# Patient Record
Sex: Male | Born: 1995 | Race: White | Hispanic: No | Marital: Single
Health system: Southern US, Community
[De-identification: ages and names within clinical notes are randomized; demographics above are authoritative.]

## PROBLEM LIST (undated history)

## (undated) DIAGNOSIS — R4189 Other symptoms and signs involving cognitive functions and awareness: Secondary | ICD-10-CM

## (undated) DIAGNOSIS — F909 Attention-deficit hyperactivity disorder, unspecified type: Secondary | ICD-10-CM

## (undated) DIAGNOSIS — F419 Anxiety disorder, unspecified: Secondary | ICD-10-CM

## (undated) DIAGNOSIS — I10 Essential (primary) hypertension: Secondary | ICD-10-CM

## (undated) DIAGNOSIS — F319 Bipolar disorder, unspecified: Secondary | ICD-10-CM

## (undated) DIAGNOSIS — S069X0S Unspecified intracranial injury without loss of consciousness, sequela: Secondary | ICD-10-CM

## (undated) DIAGNOSIS — R413 Other amnesia: Secondary | ICD-10-CM

## (undated) DIAGNOSIS — R569 Unspecified convulsions: Secondary | ICD-10-CM

## (undated) DIAGNOSIS — F068 Other specified mental disorders due to known physiological condition: Secondary | ICD-10-CM

## (undated) DIAGNOSIS — E119 Type 2 diabetes mellitus without complications: Secondary | ICD-10-CM

## (undated) DIAGNOSIS — R51 Headache: Secondary | ICD-10-CM

---
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
## 2015-01-22 ENCOUNTER — Encounter (HOSPITAL_COMMUNITY): Payer: Self-pay | Admitting: Emergency Medicine

## 2015-01-22 ENCOUNTER — Emergency Department (HOSPITAL_COMMUNITY)
Admission: EM | Admit: 2015-01-22 | Discharge: 2015-01-22 | Disposition: A | Discharge status: Home / Self Care | Payer: Medicaid Other | Attending: Emergency Medicine | Admitting: Emergency Medicine

## 2015-01-22 DIAGNOSIS — G40909 Epilepsy, unspecified, not intractable, without status epilepticus: Secondary | ICD-10-CM | POA: Insufficient documentation

## 2015-01-22 DIAGNOSIS — F319 Bipolar disorder, unspecified: Secondary | ICD-10-CM | POA: Diagnosis not present

## 2015-01-22 DIAGNOSIS — Z87820 Personal history of traumatic brain injury: Secondary | ICD-10-CM | POA: Diagnosis not present

## 2015-01-22 DIAGNOSIS — F419 Anxiety disorder, unspecified: Secondary | ICD-10-CM | POA: Insufficient documentation

## 2015-01-22 DIAGNOSIS — Z760 Encounter for issue of repeat prescription: Secondary | ICD-10-CM | POA: Diagnosis not present

## 2015-01-22 HISTORY — DX: Unspecified convulsions: R56.9

## 2015-01-22 MED ORDER — LEVETIRACETAM 500 MG PO TABS: 500.0000 mg | ORAL_TABLET | Freq: Two times a day (BID) | ORAL | Status: DC

## 2015-01-22 MED ORDER — PROPRANOLOL HCL 20 MG PO TABS: 20.0000 mg | ORAL_TABLET | Freq: Two times a day (BID) | ORAL | Status: DC

## 2015-01-22 MED ORDER — DIVALPROEX SODIUM 250 MG PO DR TAB: DELAYED_RELEASE_TABLET | ORAL | Status: DC

## 2015-01-22 NOTE — ED Provider Notes (Signed)
CSN: [id number]     Arrival date & time 01/22/15  1945 History   First MD Initiated Contact with Patient 01/22/15 2010     Chief Complaint  Patient presents with  . Medication Refill     (Consider location/radiation/quality/duration/timing/severity/associated sxs/prior Treatment) The history is provided by the patient and a parent.   Clinton [name] is a 19 y.o. 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  He recently turned 46 so had to switch from his pediatrician to an adult medical provider.  Mother states that his medicaid card issued him this month is wrong, listing a pediatric office he has never been to before.  She has fixed this problem but he will not receive his card for his new provider until 7/1 and in the interim, will run out of his depakote, his propranolol and his keppra tomorrow.  Mother states without these medicines he will definitely start having seizures.  He denies any complaints at this time.     Past Medical History  Diagnosis Date  . ADHD (attention deficit hyperactivity disorder)   . Anxiety   . Cognitive deficit as late effect of traumatic brain injury   . Memory loss, short term   . Headache(784.0)   . Bipolar disorder   . Seizures    Past Surgical History  Procedure Laterality Date  . Craniotomy  09/22/2013    Procedure: Decompressive Craniectomy with ICP monitor placement and bone flap placement into abdomen;  Surgeon: Erline Levine, MD;  Location: [place] [facility];  Service: Neurosurgery;;  Decompressive Craniectomy with ICP monitor placement and bone flap placement into abdomen  . Percutaneous tracheostomy N/A 10/02/2013    Procedure: PERCUTANEOUS TRACHEOSTOMY - BEDSIDE;  Surgeon: Gwenyth Ober, MD;  Location: [place];  Service: General;  Laterality: N/A;  . Esophagogastroduodenoscopy N/A 10/02/2013    Procedure: ESOPHAGOGASTRODUODENOSCOPY  (EGD);  Surgeon: Gwenyth Ober, MD;  Location: [facility] ENDOSCOPY;  Service: General;  Laterality: N/A;  . Peg placement N/A 10/02/2013    Procedure: PERCUTANEOUS ENDOSCOPIC GASTROSTOMY (PEG) PLACEMENT;  Surgeon: Gwenyth Ober, MD;  Location: [facility] END[facility]COPY;  Service: General;  Laterality: N/A;  . Cranioplasty Right 01/19/2014    Procedure: Right Cranioplasty with replacement of bone flap from abdomen;  Surgeon: Erline Levine, MD;  Location: [place] [facility];  Service: Neurosurgery;  Laterality: Right;  Right Cranioplasty with replacement of bone flap from abdomen   History reviewed. No pertinent family history. History  Substance Use Topics  . Smoking status: Current Every Day Smoker -- 0.50 packs/day for .3 years    Types: Cigarettes  . Smokeless tobacco: Never Used  . Alcohol Use: No    Review of Systems  Constitutional: Negative for fever and chills.  HENT: Negative.   Eyes: Negative.  Negative for visual disturbance.  Respiratory: Negative for chest tightness and shortness of breath.   Cardiovascular: Negative for chest pain.  Gastrointestinal: Negative.   Genitourinary: Negative.   Musculoskeletal: Negative.   Skin: Negative.  Negative for rash and wound.  Neurological: Negative for dizziness, weakness, light-headedness, numbness and headaches.  Psychiatric/Behavioral: Negative.       Allergies  Seroquel and Morphine and related  Home Medications   Prior to Admission medications   Medication Sig Start Date End Date Taking? Authorizing Provider  baclofen (LIORESAL) 10 MG tablet Take 1 tablet (10 mg total) by mouth 2 (two) times daily as needed  for muscle spasms. 08/17/14   Bayard Hugger, NP  divalproex (DEPAKOTE) 250 MG DR tablet Take 2 tabs by mouth AM and 3 tabs PM 01/22/15   Evalee Jefferson, PA-C  HYDROcodone-acetaminophen (NORCO/VICODIN) 5-325 MG per tablet Take 1 tablet by mouth every 8 (eight) hours as needed for moderate pain. Patient not taking: Reported on 08/17/2014 06/08/14   Bayard Hugger, NP  levETIRAcetam (KEPPRA) 500 MG tablet Take 1 tablet (500 mg total) by mouth 2 (two) times daily. 01/22/15   Evalee Jefferson, PA-C  LORazepam (ATIVAN) 0.5 MG tablet Take 1 tablet (0.5 mg total) by mouth every 12 (twelve) hours as needed for anxiety. 06/08/14   Bayard Hugger, NP  propranolol (INDERAL) 20 MG tablet Take 1 tablet (20 mg total) by mouth 2 (two) times daily. 01/22/15   Evalee Jefferson, PA-C   BP 94/73 mmHg  Pulse 80  Temp(Src) 98 F (36.7 C) (Oral)  Resp 16  Ht 6' (1.829 m)  Wt 170 lb (77.111 kg)  BMI 23.05 kg/m2  SpO2 100% Physical Exam  Constitutional: He is oriented to person, place, and time. He appears well-developed and well-nourished.  HENT:  Head: Normocephalic and atraumatic.  Eyes: Conjunctivae and EOM are normal. Pupils are equal, round, and reactive to light. No scleral icterus.  Neck: Normal range of motion.  Cardiovascular: Normal rate, regular rhythm and normal heart sounds.   Pulmonary/Chest: Effort normal and breath sounds normal.  Musculoskeletal: Normal range of motion.  Neurological: He is alert and oriented to person, place, and time.  Skin: Skin is warm and dry.  Psychiatric: He has a normal mood and affect.  Nursing note and vitals reviewed.   ED Course  Procedures (including critical care time) Labs Review Labs Reviewed - No data to display  Imaging Review No results found.   EKG Interpretation None      MDM   Final diagnoses:  Medication refill    Pt provided his medications including keppra, depakote and propanolol.  PRN f/u with his new provider [facility] (mother states they are opening a new clinic on [address] in [place]).  Prn here anticipated. Pt in no distress. No complaint of sx at this time.    Evalee Jefferson, PA-C 01/22/15 [place], DO 01/22/15 [id number]

## 2015-01-22 NOTE — Discharge Instructions (Signed)
Medication Refill, Emergency Department °We have refilled your medication today as a courtesy to you. It is best for your medical care, however, to take care of getting refills done through your primary caregiver's office. They have your records and can do a better job of follow-up than we can in the emergency department. °On maintenance medications, we often only prescribe enough medications to get you by until you are able to see your regular caregiver. This is a more expensive way to refill medications. °In the future, please plan for refills so that you will not have to use the emergency department for this. °Thank you for your help. Your help allows us to better take care of the daily emergencies that enter our department. °Document Released: 11/13/2003 Document Revised: 10/19/2011 Document Reviewed: 11/03/2013 °Exit[employer]® Patient Information ©2015 [employer]. This information is not intended to replace advice given to you by your health care provider. Make sure you discuss any questions you have with your health care provider. ° °

## 2015-01-22 NOTE — ED Notes (Signed)
Mother states patient takes keppra, propanolol, depakote daily. States they have been unable to get into doctor's office due to insurance issues. States he only has one day left of medication and if he does not take his medication, he will have seizures. States they will receive new insurance card on July 1.
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
## 2015-04-08 IMAGING — CR DG HAND COMPLETE 3+V*R*
5 series · 5 of 5 positions shown · non-contrast
Comparison: 09/21/2013

CLINICAL DATA: Posterior right hand swelling

EXAM:
RIGHT HAND - COMPLETE 3+ VIEW

[PA]
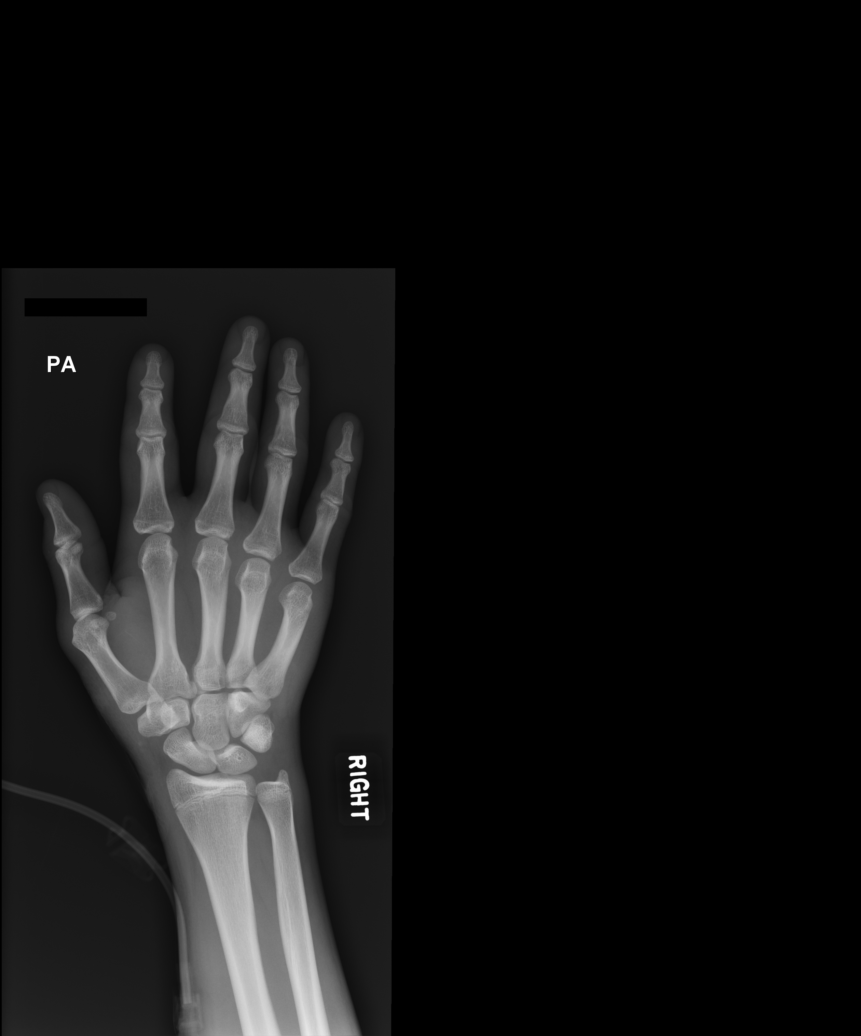

[lateral (1 of 2)]
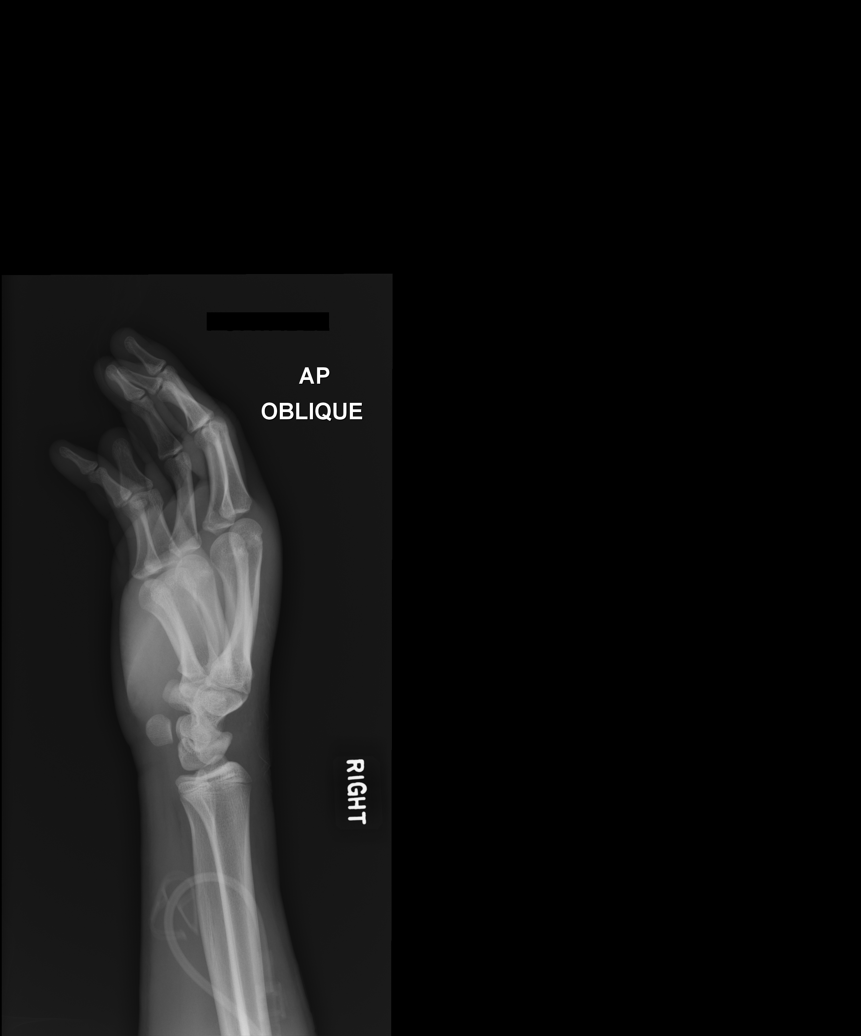

[pa obl (1 of 2)]
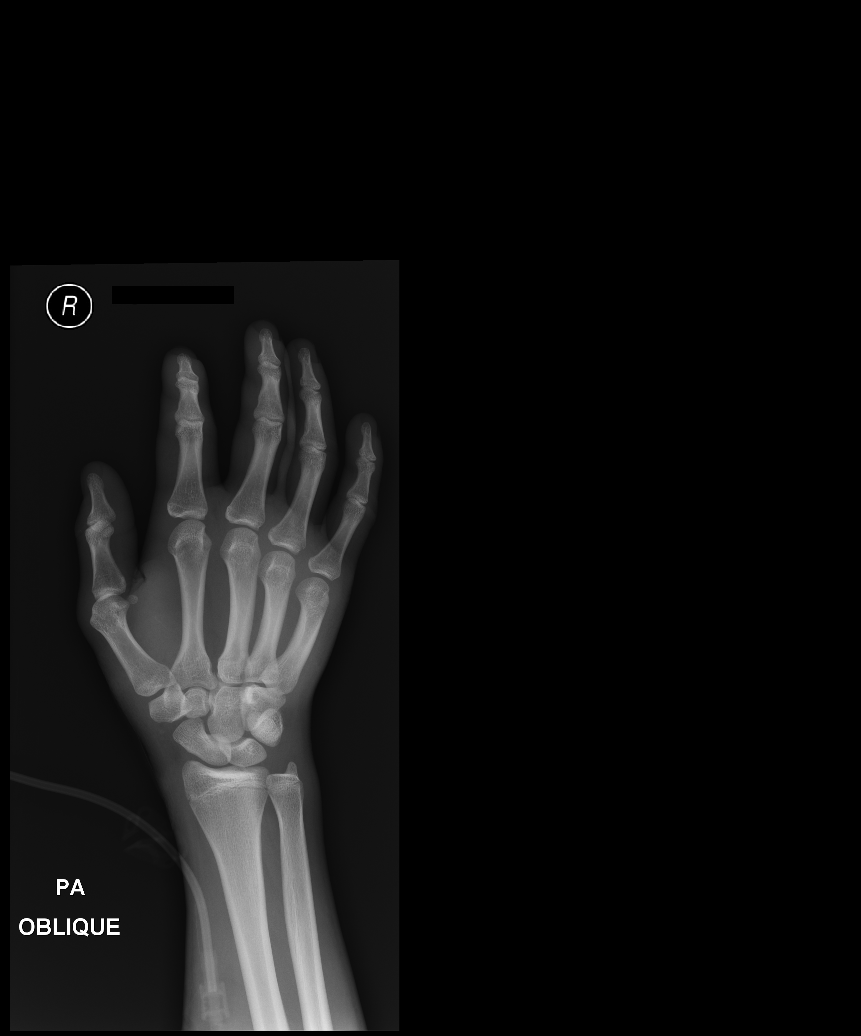

[lateral (2 of 2)]
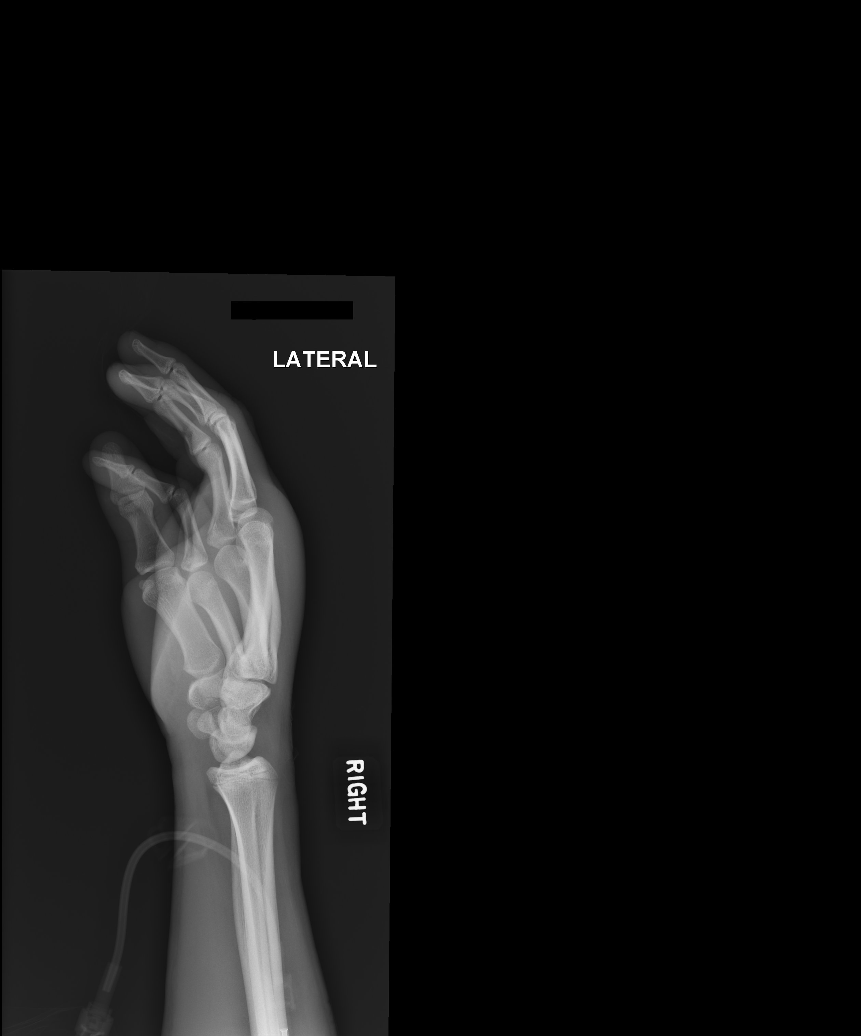

[pa obl (2 of 2)]
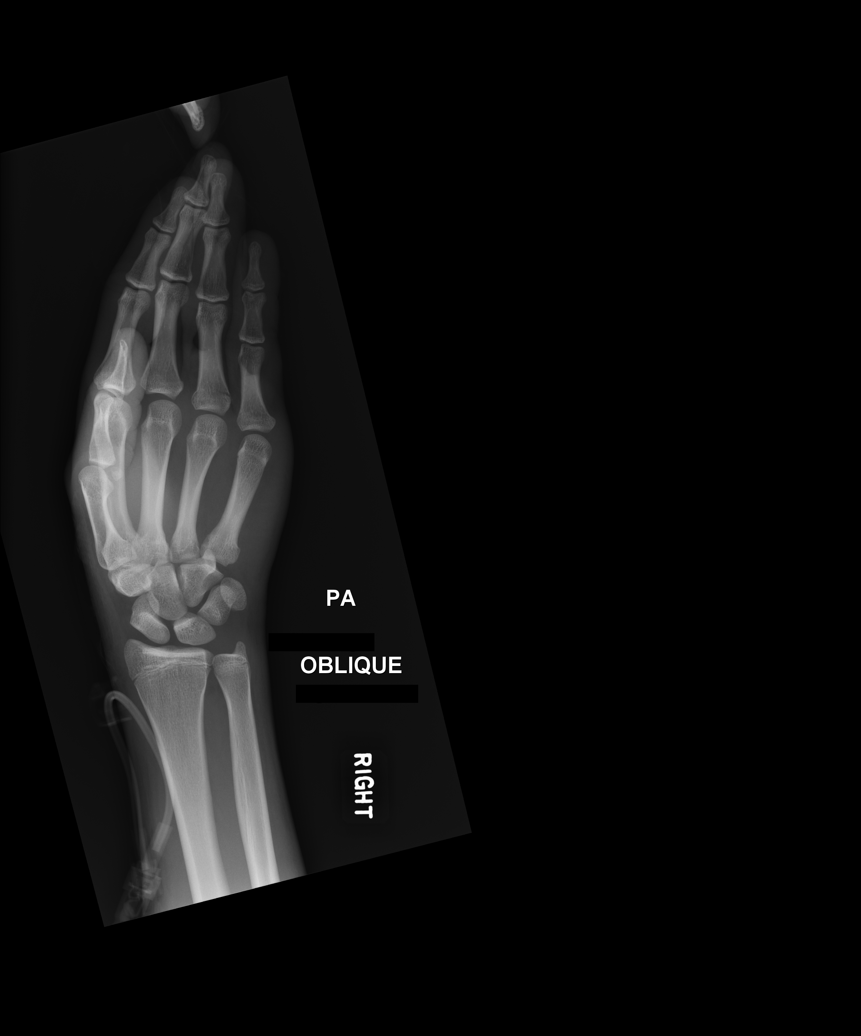

[5 of 5 positions shown; findings below may reference images not displayed]

FINDINGS: Nondisplaced fracture involving the base of the 4th proximal
phalanx, best visualized on the lateral view, unchanged.

No evidence of dislocation.

Moderate dorsal soft tissue swelling.
IMPRESSION: Nondisplaced fracture involving the base of the 4th proximal
phalanx.

No evidence of dislocation.

## 2015-05-16 ENCOUNTER — Telehealth: Payer: Self-pay | Admitting: *Deleted

## 2015-05-16 ENCOUNTER — Ambulatory Visit: Payer: Medicaid Other | Admitting: Neurology

## 2015-05-16 NOTE — Telephone Encounter (Signed)
No showed new patient appt 

## 2015-05-17 ENCOUNTER — Encounter: Payer: Self-pay | Admitting: Neurology

## 2015-07-03 ENCOUNTER — Emergency Department (HOSPITAL_COMMUNITY): Payer: Medicaid Other

## 2015-07-03 ENCOUNTER — Inpatient Hospital Stay (HOSPITAL_COMMUNITY)
Admission: EM | Admit: 2015-07-03 | Discharge: 2015-07-10 | DRG: 983 | Disposition: A | Discharge status: Home Health | Payer: Medicaid Other | Attending: General Surgery | Admitting: General Surgery

## 2015-07-03 ENCOUNTER — Inpatient Hospital Stay (HOSPITAL_COMMUNITY): Payer: Medicaid Other

## 2015-07-03 ENCOUNTER — Inpatient Hospital Stay (HOSPITAL_COMMUNITY): Payer: Medicaid Other | Admitting: Anesthesiology

## 2015-07-03 ENCOUNTER — Encounter (HOSPITAL_COMMUNITY): Admission: EM | Disposition: A | Discharge status: Home Health | Payer: Self-pay | Source: Home / Self Care

## 2015-07-03 ENCOUNTER — Encounter (HOSPITAL_COMMUNITY): Payer: Self-pay | Admitting: Emergency Medicine

## 2015-07-03 DIAGNOSIS — F319 Bipolar disorder, unspecified: Secondary | ICD-10-CM | POA: Diagnosis present

## 2015-07-03 DIAGNOSIS — R40241 Glasgow coma scale score 13-15, unspecified time: Secondary | ICD-10-CM | POA: Diagnosis present

## 2015-07-03 DIAGNOSIS — S065XAA Traumatic subdural hemorrhage with loss of consciousness status unknown, initial encounter: Secondary | ICD-10-CM

## 2015-07-03 DIAGNOSIS — R471 Dysarthria and anarthria: Secondary | ICD-10-CM | POA: Diagnosis present

## 2015-07-03 DIAGNOSIS — S42001A Fracture of unspecified part of right clavicle, initial encounter for closed fracture: Secondary | ICD-10-CM

## 2015-07-03 DIAGNOSIS — F909 Attention-deficit hyperactivity disorder, unspecified type: Secondary | ICD-10-CM | POA: Diagnosis not present

## 2015-07-03 DIAGNOSIS — F419 Anxiety disorder, unspecified: Secondary | ICD-10-CM | POA: Diagnosis not present

## 2015-07-03 DIAGNOSIS — S065X9A Traumatic subdural hemorrhage with loss of consciousness of unspecified duration, initial encounter: Principal | ICD-10-CM | POA: Diagnosis present

## 2015-07-03 DIAGNOSIS — S42009A Fracture of unspecified part of unspecified clavicle, initial encounter for closed fracture: Secondary | ICD-10-CM

## 2015-07-03 DIAGNOSIS — Z79899 Other long term (current) drug therapy: Secondary | ICD-10-CM

## 2015-07-03 DIAGNOSIS — F6381 Intermittent explosive disorder: Secondary | ICD-10-CM

## 2015-07-03 DIAGNOSIS — S42021A Displaced fracture of shaft of right clavicle, initial encounter for closed fracture: Secondary | ICD-10-CM | POA: Diagnosis present

## 2015-07-03 DIAGNOSIS — G3184 Mild cognitive impairment, so stated: Secondary | ICD-10-CM | POA: Diagnosis present

## 2015-07-03 DIAGNOSIS — Z885 Allergy status to narcotic agent status: Secondary | ICD-10-CM | POA: Diagnosis not present

## 2015-07-03 DIAGNOSIS — I6200 Nontraumatic subdural hemorrhage, unspecified: Secondary | ICD-10-CM | POA: Diagnosis present

## 2015-07-03 DIAGNOSIS — Z419 Encounter for procedure for purposes other than remedying health state, unspecified: Secondary | ICD-10-CM

## 2015-07-03 DIAGNOSIS — Z87820 Personal history of traumatic brain injury: Secondary | ICD-10-CM

## 2015-07-03 DIAGNOSIS — R001 Bradycardia, unspecified: Secondary | ICD-10-CM | POA: Diagnosis present

## 2015-07-03 DIAGNOSIS — R561 Post traumatic seizures: Secondary | ICD-10-CM

## 2015-07-03 DIAGNOSIS — F17210 Nicotine dependence, cigarettes, uncomplicated: Secondary | ICD-10-CM | POA: Diagnosis present

## 2015-07-03 DIAGNOSIS — R451 Restlessness and agitation: Secondary | ICD-10-CM | POA: Diagnosis not present

## 2015-07-03 DIAGNOSIS — Z418 Encounter for other procedures for purposes other than remedying health state: Secondary | ICD-10-CM

## 2015-07-03 DIAGNOSIS — S069X5S Unspecified intracranial injury with loss of consciousness greater than 24 hours with return to pre-existing conscious level, sequela: Secondary | ICD-10-CM

## 2015-07-03 LAB — CBC WITH DIFFERENTIAL/PLATELET
BASOS ABS: 0 10*3/uL (ref 0.0–0.1)
BASOS PCT: 0 %
Eosinophils Absolute: 0 10*3/uL (ref 0.0–0.7)
Eosinophils Relative: 0 %
HEMATOCRIT: 38.9 % — AB (ref 39.0–52.0)
HEMOGLOBIN: 13.3 g/dL (ref 13.0–17.0)
Lymphocytes Relative: 13 %
Lymphs Abs: 1.9 10*3/uL (ref 0.7–4.0)
MCH: 31.1 pg (ref 26.0–34.0)
MCHC: 34.2 g/dL (ref 30.0–36.0)
MCV: 91.1 fL (ref 78.0–100.0)
Monocytes Absolute: 0.9 10*3/uL (ref 0.1–1.0)
Monocytes Relative: 6 %
NEUTROS ABS: 11.8 10*3/uL — AB (ref 1.7–7.7)
NEUTROS PCT: 81 %
Platelets: 178 10*3/uL (ref 150–400)
RBC: 4.27 MIL/uL (ref 4.22–5.81)
RDW: 13.2 % (ref 11.5–15.5)
WBC: 14.6 10*3/uL — ABNORMAL HIGH (ref 4.0–10.5)

## 2015-07-03 LAB — PROTIME-INR
INR: 1.22 (ref 0.00–1.49)
Prothrombin Time: 15.6 seconds — ABNORMAL HIGH (ref 11.6–15.2)

## 2015-07-03 LAB — CBC
HCT: 35.4 % — ABNORMAL LOW (ref 39.0–52.0)
Hemoglobin: 11.8 g/dL — ABNORMAL LOW (ref 13.0–17.0)
MCH: 30.5 pg (ref 26.0–34.0)
MCHC: 33.3 g/dL (ref 30.0–36.0)
MCV: 91.5 fL (ref 78.0–100.0)
PLATELETS: 144 10*3/uL — AB (ref 150–400)
RBC: 3.87 MIL/uL — AB (ref 4.22–5.81)
RDW: 13.4 % (ref 11.5–15.5)
WBC: 10 10*3/uL (ref 4.0–10.5)

## 2015-07-03 LAB — MRSA PCR SCREENING: MRSA BY PCR: NEGATIVE

## 2015-07-03 LAB — BASIC METABOLIC PANEL
ANION GAP: 12 (ref 5–15)
ANION GAP: 10 (ref 5–15)
BUN: 20 mg/dL (ref 6–20)
BUN: 15 mg/dL (ref 6–20)
CALCIUM: 10.2 mg/dL (ref 8.9–10.3)
CO2: 28 mmol/L (ref 22–32)
CO2: 28 mmol/L (ref 22–32)
Calcium: 9.3 mg/dL (ref 8.9–10.3)
Chloride: 100 mmol/L — ABNORMAL LOW (ref 101–111)
Chloride: 101 mmol/L (ref 101–111)
Creatinine, Ser: 0.92 mg/dL (ref 0.61–1.24)
Creatinine, Ser: 0.88 mg/dL (ref 0.61–1.24)
GFR calc Af Amer: >60 mL/min (ref >60)
Glucose, Bld: 100 mg/dL — ABNORMAL HIGH (ref 65–99)
Glucose, Bld: 94 mg/dL (ref 65–99)
POTASSIUM: 4.4 mmol/L (ref 3.5–5.1)
Potassium: 4.3 mmol/L (ref 3.5–5.1)
SODIUM: 140 mmol/L (ref 135–145)
SODIUM: 139 mmol/L (ref 135–145)

## 2015-07-03 SURGERY — CRANIOTOMY HEMATOMA EVACUATION SUBDURAL
Anesthesia: General | Laterality: Left

## 2015-07-03 MED ORDER — LIDOCAINE HCL (CARDIAC) 20 MG/ML IV SOLN
INTRAVENOUS | Status: AC
  Filled 2015-07-03: qty 5

## 2015-07-03 MED ORDER — ARTIFICIAL TEARS OP OINT
TOPICAL_OINTMENT | OPHTHALMIC | Status: AC
Start: 2015-07-03 — End: 2015-07-03
  Filled 2015-07-03: qty 3.5

## 2015-07-03 MED ORDER — FENTANYL CITRATE (PF) 250 MCG/5ML IJ SOLN
INTRAMUSCULAR | Status: AC
  Filled 2015-07-03: qty 5

## 2015-07-03 MED ORDER — FENTANYL CITRATE (PF) 100 MCG/2ML IJ SOLN
50.0000 ug | Freq: Once | INTRAMUSCULAR | Status: AC
  Administered 2015-07-03: 50 ug via INTRAVENOUS
  Filled 2015-07-03: qty 2

## 2015-07-03 MED ORDER — DIVALPROEX SODIUM 500 MG PO DR TAB
750.0000 mg | DELAYED_RELEASE_TABLET | Freq: Every day | ORAL | Status: DC
  Administered 2015-07-03 – 2015-07-09 (×7): 750 mg via ORAL
  Filled 2015-07-03 (×9): qty 1

## 2015-07-03 MED ORDER — PROPRANOLOL HCL 20 MG PO TABS
20.0000 mg | ORAL_TABLET | Freq: Two times a day (BID) | ORAL | Status: DC
  Administered 2015-07-04 – 2015-07-10 (×11): 20 mg via ORAL
  Filled 2015-07-03 (×18): qty 1

## 2015-07-03 MED ORDER — ONDANSETRON HCL 4 MG/2ML IJ SOLN
4.0000 mg | Freq: Once | INTRAMUSCULAR | Status: AC
  Administered 2015-07-03: 4 mg via INTRAMUSCULAR
  Filled 2015-07-03: qty 2

## 2015-07-03 MED ORDER — PANTOPRAZOLE SODIUM 40 MG PO TBEC
40.0000 mg | DELAYED_RELEASE_TABLET | Freq: Every day | ORAL | Status: DC
  Administered 2015-07-04 – 2015-07-10 (×6): 40 mg via ORAL
  Filled 2015-07-03 (×6): qty 1

## 2015-07-03 MED ORDER — SODIUM CHLORIDE 0.9 % IV SOLN
INTRAVENOUS | Status: DC
  Administered 2015-07-03: 03:00:00 via INTRAVENOUS

## 2015-07-03 MED ORDER — FENTANYL CITRATE (PF) 100 MCG/2ML IJ SOLN
25.0000 ug | INTRAMUSCULAR | Status: DC | PRN
  Administered 2015-07-03 – 2015-07-06 (×5): 25 ug via INTRAVENOUS
  Filled 2015-07-03 (×5): qty 2

## 2015-07-03 MED ORDER — ONDANSETRON HCL 4 MG/2ML IJ SOLN
4.0000 mg | Freq: Four times a day (QID) | INTRAMUSCULAR | Status: DC | PRN
  Administered 2015-07-03 – 2015-07-08 (×9): 4 mg via INTRAVENOUS
  Filled 2015-07-03 (×9): qty 2

## 2015-07-03 MED ORDER — CHLORHEXIDINE GLUCONATE 0.12 % MT SOLN
15.0000 mL | Freq: Two times a day (BID) | OROMUCOSAL | Status: DC
  Administered 2015-07-03 – 2015-07-05 (×3): 15 mL via OROMUCOSAL
  Filled 2015-07-03 (×2): qty 15

## 2015-07-03 MED ORDER — PANTOPRAZOLE SODIUM 40 MG IV SOLR
40.0000 mg | Freq: Every day | INTRAVENOUS | Status: DC
  Administered 2015-07-03 – 2015-07-05 (×2): 40 mg via INTRAVENOUS
  Filled 2015-07-03 (×2): qty 40

## 2015-07-03 MED ORDER — LORAZEPAM 0.5 MG PO TABS
0.5000 mg | ORAL_TABLET | Freq: Two times a day (BID) | ORAL | Status: DC | PRN
  Administered 2015-07-03 – 2015-07-07 (×4): 0.5 mg via ORAL
  Filled 2015-07-03 (×4): qty 1

## 2015-07-03 MED ORDER — CETYLPYRIDINIUM CHLORIDE 0.05 % MT LIQD
7.0000 mL | Freq: Two times a day (BID) | OROMUCOSAL | Status: DC
  Administered 2015-07-03 – 2015-07-05 (×4): 7 mL via OROMUCOSAL

## 2015-07-03 MED ORDER — DIVALPROEX SODIUM 125 MG PO DR TAB
125.0000 mg | DELAYED_RELEASE_TABLET | Freq: Two times a day (BID) | ORAL | Status: DC
  Administered 2015-07-03: 125 mg via ORAL
  Filled 2015-07-03 (×2): qty 1

## 2015-07-03 MED ORDER — POTASSIUM CHLORIDE IN NACL 20-0.9 MEQ/L-% IV SOLN
INTRAVENOUS | Status: DC
  Administered 2015-07-03 – 2015-07-04 (×3): via INTRAVENOUS
  Filled 2015-07-03 (×5): qty 1000

## 2015-07-03 MED ORDER — DIVALPROEX SODIUM 500 MG PO DR TAB
500.0000 mg | DELAYED_RELEASE_TABLET | Freq: Every day | ORAL | Status: DC
  Administered 2015-07-04 – 2015-07-10 (×7): 500 mg via ORAL
  Filled 2015-07-03 (×10): qty 1

## 2015-07-03 MED ORDER — IOHEXOL 300 MG/ML  SOLN
75.0000 mL | Freq: Once | INTRAMUSCULAR | Status: AC | PRN
  Administered 2015-07-03: 75 mL via INTRAVENOUS

## 2015-07-03 MED ORDER — NEOSTIGMINE METHYLSULFATE 10 MG/10ML IV SOLN
INTRAVENOUS | Status: AC
  Filled 2015-07-03: qty 3

## 2015-07-03 MED ORDER — ACETAMINOPHEN 325 MG PO TABS
650.0000 mg | ORAL_TABLET | ORAL | Status: DC | PRN
  Administered 2015-07-04 – 2015-07-09 (×6): 650 mg via ORAL
  Filled 2015-07-03 (×6): qty 2

## 2015-07-03 MED ORDER — LEVETIRACETAM 500 MG PO TABS
500.0000 mg | ORAL_TABLET | Freq: Two times a day (BID) | ORAL | Status: DC
  Administered 2015-07-03 – 2015-07-10 (×15): 500 mg via ORAL
  Filled 2015-07-03 (×15): qty 1

## 2015-07-03 MED ORDER — ONDANSETRON HCL 4 MG PO TABS
4.0000 mg | ORAL_TABLET | Freq: Four times a day (QID) | ORAL | Status: DC | PRN
  Administered 2015-07-09 – 2015-07-10 (×2): 4 mg via ORAL
  Filled 2015-07-03 (×2): qty 1

## 2015-07-03 MED ORDER — PROPOFOL 10 MG/ML IV BOLUS
INTRAVENOUS | Status: AC
  Filled 2015-07-03: qty 20
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
## 2015-07-03 NOTE — Anesthesia Preprocedure Evaluation (Deleted)
Anesthesia Evaluation    History of Anesthesia Complications Negative for: history of anesthetic complications  Airway        Dental   Pulmonary Current Smoker,           Cardiovascular negative cardio ROS       Neuro/Psych Seizures -,  PSYCHIATRIC DISORDERS Anxiety Bipolar Disorder    GI/Hepatic negative GI ROS, Neg liver ROS,   Endo/Other  negative endocrine ROS  Renal/GU negative Renal ROS     Musculoskeletal   Abdominal   Peds  Hematology   Anesthesia Other Findings   Reproductive/Obstetrics                             Anesthesia Physical Anesthesia Plan  ASA: III and emergent  Anesthesia Plan: General   Post-op Pain Management:    Induction: Intravenous  Airway Management Planned: Oral ETT  Additional Equipment:   Intra-op Plan:   Post-operative Plan: Post-operative intubation/ventilation  Informed Consent:   Plan Discussed with:   Anesthesia Plan Comments:         Anesthesia Quick Evaluation
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
## 2015-07-03 NOTE — Progress Notes (Signed)
Utilization review completed. Sherrian Nunnelley, RN, BSN. 

## 2015-07-03 NOTE — Progress Notes (Signed)
Patient somnolent, arouses with vigorous stimulation and swears.  I have reviewed patient's imaging and his clinical situation.  I agree with Dr.  Arnoldo Morale that patient should have craniotomy for SDH.  Social work consult and home safety evaluation would also be appropriate.

## 2015-07-03 NOTE — ED Notes (Signed)
Mother Contact numberLondyn [name] [phone].

## 2015-07-03 NOTE — Progress Notes (Addendum)
SLP Cancellation Note  Patient Details Name: Clinton [name] MRN: [id number] DOB: [birth year]   Cancelled treatment:       Reason Eval/Treat Not Completed: Medical issues which prohibited therapy. Note plans for craniotomy. Would recommend holding evaluation until after surgery.   [place], CCC-SLP [phone]    Gabriel Rainwater Meryl 07/03/2015, 9:59 AM

## 2015-07-03 NOTE — Progress Notes (Signed)
Dr. Trenton Gammon notified of patient's arrival. RN will monitor.
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
## 2015-07-03 NOTE — Progress Notes (Signed)
PT Cancellation Note  Patient Details Name: Clinton [name] MRN: [id number] DOB: 31-May-1996   Cancelled Treatment:    Reason Eval/Treat Not Completed: Patient not medically ready.  Noted plan for Crani and repair of R Clavicle fx.  Will hold PT eval at this time and f/u as appropriate.  Will need new orders and activity order post Crani.     Catarina Hartshorn, [place] 07/03/2015, 9:38 AM

## 2015-07-03 NOTE — Progress Notes (Signed)
I spoke with the patient's mother and stepfather. The patient has improved clinically since his admission. I have recommended we continue to observe him and plan to repeat his CAT scan this afternoon. If the patient remains clinically stable and his CAT scan is unchanged we will plan continued observation. If the patient worsens neurologically or the CAT scans worsen then we will likely do surgery. I have answered all their questions.

## 2015-07-03 NOTE — Progress Notes (Signed)
OT Cancellation Note  Patient Details Name: Clinton [name] MRN: [id number] DOB: 1996-05-24   Cancelled Treatment:    Reason Eval/Treat Not Completed: Patient not medically ready. Pt expected to undergo craniotomy and R clavicle ORIF. Will follow.  Malka So 07/03/2015, 10:08 AM
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
## 2015-07-04 NOTE — Progress Notes (Signed)
Patient will not keep blood pressure cuff on and removes it before a blood pressure can be completed. Have gotten blood pressure readings as the patient allows.

## 2015-07-04 NOTE — Progress Notes (Signed)
Patient agitated, yelling at RN and getting out of bed more frequently without assistance. Mother at bedside and unable to calm patient down. MD Kinsinger notified. Orders received for a Posey belt and to obatin a CT in the AM as ordered.
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
## 2015-07-04 NOTE — Progress Notes (Signed)
Patient ID: Clinton [name], male   DOB: [birth year], 19 y.o.   MRN: [id number] He is awake and alert. He follows commands. He moves all extremities well. He will stand up at the side of bed to urinate. He has some mild dysarthria which is probably baseline. Mild left facial dysfunction which is probably baseline. Overall he appears to be doing well. Repeat CT tomorrow.

## 2015-07-05 ENCOUNTER — Inpatient Hospital Stay (HOSPITAL_COMMUNITY): Payer: Medicaid Other

## 2015-07-05 MED ORDER — PROMETHAZINE HCL 25 MG PO TABS
12.5000 mg | ORAL_TABLET | Freq: Four times a day (QID) | ORAL | Status: DC | PRN
  Administered 2015-07-05: 12.5 mg via ORAL
  Filled 2015-07-05: qty 1

## 2015-07-05 NOTE — Progress Notes (Signed)
Text paged MD Kinsinger that CT head results are in

## 2015-07-05 NOTE — Progress Notes (Signed)
Patient ID: Clinton [name], male   DOB: [birth year], 19 y.o.   MRN: [id number] Patient is resting comfortably. He arouses easily and follows commands. He denies significant headache. He moves all extremities. Head CT looks stable to me with stable left subdural hematoma and left temporal contusion

## 2015-07-05 NOTE — Progress Notes (Signed)
Patient extremely agitated at this time and unable to participate in exam. Note plans for surgery next week.  Will follow up Monday to complete formal exam.
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
## 2015-07-05 NOTE — Progress Notes (Signed)
Patient ID: Clinton [name], male   DOB: Jan 17, 1996, 19 y.o.   MRN: [id number]  Mother at bedside. Because of his agitation, she wants her son to stay in the ICU which I feel is totally reasonable given his previous brain injury as well.
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
## 2015-07-05 NOTE — Progress Notes (Signed)
Rehab Admissions Coordinator Note:  Patient was screened by Retta Diones for appropriateness for an Inpatient Acute Rehab Consult.  At this time, we are recommending Inpatient Rehab consult.  Retta Diones 07/05/2015, 1:49 PM  I can be reached at [phone].

## 2015-07-06 MED ORDER — LORAZEPAM 1 MG PO TABS
1.0000 mg | ORAL_TABLET | ORAL | Status: DC | PRN
  Administered 2015-07-06 – 2015-07-10 (×7): 1 mg via ORAL
  Filled 2015-07-06 (×8): qty 1

## 2015-07-06 MED ORDER — OXYCODONE HCL 5 MG PO TABS
10.0000 mg | ORAL_TABLET | ORAL | Status: DC | PRN
Start: 2015-07-06 — End: 2015-07-10
  Administered 2015-07-07 – 2015-07-10 (×9): 10 mg via ORAL
  Filled 2015-07-06 (×11): qty 2

## 2015-07-06 NOTE — Progress Notes (Signed)
Patient admitted to 5M13. Patient is oriented to self, will not answer other questions regarding orientation. Laceration to head is closed, dry, intact. No restraints present. Patient refuses to wear SCDs. Sitter at bedside, patient is resting quietly in bed. Will continue to monitor closely.

## 2015-07-06 NOTE — Progress Notes (Signed)
Trauma Service Note  Subjective: Continues to be uncooperative.  Objective: Vital signs in last 24 hours: Temp:  [97.5 F (36.4 C)-98.5 F (36.9 C)] 97.5 F (36.4 C) (11/26 0800) Pulse Rate:  [47-75] 47 (11/26 0800) Resp:  [14-21] 19 (11/26 0800) BP: (102-127)/(52-58) 102/55 mmHg (11/26 0800) SpO2:  [95 %-100 %] 95 % (11/26 0800)    Intake/Output from previous day: 11/25 0701 - 11/26 0700 In: 600 [P.O.:600] Out: 275 [Urine:275] Intake/Output this shift: Total I/O In: -  Out: 50 [Urine:50]  General: NAD  Lungs: CTAB  Abd: soft, NT, ND  Extremities: moves all extremities  Neuro: AOx4, some slurred speech  Lab Results: CBC  No results for input(s): WBC, HGB, HCT, PLT in the last 72 hours. BMET No results for input(s): NA, K, CL, CO2, GLUCOSE, BUN, CREATININE, CALCIUM in the last 72 hours. PT/INR No results for input(s): LABPROT, INR in the last 72 hours. ABG No results for input(s): PHART, HCO3 in the last 72 hours.  Invalid input(s): PCO2, PO2  Studies/Results: No results found.  Anti-infectives: Anti-infectives    None      Medications Scheduled Meds: . antiseptic oral rinse  7 mL Mouth Rinse q12n4p  . chlorhexidine  15 mL Mouth Rinse BID  . divalproex  500 mg Oral Daily   And  . divalproex  750 mg Oral QHS  . levETIRAcetam  500 mg Oral BID  . pantoprazole  40 mg Oral Daily  . propranolol  20 mg Oral BID   Continuous Infusions:  PRN Meds:.acetaminophen, fentaNYL (SUBLIMAZE) injection, LORazepam, ondansetron **OR** ondansetron (ZOFRAN) IV, promethazine  Assessment/Plan: s/p Procedure(s): CRANIOTOMY HEMATOMA EVACUATION  -transfer to floor -advance diet -window open during day -up to chair with assistance   LOS: 3 days   Silvana Surgeon [phone] Surgery 07/06/2015

## 2015-07-06 NOTE — Progress Notes (Signed)
Patient ID: Clinton [name], male   DOB: 1996-02-27, 19 y.o.   MRN: [id number] He is stable. He arouses easily and answers questions. He moves all extremities. Will transfer to the floor today.

## 2015-07-06 NOTE — Progress Notes (Signed)
Spoke with mother at length to update her on his condition. Discussed that due to 48h of critical monitoring without neuro changes and CT done 11/25 showing no change from previous that he is appropriate for floor and should start with increased therapy.  She stated she felt the neurosurgeon today was short and feels that she is not in the loop.   I spoke with Dr. Percell Miller of ortho and patient is still scheduled for surgery 11/29, but could be done as outpatient if meets criteria for discharge.

## 2015-07-07 NOTE — Progress Notes (Signed)
4 Days Post-Op  Subjective: Awake and alert. Complains of pain right clavicle area  Objective: Vital signs in last 24 hours: Temp:  [97.7 F (36.5 C)-98.6 F (37 C)] 97.8 F (36.6 C) (11/27 0536) Pulse Rate:  [52-101] 101 (11/27 0536) Resp:  [18-20] 20 (11/27 0536) BP: (104-135)/(53-80) 107/69 mmHg (11/27 0536) SpO2:  [98 %-100 %] 100 % (11/27 0536)    Intake/Output from previous day: 11/26 0701 - 11/27 0700 In: 110 [P.O.:110] Out: 50 [Urine:50] Intake/Output this shift:    Resp: clear to auscultation bilaterally Cardio: regular rate and rhythm GI: soft, nontender Neurologic: Motor: moves all 4 extr and follows commands  Lab Results:  No results for input(s): WBC, HGB, HCT, PLT in the last 72 hours. BMET No results for input(s): NA, K, CL, CO2, GLUCOSE, BUN, CREATININE, CALCIUM in the last 72 hours. PT/INR No results for input(s): LABPROT, INR in the last 72 hours. ABG No results for input(s): PHART, HCO3 in the last 72 hours.  Invalid input(s): PCO2, PO2  Studies/Results: No results found.  Anti-infectives: Anti-infectives    None      Assessment/Plan: s/p Procedure(s): CRANIOTOMY HEMATOMA EVACUATION  (Left) SDH per NSU  Clavicle fx per ortho PT  LOS: 4 days    TOTH [name],Clinton Cockerell S 07/07/2015
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
## 2015-07-07 NOTE — Progress Notes (Signed)
Patient ID: Clinton [name], male   DOB: February 14, 1996, 19 y.o.   MRN: [id number] Patient awakens easily to voice responds appropriately neurologically nonfocal  Continue mobilization with therapy

## 2015-07-08 DIAGNOSIS — S42001A Fracture of unspecified part of right clavicle, initial encounter for closed fracture: Secondary | ICD-10-CM | POA: Diagnosis present
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
## 2015-07-08 NOTE — Progress Notes (Signed)
Patient ID: Clinton [name], male   DOB: May 25, 1996, 19 y.o.   MRN: [id number]   LOS: 5 days   Subjective: No c/o, impulsive   Objective: Vital signs in last 24 hours: Temp:  [97.7 F (36.5 C)-98.6 F (37 C)] 98.2 F (36.8 C) (11/28 0603) Pulse Rate:  [64-80] 80 (11/28 0603) Resp:  [17-18] 18 (11/28 0603) BP: (99-109)/(49-91) 109/91 mmHg (11/28 0603) SpO2:  [99 %-100 %] 100 % (11/28 0603)    Physical Exam General appearance: alert and no distress Resp: clear to auscultation bilaterally Cardio: regular rate and rhythm GI: normal findings: bowel sounds normal and soft, non-tender   Assessment/Plan: BCC TBI/L SDH-appreciate NSU follow up.Initiallly recommending craniotomy, mother initially refusing, then agreeable but as pt improving not done. TBI team. Family/pt notes trouble hearing, will need OP f/u with ENT Right clavicle fracture-Dr. Percell Miller following, recommend surgery, planned for tomorrow Hx severe TBI 2015 FEN - No issues VTE - SCD's Dispo -- Recommending CIR, unsure if that's a possibility    Lisette Abu, PA-C Pager: [phone] General Trauma PA Pager: [phone]  07/08/2015
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
## 2015-07-08 NOTE — Progress Notes (Signed)
Patient ID: Clinton [name], male   DOB: 04/01/1996, 19 y.o.   MRN: [id number] Subjective:  The patient is alert and pleasant. His mother is at the bedside. According to her the patient is at his baseline. He is scheduled for collarbone surgery tomorrow.  Objective: Vital signs in last 24 hours: Temp:  [98.2 F (36.8 C)-98.6 F (37 C)] 98.4 F (36.9 C) (11/28 1012) Pulse Rate:  [64-82] 82 (11/28 1012) Resp:  [17-18] 18 (11/28 1012) BP: (99-116)/(53-91) 116/78 mmHg (11/28 1012) SpO2:  [100 %] 100 % (11/28 1012)  Intake/Output from previous day:   Intake/Output this shift: Total I/O In: 1200 [P.O.:1200] Out: -   Physical exam the patient is alert and oriented 3. He is moving all 4 extremities. He is pleasant.  Lab Results: No results for input(s): WBC, HGB, HCT, PLT in the last 72 hours. BMET No results for input(s): NA, K, CL, CO2, GLUCOSE, BUN, CREATININE, CALCIUM in the last 72 hours.  Studies/Results: No results found.  Assessment/Plan: Left subdural hematoma, left temporal contusion, traumatic brain injury: The patient is neurologically stable. From my point of view he can be discharged and follow-up with me in the office in a week or 2. I discussed situation with the patient and his mother and answered all their questions.  LOS: 5 days     Clinton [name] D 07/08/2015, 6:37 PM
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
## 2015-07-08 NOTE — Progress Notes (Signed)
I met with pt, his Mom and his Dad at bedside. I discussed our recommendation of OP therapy at this level. They prefer Home health because Mom is legally blind and Father works so transportation is an issue. I have discussed with Dr. Naaman Plummer. I will update RN CM to arrange. [phone]

## 2015-07-09 ENCOUNTER — Inpatient Hospital Stay (HOSPITAL_COMMUNITY): Payer: Medicaid Other | Admitting: Anesthesiology

## 2015-07-09 ENCOUNTER — Encounter (HOSPITAL_COMMUNITY): Payer: Self-pay | Admitting: Certified Registered Nurse Anesthetist

## 2015-07-09 ENCOUNTER — Inpatient Hospital Stay (HOSPITAL_COMMUNITY): Payer: Medicaid Other

## 2015-07-09 ENCOUNTER — Encounter (HOSPITAL_COMMUNITY): Admission: EM | Disposition: A | Discharge status: Home Health | Payer: Self-pay | Source: Home / Self Care

## 2015-07-09 HISTORY — PX: ORIF CLAVICULAR FRACTURE: SHX5055

## 2015-07-09 SURGERY — OPEN REDUCTION INTERNAL FIXATION (ORIF) CLAVICULAR FRACTURE
Anesthesia: General | Laterality: Right

## 2015-07-09 MED ORDER — HYDROMORPHONE HCL 1 MG/ML IJ SOLN
INTRAMUSCULAR | Status: AC
  Administered 2015-07-09: 0.5 mg via INTRAVENOUS
  Filled 2015-07-09: qty 1

## 2015-07-09 MED ORDER — CEFAZOLIN SODIUM-DEXTROSE 2-3 GM-% IV SOLR
2.0000 g | INTRAVENOUS | Status: AC
  Administered 2015-07-09: 2 g via INTRAVENOUS

## 2015-07-09 MED ORDER — OXYCODONE HCL 5 MG/5ML PO SOLN: 5.0000 mg | Freq: Once | ORAL | Status: DC | PRN

## 2015-07-09 MED ORDER — MIDAZOLAM HCL 2 MG/2ML IJ SOLN
INTRAMUSCULAR | Status: AC
  Filled 2015-07-09: qty 2

## 2015-07-09 MED ORDER — PROPOFOL 10 MG/ML IV BOLUS
INTRAVENOUS | Status: DC | PRN
  Administered 2015-07-09: 50 mg via INTRAVENOUS
  Administered 2015-07-09: 100 mg via INTRAVENOUS

## 2015-07-09 MED ORDER — PROMETHAZINE HCL 25 MG/ML IJ SOLN: 6.2500 mg | INTRAMUSCULAR | Status: DC | PRN

## 2015-07-09 MED ORDER — LACTATED RINGERS IV SOLN: INTRAVENOUS | Status: DC

## 2015-07-09 MED ORDER — BUPIVACAINE HCL (PF) 0.5 % IJ SOLN
INTRAMUSCULAR | Status: DC | PRN
  Administered 2015-07-09: 30 mL

## 2015-07-09 MED ORDER — FENTANYL CITRATE (PF) 250 MCG/5ML IJ SOLN
INTRAMUSCULAR | Status: AC
  Filled 2015-07-09: qty 5

## 2015-07-09 MED ORDER — BUPIVACAINE HCL (PF) 0.5 % IJ SOLN
INTRAMUSCULAR | Status: AC
  Filled 2015-07-09: qty 30

## 2015-07-09 MED ORDER — GLYCOPYRROLATE 0.2 MG/ML IJ SOLN
INTRAMUSCULAR | Status: AC
  Filled 2015-07-09: qty 2

## 2015-07-09 MED ORDER — LACTATED RINGERS IV SOLN
INTRAVENOUS | Status: DC | PRN
  Administered 2015-07-09 (×2): via INTRAVENOUS

## 2015-07-09 MED ORDER — 0.9 % SODIUM CHLORIDE (POUR BTL) OPTIME
TOPICAL | Status: DC | PRN
  Administered 2015-07-09: 1000 mL

## 2015-07-09 MED ORDER — BUPIVACAINE-EPINEPHRINE (PF) 0.5% -1:200000 IJ SOLN
INTRAMUSCULAR | Status: AC
  Filled 2015-07-09: qty 30

## 2015-07-09 MED ORDER — ONDANSETRON HCL 4 MG/2ML IJ SOLN
INTRAMUSCULAR | Status: DC | PRN
  Administered 2015-07-09: 4 mg via INTRAVENOUS

## 2015-07-09 MED ORDER — CEFAZOLIN SODIUM-DEXTROSE 2-3 GM-% IV SOLR
INTRAVENOUS | Status: AC
  Filled 2015-07-09: qty 50

## 2015-07-09 MED ORDER — GLYCOPYRROLATE 0.2 MG/ML IJ SOLN
INTRAMUSCULAR | Status: DC | PRN
  Administered 2015-07-09: 0.4 mg via INTRAVENOUS

## 2015-07-09 MED ORDER — POTASSIUM CHLORIDE IN NACL 20-0.45 MEQ/L-% IV SOLN
INTRAVENOUS | Status: DC
  Filled 2015-07-09: qty 1000

## 2015-07-09 MED ORDER — OXYCODONE HCL 5 MG PO TABS: 5.0000 mg | ORAL_TABLET | Freq: Once | ORAL | Status: DC | PRN

## 2015-07-09 MED ORDER — NEOSTIGMINE METHYLSULFATE 10 MG/10ML IV SOLN
INTRAVENOUS | Status: DC | PRN
  Administered 2015-07-09: 3 mg via INTRAVENOUS

## 2015-07-09 MED ORDER — ACETAMINOPHEN 500 MG PO TABS: 1000.0000 mg | ORAL_TABLET | Freq: Once | ORAL | Status: DC

## 2015-07-09 MED ORDER — LACTATED RINGERS IV SOLN
INTRAVENOUS | Status: DC
  Administered 2015-07-09: 10:00:00 via INTRAVENOUS

## 2015-07-09 MED ORDER — CHLORHEXIDINE GLUCONATE 4 % EX LIQD: 60.0000 mL | Freq: Once | CUTANEOUS | Status: DC

## 2015-07-09 MED ORDER — FENTANYL CITRATE (PF) 100 MCG/2ML IJ SOLN
INTRAMUSCULAR | Status: DC | PRN
  Administered 2015-07-09 (×2): 50 ug via INTRAVENOUS

## 2015-07-09 MED ORDER — ONDANSETRON HCL 4 MG/2ML IJ SOLN
INTRAMUSCULAR | Status: AC
  Filled 2015-07-09: qty 2

## 2015-07-09 MED ORDER — LIDOCAINE HCL (CARDIAC) 20 MG/ML IV SOLN
INTRAVENOUS | Status: DC | PRN
  Administered 2015-07-09: 60 mg via INTRAVENOUS

## 2015-07-09 MED ORDER — DOCUSATE SODIUM 100 MG PO CAPS
100.0000 mg | ORAL_CAPSULE | Freq: Two times a day (BID) | ORAL | Status: DC
  Administered 2015-07-09 – 2015-07-10 (×2): 100 mg via ORAL
  Filled 2015-07-09 (×2): qty 1

## 2015-07-09 MED ORDER — ROCURONIUM BROMIDE 100 MG/10ML IV SOLN
INTRAVENOUS | Status: DC | PRN
  Administered 2015-07-09: 30 mg via INTRAVENOUS

## 2015-07-09 MED ORDER — HYDROMORPHONE HCL 1 MG/ML IJ SOLN
0.2500 mg | INTRAMUSCULAR | Status: DC | PRN
Start: 2015-07-09 — End: 2015-07-09
  Administered 2015-07-09 (×2): 0.5 mg via INTRAVENOUS

## 2015-07-09 MED ORDER — CEFAZOLIN SODIUM 1-5 GM-% IV SOLN
1.0000 g | Freq: Four times a day (QID) | INTRAVENOUS | Status: AC
  Administered 2015-07-09 – 2015-07-10 (×2): 1 g via INTRAVENOUS
  Filled 2015-07-09 (×3): qty 50

## 2015-07-09 SURGICAL SUPPLY — 56 items
BIT DRILL 2.0 (BIT) ×2 IMPLANT
BIT DRILL 2.6 (BIT) ×2 IMPLANT
BIT DRILL Q COUPLING 4.5 (BIT) IMPLANT
BIT DRILL Q/COUPLING 1 (BIT) IMPLANT
CLOSURE STERI-STRIP 1/2X4 (GAUZE/BANDAGES/DRESSINGS) ×1
CLOSURE WOUND 1/2 X4 (GAUZE/BANDAGES/DRESSINGS) ×2
CLSR STERI-STRIP ANTIMIC 1/2X4 (GAUZE/BANDAGES/DRESSINGS) ×1 IMPLANT
COUNTERSINK (MISCELLANEOUS) ×3
COVER SURGICAL LIGHT HANDLE (MISCELLANEOUS) ×4 IMPLANT
DRAPE C-ARM 42X72 X-RAY (DRAPES) ×3 IMPLANT
DRAPE IMP U-DRAPE 54X76 (DRAPES) ×3 IMPLANT
DRAPE U-SHAPE 47X51 STRL (DRAPES) ×6 IMPLANT
DRILL 2.6X220MM LONG AO (BIT) ×2 IMPLANT
DRILL OVER 2.7X220 (BIT) ×2 IMPLANT
DRSG EMULSION OIL 3X3 NADH (GAUZE/BANDAGES/DRESSINGS) ×3 IMPLANT
DRSG MEPILEX BORDER 4X8 (GAUZE/BANDAGES/DRESSINGS) ×3 IMPLANT
DURAPREP 26ML APPLICATOR (WOUND CARE) ×3 IMPLANT
ELECT REM PT RETURN 9FT ADLT (ELECTROSURGICAL) ×3
ELECTRODE REM PT RTRN 9FT ADLT (ELECTROSURGICAL) ×1 IMPLANT
GAUZE SPONGE 4X4 12PLY STRL (GAUZE/BANDAGES/DRESSINGS) ×3 IMPLANT
GLOVE BIO SURGEON STRL SZ7 (GLOVE) ×5 IMPLANT
GLOVE BIO SURGEON STRL SZ7.5 (GLOVE) ×3 IMPLANT
GLOVE BIOGEL PI IND STRL 6.5 (GLOVE) IMPLANT
GLOVE BIOGEL PI IND STRL 7.0 (GLOVE) ×1 IMPLANT
GLOVE BIOGEL PI IND STRL 8 (GLOVE) ×1 IMPLANT
GLOVE BIOGEL PI INDICATOR 6.5 (GLOVE) ×4
GLOVE BIOGEL PI INDICATOR 7.0 (GLOVE) ×2
GLOVE BIOGEL PI INDICATOR 8 (GLOVE) ×2
GOWN STRL REUS W/ TWL LRG LVL3 (GOWN DISPOSABLE) ×2 IMPLANT
GOWN STRL REUS W/TWL LRG LVL3 (GOWN DISPOSABLE) ×9
KIT BASIN OR (CUSTOM PROCEDURE TRAY) ×3 IMPLANT
KIT ROOM TURNOVER OR (KITS) ×3 IMPLANT
NDL HYPO 25GX1X1/2 BEV (NEEDLE) ×1 IMPLANT
NEEDLE HYPO 25GX1X1/2 BEV (NEEDLE) ×3 IMPLANT
NS IRRIG 1000ML POUR BTL (IV SOLUTION) ×3 IMPLANT
PACK SHOULDER (CUSTOM PROCEDURE TRAY) ×3 IMPLANT
PACK UNIVERSAL I (CUSTOM PROCEDURE TRAY) ×3 IMPLANT
PAD ARMBOARD 7.5X6 YLW CONV (MISCELLANEOUS) ×6 IMPLANT
PLATE SUPERIOR MIDSHAFT 7H RT (Plate) ×2 IMPLANT
SCREW BONE 2.7X20MM (Screw) ×2 IMPLANT
SCREW BONE 3.5X14MM (Screw) ×4 IMPLANT
SCREW BONE 3.5X18MM (Screw) ×6 IMPLANT
SCREW COUNTERSINK (MISCELLANEOUS) IMPLANT
SCREW LOCKING 14MM (Screw) ×2 IMPLANT
SCREW LOCKING 16MM (Screw) ×2 IMPLANT
SLING ARM IMMOBILIZER MED (SOFTGOODS) ×2 IMPLANT
SLING ARM LRG ADULT FOAM STRAP (SOFTGOODS) ×3 IMPLANT
SPONGE LAP 4X18 X RAY DECT (DISPOSABLE) ×6 IMPLANT
STRIP CLOSURE SKIN 1/2X4 (GAUZE/BANDAGES/DRESSINGS) ×4 IMPLANT
SUCTION FRAZIER TIP 10 FR DISP (SUCTIONS) ×3 IMPLANT
SUT MNCRL AB 4-0 PS2 18 (SUTURE) ×3 IMPLANT
SUT MON AB 2-0 CT1 36 (SUTURE) ×3 IMPLANT
SUT VICRYL 0 CT 1 36IN (SUTURE) ×3 IMPLANT
SYR CONTROL 10ML LL (SYRINGE) ×3 IMPLANT
TOWEL OR 17X24 6PK STRL BLUE (TOWEL DISPOSABLE) ×3 IMPLANT
TOWEL OR 17X26 10 PK STRL BLUE (TOWEL DISPOSABLE) ×3 IMPLANT

## 2015-07-09 NOTE — Anesthesia Postprocedure Evaluation (Signed)
Anesthesia Post Note  Patient: Clinton [name]  Procedure(s) Performed: Procedure(s) (LRB): OPEN REDUCTION INTERNAL FIXATION (ORIF) CLAVICULAR FRACTURE (Right)  Patient location during evaluation: PACU Anesthesia Type: General Level of consciousness: awake and alert Pain management: satisfactory to patient Vital Signs Assessment: post-procedure vital signs reviewed and stable Respiratory status: spontaneous breathing and nonlabored ventilation Cardiovascular status: blood pressure returned to baseline Postop Assessment: no signs of nausea or vomiting Anesthetic complications: no    Last Vitals:  Filed Vitals:   07/09/15 1235 07/09/15 1335  BP: 141/100 129/79  Pulse:  60  Temp: 36.4 C 36.3 C  Resp: 18 18    Last Pain:  Filed Vitals:   07/09/15 1401  PainSc: Asleep    LLE Motor Response: Purposeful movement   RLE Motor Response: Purposeful movement        Tiajuana Amass

## 2015-07-09 NOTE — Progress Notes (Signed)
Patient in bed from OR waking combative and noncompliant, pulling at EKG lines, IV tubing and arm sling. Patient refuses to maintain arm sling in place. Unable to obtain vital signs at this time. Dr. Percell Miller notified of noncompliance. Dr Ola Spurr notified of status and OK'd transfer back to room 49m-13. Mother at bedside. Patient responds to Mother's request. Report given to [facility] RN [id number]. See flowsheet for vital signs.

## 2015-07-09 NOTE — Anesthesia Preprocedure Evaluation (Addendum)
Anesthesia Evaluation  Patient identified by MRN, date of birth, ID band Patient awake    Reviewed: Allergy & Precautions, NPO status , Patient's Chart, lab work & pertinent test results  Airway Mallampati: II  TM Distance: >3 FB Neck ROM: Full    Dental  (+) Dental Advisory Given   Pulmonary Current Smoker,    breath sounds clear to auscultation       Cardiovascular negative cardio ROS   Rhythm:Regular Rate:Normal     Neuro/Psych Seizures -,  Anxiety Bipolar Disorder Hx TBI and SDH    GI/Hepatic negative GI ROS, Neg liver ROS,   Endo/Other  negative endocrine ROS  Renal/GU negative Renal ROS     Musculoskeletal negative musculoskeletal ROS (+)   Abdominal   Peds  Hematology  (+) anemia ,   Anesthesia Other Findings   Reproductive/Obstetrics                            Anesthesia Physical Anesthesia Plan  ASA: III  Anesthesia Plan: General   Post-op Pain Management:    Induction: Intravenous  Airway Management Planned: Oral ETT  Additional Equipment:   Intra-op Plan:   Post-operative Plan: Extubation in OR  Informed Consent: I have reviewed the patients History and Physical, chart, labs and discussed the procedure including the risks, benefits and alternatives for the proposed anesthesia with the patient or authorized representative who has indicated his/her understanding and acceptance.   Dental advisory given  Plan Discussed with: CRNA  Anesthesia Plan Comments:         Anesthesia Quick Evaluation

## 2015-07-09 NOTE — Op Note (Signed)
07/03/2015 - 07/09/2015  11:38 AM  PATIENT:  Clinton [name]    PRE-OPERATIVE DIAGNOSIS:  right clavicle fracture  POST-OPERATIVE DIAGNOSIS:  Same  PROCEDURE:  OPEN REDUCTION INTERNAL FIXATION (ORIF) CLAVICULAR FRACTURE  SURGEON:  Emmie Frakes, Ernesta Amble, MD  PHYSICIAN ASSISTANT: Lovett Calender, PA-C, She was present and scrubbed throughout the case, critical for completion in a timely fashion, and for retraction, instrumentation, and closure.   ANESTHESIA:   General  PREOPERATIVE INDICATIONS:  [name] is a  19 y.o. male with a diagnosis of right clavicle fracture who elected for surgical management based on preoperative shortening and angulation and displacement of the fracture.    The risks benefits and alternatives were discussed with the patient preoperatively including but not limited to the risks of infection, bleeding, nerve injury, malunion, nonunion, hardware failure, the need for hardware removal, recurrent fracture, cardiopulmonary complications, the need for revision surgery, among others, and the patient was willing to proceed.    OPERATIVE IMPLANTS: stryker 7 hole clavicle plate  OPERATIVE FINDINGS: Shortened, displaced clavicle fracture  OPERATIVE PROCEDURE: The patient was brought to the operating room and placed in the supine position. General anesthesia was administered. IV antibiotics were given. He was placed in the beach chair position. The upper extremity was prepped and draped in the usual sterile fashion. Time out was performed. Incision was made over the clavicle fracture. Dissection was carried down through the platysma, and the fracture site exposed. The fracture was extremely short.  I ultimately did however achieve satisfactory mobilization, and was able to reduce the fracture anatomically.   I placed a lag screw across the fracture and had a good reduction and hold on the fracture  I then placed a 7 hole superior plate in a neutralization fashion.   I  had excellent bony apposition and restoration of anatomic alignment of the clavicle. Used C-arm to confirm appropriate alignment, reduction of the fracture, and positioning of the plate and length of the screws.  I then took final C-arm pictures, irrigated the wounds copiously, and repaired the fascia with inverted figure-of-eight Vicryl suture. The subcutaneous tissue was closed with Vicryl as well, and the skin closed with steri-strips, and the patient was awakened and returned to the PACU in stable and satisfactory condition. There were no complications.   POSTOPERATIVE PLAN: Sling full time, DVT px: ambulation  This note was generated using a template and dragon dictation system. In light of that, I have reviewed the note and all aspects of it are applicable to this case. Any dictation errors are due to the computerized dictation system.

## 2015-07-09 NOTE — Anesthesia Procedure Notes (Signed)
Procedure Name: Intubation Date/Time: 07/09/2015 10:39 AM Performed by: Melina Copa, Raequan Vanschaick R Pre-anesthesia Checklist: Patient identified, Emergency Drugs available, Suction available, Patient being monitored and Timeout performed Patient Re-evaluated:Patient Re-evaluated prior to inductionOxygen Delivery Method: Circle system utilized Preoxygenation: Pre-oxygenation with 100% oxygen Intubation Type: IV induction Ventilation: Mask ventilation without difficulty Laryngoscope Size: Miller and 2 Grade View: Grade I Tube type: Oral Tube size: 7.5 mm Number of attempts: 1 Airway Equipment and Method: Stylet Placement Confirmation: ETT inserted through vocal cords under direct vision,  positive ETCO2 and breath sounds checked- equal and bilateral Secured at: 22 cm Tube secured with: Tape Dental Injury: Teeth and Oropharynx as per pre-operative assessment

## 2015-07-09 NOTE — Progress Notes (Signed)
OT Cancellation Note  Patient Details Name: Clinton [name] MRN: [id number] DOB: 1995-12-14   Cancelled Treatment:    Reason Eval/Treat Not Completed: Patient at procedure or test/ unavailable.  Pt. Having sx. This a.m. For R clavicle fx.  Will hold for today.  Janice Coffin, COTA/L 07/09/2015, 9:28 AM

## 2015-07-09 NOTE — Transfer of Care (Signed)
Immediate Anesthesia Transfer of Care Note  Patient: Clinton [name]  Procedure(s) Performed: Procedure(s): OPEN REDUCTION INTERNAL FIXATION (ORIF) CLAVICULAR FRACTURE (Right)  Patient Location: PACU  Anesthesia Type:General  Level of Consciousness: awake and alert   Airway & Oxygen Therapy: Patient Spontanous Breathing and Patient connected to nasal cannula oxygen  Post-op Assessment: Report given to RN, Post -op Vital signs reviewed and stable and Patient moving all extremities X 4  Post vital signs: Reviewed and stable  Last Vitals:  Filed Vitals:   07/09/15 0630 07/09/15 0954  BP: 106/59 121/59  Pulse: 60 64  Temp: 36.8 C   Resp: 18     Complications: No apparent anesthesia complications

## 2015-07-09 NOTE — Interval H&P Note (Signed)
History and Physical Interval Note:  07/09/2015 7:27 AM  Clinton [name]  has presented today for surgery, with the diagnosis of clavicle fracture  The various methods of treatment have been discussed with the patient and family. After consideration of risks, benefits and other options for treatment, the patient has consented to  Procedure(s): OPEN REDUCTION INTERNAL FIXATION (ORIF) CLAVICULAR FRACTURE (Right) as a surgical intervention .  The patient's history has been reviewed, patient examined, no change in status, stable for surgery.  I have reviewed the patient's chart and labs.  Questions were answered to the patient's satisfaction.     Dunya Meiners D

## 2015-07-09 NOTE — Progress Notes (Signed)
Pt to be transported to OR. Called report to OR nurse. Wendee Copp

## 2015-07-09 NOTE — Progress Notes (Signed)
Bed Alarm was going off and I entered patients room to answer it.  Patients family was trying to make him eat some more food; pt ran into bathroom and was sitting on the commode, crying and upset.  Parents were trying to explain that he needs to eat because he would not be able to after midnight due to his surgery scheduled on 11/29.  Pt was complaining that they kept trying to feed him and the food was making him throw up and giving him the "shits."  Once I removed the bedside table containing the food, and calmed down the patient I was able to get him back into bed.  As soon as I was exiting the room, the patient started refusing the food his mother was still trying to feed him.
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
## 2015-07-09 NOTE — Progress Notes (Signed)
Orthopedic Tech Progress Note Patient Details:  Clinton [name] [birth year] [id number]  Ortho Devices Type of Ortho Device: Sling immobilizer Ortho Device/Splint Interventions: Application   [name] 07/09/2015, 2:50 PM

## 2015-07-10 ENCOUNTER — Encounter (HOSPITAL_COMMUNITY): Payer: Self-pay | Admitting: Orthopedic Surgery

## 2015-07-10 MED ORDER — OXYCODONE HCL 10 MG PO TABS: 10.0000 mg | ORAL_TABLET | ORAL | Status: DC | PRN

## 2015-07-10 MED ORDER — LEVETIRACETAM 500 MG PO TABS: 500.0000 mg | ORAL_TABLET | Freq: Two times a day (BID) | ORAL | Status: DC

## 2015-07-10 MED ORDER — DIVALPROEX SODIUM 250 MG PO DR TAB: DELAYED_RELEASE_TABLET | ORAL | Status: DC

## 2015-07-10 MED ORDER — PROPRANOLOL HCL 20 MG PO TABS: 20.0000 mg | ORAL_TABLET | Freq: Two times a day (BID) | ORAL | Status: DC

## 2015-07-10 MED ORDER — LORAZEPAM 0.5 MG PO TABS: 0.5000 mg | ORAL_TABLET | Freq: Two times a day (BID) | ORAL | Status: DC | PRN
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
## 2015-07-10 NOTE — Progress Notes (Signed)
Pt discharged home with mother and father. IV discontinued and discharge instructions given. Pt left unit via wheelchair with nurse tech at [place]
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
## 2018-09-28 ENCOUNTER — Encounter (HOSPITAL_COMMUNITY): Payer: Self-pay | Admitting: Emergency Medicine

## 2018-09-28 ENCOUNTER — Emergency Department (HOSPITAL_COMMUNITY): Payer: Medicaid Other

## 2018-09-28 ENCOUNTER — Emergency Department (HOSPITAL_COMMUNITY)
Admission: EM | Admit: 2018-09-28 | Discharge: 2018-09-28 | Disposition: A | Discharge status: Home / Self Care | Payer: Medicaid Other | Attending: Emergency Medicine | Admitting: Emergency Medicine

## 2018-09-28 ENCOUNTER — Other Ambulatory Visit: Payer: Self-pay

## 2018-09-28 DIAGNOSIS — S61421A Laceration with foreign body of right hand, initial encounter: Secondary | ICD-10-CM | POA: Diagnosis not present

## 2018-09-28 DIAGNOSIS — Y929 Unspecified place or not applicable: Secondary | ICD-10-CM | POA: Diagnosis not present

## 2018-09-28 DIAGNOSIS — Z79899 Other long term (current) drug therapy: Secondary | ICD-10-CM | POA: Insufficient documentation

## 2018-09-28 DIAGNOSIS — M25421 Effusion, right elbow: Secondary | ICD-10-CM | POA: Diagnosis not present

## 2018-09-28 DIAGNOSIS — S6391XA Sprain of unspecified part of right wrist and hand, initial encounter: Secondary | ICD-10-CM | POA: Diagnosis not present

## 2018-09-28 DIAGNOSIS — Y999 Unspecified external cause status: Secondary | ICD-10-CM | POA: Insufficient documentation

## 2018-09-28 DIAGNOSIS — S63501A Unspecified sprain of right wrist, initial encounter: Secondary | ICD-10-CM

## 2018-09-28 DIAGNOSIS — F17210 Nicotine dependence, cigarettes, uncomplicated: Secondary | ICD-10-CM | POA: Insufficient documentation

## 2018-09-28 DIAGNOSIS — S60551A Superficial foreign body of right hand, initial encounter: Secondary | ICD-10-CM

## 2018-09-28 DIAGNOSIS — F909 Attention-deficit hyperactivity disorder, unspecified type: Secondary | ICD-10-CM | POA: Insufficient documentation

## 2018-09-28 DIAGNOSIS — Y9355 Activity, bike riding: Secondary | ICD-10-CM | POA: Diagnosis not present

## 2018-09-28 DIAGNOSIS — S59901A Unspecified injury of right elbow, initial encounter: Secondary | ICD-10-CM | POA: Diagnosis present

## 2018-09-28 MED ORDER — TRAMADOL HCL 50 MG PO TABS
100.0000 mg | ORAL_TABLET | Freq: Once | ORAL | Status: AC
  Administered 2018-09-28: 100 mg via ORAL
  Filled 2018-09-28: qty 2

## 2018-09-28 MED ORDER — IBUPROFEN 600 MG PO TABS: 600.0000 mg | ORAL_TABLET | Freq: Four times a day (QID) | ORAL | 0 refills | Status: DC

## 2018-09-28 MED ORDER — LIDOCAINE-EPINEPHRINE (PF) 1 %-1:200000 IJ SOLN
10.0000 mL | Freq: Once | INTRAMUSCULAR | Status: AC
  Administered 2018-09-28: 10 mL
  Filled 2018-09-28: qty 30

## 2018-09-28 MED ORDER — BACITRACIN-NEOMYCIN-POLYMYXIN 400-5-5000 EX OINT
TOPICAL_OINTMENT | Freq: Once | CUTANEOUS | Status: AC
  Administered 2018-09-28: 1 via TOPICAL
  Filled 2018-09-28: qty 1

## 2018-09-28 MED ORDER — POVIDONE-IODINE 10 % EX SOLN
CUTANEOUS | Status: AC
  Administered 2018-09-28: 1
  Filled 2018-09-28: qty 15

## 2018-09-28 MED ORDER — ONDANSETRON HCL 4 MG PO TABS
4.0000 mg | ORAL_TABLET | Freq: Once | ORAL | Status: AC
  Administered 2018-09-28: 4 mg via ORAL
  Filled 2018-09-28: qty 1

## 2018-09-28 MED ORDER — POVIDONE-IODINE 10 % EX OINT
TOPICAL_OINTMENT | Freq: Once | CUTANEOUS | Status: DC
  Filled 2018-09-28: qty 28.35

## 2018-09-28 MED ORDER — HYDROCODONE-ACETAMINOPHEN 5-325 MG PO TABS: 1.0000 | ORAL_TABLET | ORAL | 0 refills | Status: DC | PRN

## 2018-09-28 MED ORDER — HYDROGEN PEROXIDE 3 % EX SOLN
Freq: Once | CUTANEOUS | Status: AC
  Administered 2018-09-28: 1 via TOPICAL
  Filled 2018-09-28: qty 473
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
## 2018-09-28 NOTE — Discharge Instructions (Addendum)
Your wrist x-ray and shoulder x-ray are negative for fracture or dislocation.  Your examination favors a wrist sprain.  The x-ray of your elbow shows fluid in the joint called an effusion.  This is sometimes seen if there is a hidden fracture.  Please keep your splint clean and dry.  Please see Dr. Aline Brochure in the office as soon as possible for evaluation of a hidden or occult fracture.  Use your sling for comfort.  Please use ibuprofen with breakfast, lunch, dinner, and at bedtime.  May use Norco for more severe pain.This medication may cause drowsiness. Please do not drink, drive, or participate in activity that requires concentration while taking this medication.  Please apply Neosporin to the areas of your hand to help prevent infection.  Please mark your records that you are tetanus status was updated today.
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
## 2018-10-02 ENCOUNTER — Other Ambulatory Visit: Payer: Self-pay

## 2018-10-02 ENCOUNTER — Emergency Department (HOSPITAL_COMMUNITY)
Admission: EM | Admit: 2018-10-02 | Discharge: 2018-10-02 | Disposition: A | Discharge status: Home / Self Care | Payer: Medicaid Other | Attending: Emergency Medicine | Admitting: Emergency Medicine

## 2018-10-02 ENCOUNTER — Encounter (HOSPITAL_COMMUNITY): Payer: Self-pay | Admitting: Emergency Medicine

## 2018-10-02 DIAGNOSIS — F17210 Nicotine dependence, cigarettes, uncomplicated: Secondary | ICD-10-CM | POA: Diagnosis not present

## 2018-10-02 DIAGNOSIS — Z4689 Encounter for fitting and adjustment of other specified devices: Secondary | ICD-10-CM | POA: Diagnosis not present

## 2018-10-02 DIAGNOSIS — M25521 Pain in right elbow: Secondary | ICD-10-CM | POA: Insufficient documentation

## 2018-10-02 DIAGNOSIS — Z79899 Other long term (current) drug therapy: Secondary | ICD-10-CM | POA: Diagnosis not present

## 2018-10-02 DIAGNOSIS — Z4789 Encounter for other orthopedic aftercare: Secondary | ICD-10-CM

## 2018-10-02 MED ORDER — HYDROCODONE-ACETAMINOPHEN 5-325 MG PO TABS: 1.0000 | ORAL_TABLET | ORAL | 0 refills | Status: DC | PRN
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
## 2018-10-02 NOTE — Discharge Instructions (Addendum)
Wear your splint and the sling at all times for your injury. You may take the hydrocodone prescribed for pain relief.  This will make you drowsy - do not drive within 4 hours of taking this medication.

## 2018-10-02 NOTE — ED Notes (Signed)
Splint reapplied with sleeve, Webril x 2, splint, ace x 2 with extra padding to elbow region  Pt appears unable to not fidgit with his splint

## 2018-10-02 NOTE — ED Triage Notes (Signed)
Patient complains of right elbow pain. Was seen for same Thursday night.
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
## 2019-04-20 ENCOUNTER — Emergency Department (HOSPITAL_COMMUNITY): Payer: Medicaid Other

## 2019-04-20 ENCOUNTER — Encounter (HOSPITAL_COMMUNITY): Payer: Self-pay | Admitting: Emergency Medicine

## 2019-04-20 ENCOUNTER — Other Ambulatory Visit: Payer: Self-pay

## 2019-04-20 ENCOUNTER — Emergency Department (HOSPITAL_COMMUNITY)
Admission: EM | Admit: 2019-04-20 | Discharge: 2019-04-20 | Disposition: A | Discharge status: Home / Self Care | Payer: Medicaid Other | Attending: Emergency Medicine | Admitting: Emergency Medicine

## 2019-04-20 DIAGNOSIS — Z5321 Procedure and treatment not carried out due to patient leaving prior to being seen by health care provider: Secondary | ICD-10-CM | POA: Insufficient documentation

## 2019-04-20 DIAGNOSIS — R1010 Upper abdominal pain, unspecified: Secondary | ICD-10-CM | POA: Insufficient documentation

## 2019-04-20 LAB — URINALYSIS, ROUTINE W REFLEX MICROSCOPIC
Bacteria, UA: NONE SEEN
Bilirubin Urine: NEGATIVE
Glucose, UA: NEGATIVE mg/dL
Ketones, ur: NEGATIVE mg/dL
Leukocytes,Ua: NEGATIVE
Nitrite: NEGATIVE
Protein, ur: NEGATIVE mg/dL
Specific Gravity, Urine: 1.004 — ABNORMAL LOW (ref 1.005–1.030)
pH: 6 (ref 5.0–8.0)

## 2019-04-20 LAB — COMPREHENSIVE METABOLIC PANEL
ALT: 15 U/L (ref 0–44)
AST: 22 U/L (ref 15–41)
Albumin: 4.2 g/dL (ref 3.5–5.0)
Alkaline Phosphatase: 44 U/L (ref 38–126)
Anion gap: 10 (ref 5–15)
BUN: 9 mg/dL (ref 6–20)
CO2: 26 mmol/L (ref 22–32)
Calcium: 9 mg/dL (ref 8.9–10.3)
Chloride: 98 mmol/L (ref 98–111)
Creatinine, Ser: 0.91 mg/dL (ref 0.61–1.24)
GFR calc Af Amer: >60 mL/min (ref >60)
GFR calc non Af Amer: >60 mL/min (ref >60)
Glucose, Bld: 100 mg/dL — ABNORMAL HIGH (ref 70–99)
Potassium: 3.8 mmol/L (ref 3.5–5.1)
Sodium: 134 mmol/L — ABNORMAL LOW (ref 135–145)
Total Bilirubin: 0.6 mg/dL (ref 0.3–1.2)
Total Protein: 7.4 g/dL (ref 6.5–8.1)

## 2019-04-20 LAB — CBC
HCT: 47 % (ref 39.0–52.0)
Hemoglobin: 15.6 g/dL (ref 13.0–17.0)
MCH: 30.8 pg (ref 26.0–34.0)
MCHC: 33.2 g/dL (ref 30.0–36.0)
MCV: 92.7 fL (ref 80.0–100.0)
Platelets: 166 10*3/uL (ref 150–400)
RBC: 5.07 MIL/uL (ref 4.22–5.81)
RDW: 13.2 % (ref 11.5–15.5)
WBC: 10 10*3/uL (ref 4.0–10.5)
nRBC: 0 % (ref 0.0–0.2)

## 2019-04-20 LAB — LIPASE, BLOOD: Lipase: 21 U/L (ref 11–51)

## 2019-04-20 MED ORDER — SODIUM CHLORIDE 0.9 % IV BOLUS
1000.0000 mL | Freq: Once | INTRAVENOUS | Status: AC
  Administered 2019-04-20: 1000 mL via INTRAVENOUS

## 2019-04-20 MED ORDER — SODIUM CHLORIDE 0.9% FLUSH: 3.0000 mL | Freq: Once | INTRAVENOUS | Status: DC

## 2019-04-20 NOTE — ED Triage Notes (Signed)
Patient reports back and abdominal pain x 3 days with diarrhea. Episode of vomiting last night.

## 2019-04-20 NOTE — ED Notes (Signed)
Pt was informed we need urine sample. 

## 2019-04-20 NOTE — ED Notes (Signed)
Patient left against medical advice.  Patient made aware to come back to [facility] if symptoms return or worsen.  Patient made verbal agreement.

## 2019-04-21 ENCOUNTER — Emergency Department (HOSPITAL_COMMUNITY)
Admission: EM | Admit: 2019-04-21 | Discharge: 2019-04-21 | Disposition: A | Discharge status: Home / Self Care | Payer: Medicaid Other | Attending: Emergency Medicine | Admitting: Emergency Medicine

## 2019-04-21 ENCOUNTER — Emergency Department (HOSPITAL_COMMUNITY): Payer: Medicaid Other

## 2019-04-21 ENCOUNTER — Other Ambulatory Visit: Payer: Self-pay

## 2019-04-21 ENCOUNTER — Encounter (HOSPITAL_COMMUNITY): Payer: Self-pay | Admitting: Emergency Medicine

## 2019-04-21 DIAGNOSIS — R1033 Periumbilical pain: Secondary | ICD-10-CM | POA: Insufficient documentation

## 2019-04-21 DIAGNOSIS — R197 Diarrhea, unspecified: Secondary | ICD-10-CM | POA: Diagnosis not present

## 2019-04-21 DIAGNOSIS — F17210 Nicotine dependence, cigarettes, uncomplicated: Secondary | ICD-10-CM | POA: Insufficient documentation

## 2019-04-21 DIAGNOSIS — K2900 Acute gastritis without bleeding: Secondary | ICD-10-CM | POA: Diagnosis not present

## 2019-04-21 DIAGNOSIS — F1210 Cannabis abuse, uncomplicated: Secondary | ICD-10-CM | POA: Diagnosis not present

## 2019-04-21 LAB — CBC
HCT: 46.6 % (ref 39.0–52.0)
Hemoglobin: 15.1 g/dL (ref 13.0–17.0)
MCH: 30.3 pg (ref 26.0–34.0)
MCHC: 32.4 g/dL (ref 30.0–36.0)
MCV: 93.4 fL (ref 80.0–100.0)
Platelets: 194 10*3/uL (ref 150–400)
RBC: 4.99 MIL/uL (ref 4.22–5.81)
RDW: 13.4 % (ref 11.5–15.5)
WBC: 10.3 10*3/uL (ref 4.0–10.5)
nRBC: 0 % (ref 0.0–0.2)

## 2019-04-21 LAB — COMPREHENSIVE METABOLIC PANEL
ALT: 19 U/L (ref 0–44)
AST: 25 U/L (ref 15–41)
Albumin: 4.1 g/dL (ref 3.5–5.0)
Alkaline Phosphatase: 42 U/L (ref 38–126)
Anion gap: 11 (ref 5–15)
BUN: 7 mg/dL (ref 6–20)
CO2: 25 mmol/L (ref 22–32)
Calcium: 9.1 mg/dL (ref 8.9–10.3)
Chloride: 102 mmol/L (ref 98–111)
Creatinine, Ser: 0.84 mg/dL (ref 0.61–1.24)
GFR calc Af Amer: >60 mL/min (ref >60)
GFR calc non Af Amer: >60 mL/min (ref >60)
Glucose, Bld: 109 mg/dL — ABNORMAL HIGH (ref 70–99)
Potassium: 3.8 mmol/L (ref 3.5–5.1)
Sodium: 138 mmol/L (ref 135–145)
Total Bilirubin: 0.4 mg/dL (ref 0.3–1.2)
Total Protein: 7.7 g/dL (ref 6.5–8.1)

## 2019-04-21 LAB — LIPASE, BLOOD: Lipase: 25 U/L (ref 11–51)

## 2019-04-21 LAB — LACTIC ACID, PLASMA: Lactic Acid, Venous: 0.9 mmol/L (ref 0.5–1.9)

## 2019-04-21 MED ORDER — FAMOTIDINE 20 MG PO TABS: 20.0000 mg | ORAL_TABLET | Freq: Two times a day (BID) | ORAL | 0 refills | Status: DC

## 2019-04-21 MED ORDER — LOPERAMIDE HCL 2 MG PO CAPS: 2.0000 mg | ORAL_CAPSULE | Freq: Four times a day (QID) | ORAL | 0 refills | Status: DC | PRN

## 2019-04-21 MED ORDER — SODIUM CHLORIDE 0.9 % IV BOLUS
1000.0000 mL | Freq: Once | INTRAVENOUS | Status: AC
  Administered 2019-04-21: 1000 mL via INTRAVENOUS

## 2019-04-21 MED ORDER — IOHEXOL 300 MG/ML  SOLN
100.0000 mL | Freq: Once | INTRAMUSCULAR | Status: AC | PRN
  Administered 2019-04-21: 100 mL via INTRAVENOUS

## 2019-04-21 MED ORDER — PANTOPRAZOLE SODIUM 40 MG PO TBEC: 40.0000 mg | DELAYED_RELEASE_TABLET | Freq: Once | ORAL | Status: DC

## 2019-04-21 MED ORDER — PANTOPRAZOLE SODIUM 20 MG PO TBEC: 20.0000 mg | DELAYED_RELEASE_TABLET | Freq: Every day | ORAL | 0 refills | Status: DC

## 2019-04-21 MED ORDER — PROMETHAZINE HCL 12.5 MG PO TABS: 12.5000 mg | ORAL_TABLET | Freq: Four times a day (QID) | ORAL | 1 refills | Status: DC | PRN

## 2019-04-21 MED ORDER — SODIUM CHLORIDE 0.9% FLUSH
3.0000 mL | Freq: Once | INTRAVENOUS | Status: AC
  Administered 2019-04-21: 3 mL via INTRAVENOUS

## 2019-04-21 MED ORDER — PANTOPRAZOLE SODIUM 40 MG PO TBEC: 40.0000 mg | DELAYED_RELEASE_TABLET | Freq: Every day | ORAL | 1 refills | Status: DC

## 2019-04-21 MED ORDER — PROMETHAZINE HCL 12.5 MG PO TABS: 12.5000 mg | ORAL_TABLET | Freq: Once | ORAL | Status: DC

## 2019-04-21 MED ORDER — FAMOTIDINE 20 MG PO TABS: 20.0000 mg | ORAL_TABLET | Freq: Once | ORAL | Status: DC

## 2019-04-21 NOTE — Discharge Instructions (Addendum)
Your CT scan suggest a possible viral illness versus a condition called colitis or inflammation of your colon.  Please use Pepcid 2 times daily, Imodium 4 times daily as needed for diarrhea, Protonix daily.  Use promethazine every 6 hours as needed for nausea.  Please call Dr.  Laural Golden for GI evaluation as soon as possible. please follow up with your PCP regarding your ED visit today.  Pick up medications and take as prescribed.

## 2019-04-21 NOTE — ED Notes (Signed)
Pt refused to give urine sample. Stated "I ain't giving a piss sample, I did yesterday"

## 2019-04-21 NOTE — ED Notes (Signed)
Pt left without having his IV removed.  Called the patients listed phone and his Mother states she will get him to come back up here to have it removed.
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
## 2019-04-21 NOTE — ED Triage Notes (Signed)
Patient c/o abdominal pain and diarrhea x 4 days.

## 2019-08-13 ENCOUNTER — Other Ambulatory Visit: Payer: Self-pay

## 2019-08-13 ENCOUNTER — Emergency Department (HOSPITAL_COMMUNITY): Payer: Medicaid Other

## 2019-08-13 ENCOUNTER — Encounter (HOSPITAL_COMMUNITY): Payer: Self-pay

## 2019-08-13 ENCOUNTER — Inpatient Hospital Stay (HOSPITAL_COMMUNITY)
Admission: EM | Admit: 2019-08-13 | Discharge: 2019-08-21 | DRG: 100 | Disposition: A | Discharge status: Home Health | Payer: Medicaid Other | Attending: Family Medicine | Admitting: Family Medicine

## 2019-08-13 DIAGNOSIS — Z978 Presence of other specified devices: Secondary | ICD-10-CM

## 2019-08-13 DIAGNOSIS — Z20822 Contact with and (suspected) exposure to covid-19: Secondary | ICD-10-CM | POA: Diagnosis not present

## 2019-08-13 DIAGNOSIS — G40001 Localization-related (focal) (partial) idiopathic epilepsy and epileptic syndromes with seizures of localized onset, not intractable, with status epilepticus: Secondary | ICD-10-CM | POA: Diagnosis not present

## 2019-08-13 DIAGNOSIS — N179 Acute kidney failure, unspecified: Secondary | ICD-10-CM | POA: Diagnosis not present

## 2019-08-13 DIAGNOSIS — F909 Attention-deficit hyperactivity disorder, unspecified type: Secondary | ICD-10-CM | POA: Diagnosis present

## 2019-08-13 DIAGNOSIS — F10231 Alcohol dependence with withdrawal delirium: Secondary | ICD-10-CM | POA: Diagnosis not present

## 2019-08-13 DIAGNOSIS — Z885 Allergy status to narcotic agent status: Secondary | ICD-10-CM

## 2019-08-13 DIAGNOSIS — J189 Pneumonia, unspecified organism: Secondary | ICD-10-CM | POA: Diagnosis not present

## 2019-08-13 DIAGNOSIS — I959 Hypotension, unspecified: Secondary | ICD-10-CM | POA: Diagnosis not present

## 2019-08-13 DIAGNOSIS — E869 Volume depletion, unspecified: Secondary | ICD-10-CM | POA: Diagnosis present

## 2019-08-13 DIAGNOSIS — Z79899 Other long term (current) drug therapy: Secondary | ICD-10-CM

## 2019-08-13 DIAGNOSIS — E876 Hypokalemia: Secondary | ICD-10-CM | POA: Diagnosis not present

## 2019-08-13 DIAGNOSIS — G9389 Other specified disorders of brain: Secondary | ICD-10-CM | POA: Diagnosis not present

## 2019-08-13 DIAGNOSIS — G40901 Epilepsy, unspecified, not intractable, with status epilepticus: Secondary | ICD-10-CM | POA: Diagnosis present

## 2019-08-13 DIAGNOSIS — Y95 Nosocomial condition: Secondary | ICD-10-CM | POA: Diagnosis present

## 2019-08-13 DIAGNOSIS — F419 Anxiety disorder, unspecified: Secondary | ICD-10-CM | POA: Diagnosis present

## 2019-08-13 DIAGNOSIS — B9561 Methicillin susceptible Staphylococcus aureus infection as the cause of diseases classified elsewhere: Secondary | ICD-10-CM | POA: Diagnosis present

## 2019-08-13 DIAGNOSIS — G9340 Encephalopathy, unspecified: Secondary | ICD-10-CM | POA: Diagnosis not present

## 2019-08-13 DIAGNOSIS — Z9114 Patient's other noncompliance with medication regimen: Secondary | ICD-10-CM | POA: Diagnosis not present

## 2019-08-13 DIAGNOSIS — F319 Bipolar disorder, unspecified: Secondary | ICD-10-CM | POA: Diagnosis present

## 2019-08-13 DIAGNOSIS — J9601 Acute respiratory failure with hypoxia: Secondary | ICD-10-CM | POA: Diagnosis present

## 2019-08-13 DIAGNOSIS — G40911 Epilepsy, unspecified, intractable, with status epilepticus: Secondary | ICD-10-CM | POA: Diagnosis present

## 2019-08-13 DIAGNOSIS — K852 Alcohol induced acute pancreatitis without necrosis or infection: Secondary | ICD-10-CM

## 2019-08-13 DIAGNOSIS — Z87820 Personal history of traumatic brain injury: Secondary | ICD-10-CM | POA: Diagnosis not present

## 2019-08-13 DIAGNOSIS — F17210 Nicotine dependence, cigarettes, uncomplicated: Secondary | ICD-10-CM | POA: Diagnosis not present

## 2019-08-13 DIAGNOSIS — F1010 Alcohol abuse, uncomplicated: Secondary | ICD-10-CM | POA: Diagnosis not present

## 2019-08-13 DIAGNOSIS — J690 Pneumonitis due to inhalation of food and vomit: Secondary | ICD-10-CM | POA: Diagnosis present

## 2019-08-13 DIAGNOSIS — E861 Hypovolemia: Secondary | ICD-10-CM | POA: Diagnosis present

## 2019-08-13 DIAGNOSIS — R1310 Dysphagia, unspecified: Secondary | ICD-10-CM | POA: Diagnosis not present

## 2019-08-13 DIAGNOSIS — R000 Tachycardia, unspecified: Secondary | ICD-10-CM | POA: Diagnosis not present

## 2019-08-13 DIAGNOSIS — R509 Fever, unspecified: Secondary | ICD-10-CM

## 2019-08-13 DIAGNOSIS — Z888 Allergy status to other drugs, medicaments and biological substances status: Secondary | ICD-10-CM | POA: Diagnosis not present

## 2019-08-13 DIAGNOSIS — Z781 Physical restraint status: Secondary | ICD-10-CM

## 2019-08-13 DIAGNOSIS — R001 Bradycardia, unspecified: Secondary | ICD-10-CM | POA: Diagnosis not present

## 2019-08-13 DIAGNOSIS — R410 Disorientation, unspecified: Secondary | ICD-10-CM | POA: Diagnosis not present

## 2019-08-13 DIAGNOSIS — Z9289 Personal history of other medical treatment: Secondary | ICD-10-CM

## 2019-08-13 DIAGNOSIS — R451 Restlessness and agitation: Secondary | ICD-10-CM | POA: Diagnosis not present

## 2019-08-13 LAB — COMPREHENSIVE METABOLIC PANEL
ALT: 28 U/L (ref 0–44)
AST: 44 U/L — ABNORMAL HIGH (ref 15–41)
Albumin: 4.4 g/dL (ref 3.5–5.0)
Alkaline Phosphatase: 56 U/L (ref 38–126)
Anion gap: 21 — ABNORMAL HIGH (ref 5–15)
BUN: 12 mg/dL (ref 6–20)
CO2: 15 mmol/L — ABNORMAL LOW (ref 22–32)
Calcium: 9.1 mg/dL (ref 8.9–10.3)
Chloride: 98 mmol/L (ref 98–111)
Creatinine, Ser: 1.72 mg/dL — ABNORMAL HIGH (ref 0.61–1.24)
GFR calc Af Amer: >60 mL/min (ref >60)
GFR calc non Af Amer: 55 mL/min — ABNORMAL LOW (ref >60)
Glucose, Bld: 391 mg/dL — ABNORMAL HIGH (ref 70–99)
Potassium: 3.6 mmol/L (ref 3.5–5.1)
Sodium: 134 mmol/L — ABNORMAL LOW (ref 135–145)
Total Bilirubin: 0.7 mg/dL (ref 0.3–1.2)
Total Protein: 7.8 g/dL (ref 6.5–8.1)

## 2019-08-13 LAB — CBG MONITORING, ED
Glucose-Capillary: 335 mg/dL — ABNORMAL HIGH (ref 70–99)
Glucose-Capillary: 107 mg/dL — ABNORMAL HIGH (ref 70–99)
Glucose-Capillary: 84 mg/dL (ref 70–99)
Glucose-Capillary: 111 mg/dL — ABNORMAL HIGH (ref 70–99)
Glucose-Capillary: 129 mg/dL — ABNORMAL HIGH (ref 70–99)

## 2019-08-13 LAB — BLOOD GAS, ARTERIAL
Acid-Base Excess: 0.9 mmol/L (ref 0.0–2.0)
Bicarbonate: 24.7 mmol/L (ref 20.0–28.0)
FIO2: 100
O2 Saturation: 96 %
Patient temperature: 37.6
pCO2 arterial: 46.3 mmHg (ref 32.0–48.0)
pH, Arterial: 7.363 (ref 7.350–7.450)
pO2, Arterial: 88.1 mmHg (ref 83.0–108.0)

## 2019-08-13 LAB — CBC WITH DIFFERENTIAL/PLATELET
Abs Immature Granulocytes: 0.25 10*3/uL — ABNORMAL HIGH (ref 0.00–0.07)
Basophils Absolute: 0.1 10*3/uL (ref 0.0–0.1)
Basophils Relative: 1 %
Eosinophils Absolute: 0.4 10*3/uL (ref 0.0–0.5)
Eosinophils Relative: 4 %
HCT: 48.1 % (ref 39.0–52.0)
Hemoglobin: 15 g/dL (ref 13.0–17.0)
Immature Granulocytes: 2 %
Lymphocytes Relative: 42 %
Lymphs Abs: 4.4 10*3/uL — ABNORMAL HIGH (ref 0.7–4.0)
MCH: 30.7 pg (ref 26.0–34.0)
MCHC: 31.2 g/dL (ref 30.0–36.0)
MCV: 98.6 fL (ref 80.0–100.0)
Monocytes Absolute: 0.9 10*3/uL (ref 0.1–1.0)
Monocytes Relative: 9 %
Neutro Abs: 4.5 10*3/uL (ref 1.7–7.7)
Neutrophils Relative %: 42 %
Platelets: 259 10*3/uL (ref 150–400)
RBC: 4.88 MIL/uL (ref 4.22–5.81)
RDW: 13.2 % (ref 11.5–15.5)
WBC: 10.6 10*3/uL — ABNORMAL HIGH (ref 4.0–10.5)
nRBC: 0 % (ref 0.0–0.2)

## 2019-08-13 LAB — RAPID URINE DRUG SCREEN, HOSP PERFORMED
Amphetamines: NOT DETECTED
Barbiturates: NOT DETECTED
Benzodiazepines: POSITIVE — AB
Cocaine: NOT DETECTED
Opiates: NOT DETECTED
Tetrahydrocannabinol: POSITIVE — AB

## 2019-08-13 LAB — CSF CELL COUNT WITH DIFFERENTIAL
RBC Count, CSF: 106 /mm3 — ABNORMAL HIGH
RBC Count, CSF: 21 /mm3 — ABNORMAL HIGH
Tube #: 1
Tube #: 4
WBC, CSF: 1 /mm3 (ref 0–5)
WBC, CSF: 2 /mm3 (ref 0–5)

## 2019-08-13 LAB — ETHANOL: Alcohol, Ethyl (B): <10 mg/dL (ref <10)

## 2019-08-13 LAB — I-STAT CHEM 8, ED
BUN: 12 mg/dL (ref 6–20)
Calcium, Ion: 1.12 mmol/L — ABNORMAL LOW (ref 1.15–1.40)
Chloride: 101 mmol/L (ref 98–111)
Creatinine, Ser: 1.6 mg/dL — ABNORMAL HIGH (ref 0.61–1.24)
Glucose, Bld: 380 mg/dL — ABNORMAL HIGH (ref 70–99)
HCT: 45 % (ref 39.0–52.0)
Hemoglobin: 15.3 g/dL (ref 13.0–17.0)
Potassium: 3.7 mmol/L (ref 3.5–5.1)
Sodium: 136 mmol/L (ref 135–145)
TCO2: 18 mmol/L — ABNORMAL LOW (ref 22–32)

## 2019-08-13 LAB — SALICYLATE LEVEL: Salicylate Lvl: <7 mg/dL — ABNORMAL LOW (ref 7.0–30.0)

## 2019-08-13 LAB — GLUCOSE, CSF: Glucose, CSF: 104 mg/dL — ABNORMAL HIGH (ref 40–70)

## 2019-08-13 LAB — ACETAMINOPHEN LEVEL: Acetaminophen (Tylenol), Serum: <10 ug/mL — ABNORMAL LOW (ref 10–30)

## 2019-08-13 LAB — PROTEIN, CSF: Total  Protein, CSF: 38 mg/dL (ref 15–45)

## 2019-08-13 LAB — LIPASE, BLOOD: Lipase: 274 U/L — ABNORMAL HIGH (ref 11–51)

## 2019-08-13 LAB — POC SARS CORONAVIRUS 2 AG -  ED: SARS Coronavirus 2 Ag: NEGATIVE

## 2019-08-13 LAB — RESPIRATORY PANEL BY RT PCR (FLU A&B, COVID)
Influenza A by PCR: NEGATIVE
Influenza B by PCR: NEGATIVE
SARS Coronavirus 2 by RT PCR: NEGATIVE

## 2019-08-13 MED ORDER — VANCOMYCIN HCL 500 MG/100ML IV SOLN
500.0000 mg | Freq: Once | INTRAVENOUS | Status: AC
  Administered 2019-08-13: 16:00:00 500 mg via INTRAVENOUS
  Filled 2019-08-13: qty 100

## 2019-08-13 MED ORDER — FENTANYL CITRATE (PF) 100 MCG/2ML IJ SOLN
50.0000 ug | INTRAMUSCULAR | Status: DC | PRN
  Administered 2019-08-14: 20:00:00 100 ug via INTRAVENOUS
  Administered 2019-08-14: 200 ug via INTRAVENOUS
  Administered 2019-08-15 (×3): 100 ug via INTRAVENOUS
  Administered 2019-08-15: 200 ug via INTRAVENOUS
  Administered 2019-08-15: 100 ug via INTRAVENOUS
  Administered 2019-08-16 (×4): 200 ug via INTRAVENOUS
  Administered 2019-08-16: 100 ug via INTRAVENOUS
  Administered 2019-08-16 (×2): 200 ug via INTRAVENOUS
  Filled 2019-08-13: qty 2
  Filled 2019-08-13 (×3): qty 4
  Filled 2019-08-13 (×5): qty 2
  Filled 2019-08-13: qty 4
  Filled 2019-08-13: qty 2
  Filled 2019-08-13: qty 4
  Filled 2019-08-13: qty 2
  Filled 2019-08-13 (×2): qty 4

## 2019-08-13 MED ORDER — LORAZEPAM 2 MG/ML IJ SOLN: 2.0000 mg | Freq: Once | INTRAMUSCULAR | Status: AC

## 2019-08-13 MED ORDER — LORAZEPAM 2 MG/ML IJ SOLN
1.0000 mg | INTRAMUSCULAR | Status: DC | PRN
  Administered 2019-08-14 – 2019-08-17 (×18): 2 mg via INTRAVENOUS
  Administered 2019-08-19: 14:00:00 1 mg via INTRAVENOUS
  Filled 2019-08-13 (×20): qty 1

## 2019-08-13 MED ORDER — LORAZEPAM 2 MG/ML IJ SOLN
INTRAMUSCULAR | Status: AC
  Administered 2019-08-13: 11:00:00 2 mg via INTRAVENOUS
  Filled 2019-08-13: qty 1

## 2019-08-13 MED ORDER — CHLORHEXIDINE GLUCONATE 0.12% ORAL RINSE (MEDLINE KIT)
15.0000 mL | Freq: Two times a day (BID) | OROMUCOSAL | Status: DC
  Administered 2019-08-14 – 2019-08-21 (×15): 15 mL via OROMUCOSAL

## 2019-08-13 MED ORDER — PROPOFOL 1000 MG/100ML IV EMUL
0.0000 ug/kg/min | INTRAVENOUS | Status: DC
  Administered 2019-08-13 – 2019-08-14 (×4): 50 ug/kg/min via INTRAVENOUS
  Administered 2019-08-15: 11:00:00 27 ug/kg/min via INTRAVENOUS
  Administered 2019-08-15 (×2): 50 ug/kg/min via INTRAVENOUS
  Filled 2019-08-13 (×7): qty 100

## 2019-08-13 MED ORDER — FENTANYL CITRATE (PF) 100 MCG/2ML IJ SOLN
50.0000 ug | INTRAMUSCULAR | Status: AC | PRN
  Administered 2019-08-15 (×3): 50 ug via INTRAVENOUS
  Filled 2019-08-13 (×3): qty 2

## 2019-08-13 MED ORDER — DEXTROSE 5 % IV SOLN
20.0000 mg/kg | Freq: Three times a day (TID) | INTRAVENOUS | Status: DC
  Filled 2019-08-13 (×5): qty 26.2

## 2019-08-13 MED ORDER — LEVETIRACETAM IN NACL 1000 MG/100ML IV SOLN
INTRAVENOUS | Status: AC
  Filled 2019-08-13: qty 100

## 2019-08-13 MED ORDER — PROPOFOL 1000 MG/100ML IV EMUL
5.0000 ug/kg/min | INTRAVENOUS | Status: DC
  Administered 2019-08-13 (×2): 80 ug/kg/min via INTRAVENOUS
  Filled 2019-08-13: qty 200

## 2019-08-13 MED ORDER — THIAMINE HCL 100 MG/ML IJ SOLN
100.0000 mg | Freq: Every day | INTRAMUSCULAR | Status: DC
  Administered 2019-08-14 – 2019-08-19 (×6): 100 mg via INTRAVENOUS
  Filled 2019-08-13 (×7): qty 2

## 2019-08-13 MED ORDER — PROPOFOL 1000 MG/100ML IV EMUL
INTRAVENOUS | Status: AC
  Filled 2019-08-13: qty 100

## 2019-08-13 MED ORDER — SODIUM CHLORIDE 0.9 % IV BOLUS
1000.0000 mL | Freq: Once | INTRAVENOUS | Status: AC
  Administered 2019-08-13: 12:00:00 1000 mL via INTRAVENOUS

## 2019-08-13 MED ORDER — VALPROIC ACID 250 MG/5ML PO SOLN
500.0000 mg | Freq: Three times a day (TID) | ORAL | Status: DC
  Filled 2019-08-13 (×3): qty 10

## 2019-08-13 MED ORDER — PROPOFOL 10 MG/ML IV BOLUS
INTRAVENOUS | Status: AC
  Filled 2019-08-13: qty 20

## 2019-08-13 MED ORDER — VANCOMYCIN HCL IN DEXTROSE 1-5 GM/200ML-% IV SOLN
1000.0000 mg | Freq: Once | INTRAVENOUS | Status: AC
  Administered 2019-08-13: 17:00:00 1000 mg via INTRAVENOUS
  Filled 2019-08-13: qty 200

## 2019-08-13 MED ORDER — VANCOMYCIN HCL 750 MG/150ML IV SOLN
750.0000 mg | Freq: Two times a day (BID) | INTRAVENOUS | Status: DC
  Filled 2019-08-13: qty 150

## 2019-08-13 MED ORDER — LACTATED RINGERS IV SOLN: INTRAVENOUS | Status: AC

## 2019-08-13 MED ORDER — VALPROATE SODIUM 500 MG/5ML IV SOLN
1200.0000 mg | Freq: Once | INTRAVENOUS | Status: DC
  Filled 2019-08-13: qty 12

## 2019-08-13 MED ORDER — LORAZEPAM 2 MG/ML IJ SOLN
INTRAMUSCULAR | Status: AC
  Administered 2019-08-13: 13:00:00 2 mg via INTRAVENOUS
  Filled 2019-08-13: qty 1

## 2019-08-13 MED ORDER — LEVETIRACETAM IN NACL 1500 MG/100ML IV SOLN
1500.0000 mg | Freq: Once | INTRAVENOUS | Status: AC
  Administered 2019-08-13: 12:00:00 1500 mg via INTRAVENOUS
  Filled 2019-08-13: qty 100

## 2019-08-13 MED ORDER — ROCURONIUM BROMIDE 50 MG/5ML IV SOLN
80.0000 mg | Freq: Once | INTRAVENOUS | Status: AC
  Administered 2019-08-13: 12:00:00 80 mg via INTRAVENOUS
  Filled 2019-08-13: qty 8

## 2019-08-13 MED ORDER — LORAZEPAM 2 MG/ML IJ SOLN
INTRAMUSCULAR | Status: AC
  Filled 2019-08-13: qty 1

## 2019-08-13 MED ORDER — DEXAMETHASONE SODIUM PHOSPHATE 10 MG/ML IJ SOLN
10.0000 mg | Freq: Once | INTRAMUSCULAR | Status: AC
  Administered 2019-08-13: 15:00:00 10 mg via INTRAVENOUS
  Filled 2019-08-13: qty 1

## 2019-08-13 MED ORDER — CHLORHEXIDINE GLUCONATE CLOTH 2 % EX PADS
6.0000 | MEDICATED_PAD | Freq: Every day | CUTANEOUS | Status: DC
  Administered 2019-08-15 – 2019-08-20 (×7): 6 via TOPICAL

## 2019-08-13 MED ORDER — LEVETIRACETAM IN NACL 500 MG/100ML IV SOLN
500.0000 mg | Freq: Two times a day (BID) | INTRAVENOUS | Status: DC
  Administered 2019-08-14 – 2019-08-15 (×4): 500 mg via INTRAVENOUS
  Filled 2019-08-13 (×6): qty 100

## 2019-08-13 MED ORDER — PROPOFOL 10 MG/ML IV BOLUS
60.0000 mg | Freq: Once | INTRAVENOUS | Status: AC
  Administered 2019-08-13: 12:00:00 60 mg via INTRAVENOUS

## 2019-08-13 MED ORDER — THIAMINE HCL 100 MG/ML IJ SOLN
500.0000 mg | Freq: Once | INTRAMUSCULAR | Status: AC
  Administered 2019-08-13: 14:00:00 500 mg via INTRAVENOUS
  Filled 2019-08-13: qty 6

## 2019-08-13 MED ORDER — ORAL CARE MOUTH RINSE
15.0000 mL | OROMUCOSAL | Status: DC
  Administered 2019-08-14 – 2019-08-17 (×28): 15 mL via OROMUCOSAL

## 2019-08-13 MED ORDER — DEXMEDETOMIDINE HCL IN NACL 400 MCG/100ML IV SOLN: 0.2000 ug/kg/h | INTRAVENOUS | Status: DC

## 2019-08-13 MED ORDER — FOLIC ACID 5 MG/ML IJ SOLN
1.0000 mg | Freq: Every day | INTRAMUSCULAR | Status: DC
  Administered 2019-08-14 – 2019-08-19 (×6): 1 mg via INTRAVENOUS
  Filled 2019-08-13 (×7): qty 0.2

## 2019-08-13 MED ORDER — SODIUM CHLORIDE 0.9 % IV SOLN
2.0000 g | Freq: Once | INTRAVENOUS | Status: AC
  Administered 2019-08-13: 15:00:00 2 g via INTRAVENOUS
  Filled 2019-08-13: qty 20

## 2019-08-13 MED ORDER — PROPOFOL 1000 MG/100ML IV EMUL
5.0000 ug/kg/min | INTRAVENOUS | Status: DC
  Administered 2019-08-13: 12:00:00 10 ug/kg/min via INTRAVENOUS

## 2019-08-13 MED ORDER — SODIUM CHLORIDE 0.9 % IV SOLN
20.0000 mg/kg | Freq: Once | INTRAVENOUS | Status: AC
  Administered 2019-08-13: 13:00:00 1308 mg via INTRAVENOUS
  Filled 2019-08-13: qty 26.16

## 2019-08-13 MED ORDER — ACETAMINOPHEN 650 MG RE SUPP
650.0000 mg | Freq: Once | RECTAL | Status: AC
  Administered 2019-08-13: 650 mg via RECTAL
  Filled 2019-08-13: qty 1

## 2019-08-13 MED ORDER — THIAMINE HCL 100 MG/ML IJ SOLN
Freq: Once | INTRAVENOUS | Status: AC
  Filled 2019-08-13: qty 1000

## 2019-08-13 NOTE — ED Triage Notes (Signed)
EMS reports pt has history of TBI and has seizures.  Reports has been halfing his doses of keppra and ran out last night.  EMS says pt's mother just passed away and pt has been depressed.  EMS arrived to find pt seizing,  Pt continued to seize pta.  EMS gave  5mg  versed IM pta.

## 2019-08-13 NOTE — Consult Note (Signed)
NEURO HOSPITALIST CONSULT NOTE   Requestig physician: Dr. Chase Caller  Reason for Consult: Breakthrough seizures.    History obtained from: Chart     HPI:                                                                                                                                          Clinton [name] is an 24 y.o. male with PMHx of TBI, prior right craniotomy and cranioplasty in 2015, ADHD, anxiety, bipolar disorder, cognitive/memory deficit and epilepsy who presented to OSH after having a breakthrough seizure at home. He had been halving his Keppra doses recently, then ran out of Hope Saturday night. On EMS arrival, the patient was seizing; 5 mg IM Versed was administered without resolution of the seizure activity.   On arrival to the ED, the patient was still seizing and unresponsive. At that time he was documented to have had 30+ minutes of seizure activity, consistent with status epilepticus (15 minutes prior to EMS arrival, about 20 more minutes after EMS arrival and during transport. 2 mg IV Ativan x 2 in the ED also did not break seizure activity. Due to inability to protect his airway, he was intubated in the ED. He was loaded with fosphenytoin and Keppra. Propofol was titrated upward to resolution of intermittent decorticate posturing and myoclonus which was noted in the ED and was felt to possibly represent continued seizure activity.   CT head was obtained, revealing evidence for prior surgery on the right and regions of chronic encephalomalacia bilaterally, consistent with prior TBI. No acute abnormality was seen.   Of note, the patient's mother just passed away and the patient has been depressed. Per family, he drinks heavily and had been drinking more since the death of his mother in Jul 20, 2023. The family member stated that as far as he knew, the patient had continued drinking heavily and had not stopped recently. However, EtOH level in the ED was < 10.  Toxicology screen was positive for THC (also was benzo positive, a non-informative result regarding potential use at home, due to administration of Versed by EMS).   Meningitis was suspected and the patient was started on empiric CNS dose ABX. LP was then performed which revealed normal CSF white count and protein, ruling out CNS infection.   HIs prescribed anticonvulsants at home are as follows: Keppra 500 mg po BID and valproic acid 500 mg qAM and 750 mg qHS.   With titration of propofol and fosphenytoin plus Keppra loading doses, the patient's seizure activity had resolved by 3:40 PM.   He was transferred to [facility] for admission to the Neuro ICU. On arrival he was on propofol at a rate of 80, with no seizure activity noted.   Past Medical History:  Diagnosis Date  .  ADHD (attention deficit hyperactivity disorder)   . Anxiety   . Bipolar disorder ([place])   . Cognitive deficit as late effect of traumatic brain injury ([place])   . Headache(784.0)   . Memory loss, short term   . Seizures ([place])     Past Surgical History:  Procedure Laterality Date  . CRANIOPLASTY Right 01/19/2014   Procedure: Right Cranioplasty with replacement of bone flap from abdomen;  Surgeon: Erline Levine, MD;  Location: [place] NEURO ORS;  Service: Neurosurgery;  Laterality: Right;  Right Cranioplasty with replacement of bone flap from abdomen  . CRANIOTOMY  09/22/2013   Procedure: Decompressive Craniectomy with ICP monitor placement and bone flap placement into abdomen;  Surgeon: Erline Levine, MD;  Location: [place] NEURO ORS;  Service: Neurosurgery;;  Decompressive Craniectomy with ICP monitor placement and bone flap placement into abdomen  . ESOPHAGOGASTRODUODENOSCOPY N/A 10/02/2013   Procedure: ESOPHAGOGASTRODUODENOSCOPY (EGD);  Surgeon: Gwenyth Ober, MD;  Location: [facility] ENDOSCOPY;  Service: General;  Laterality: N/A;  . ORIF CLAVICULAR FRACTURE Right 07/09/2015   Procedure: OPEN REDUCTION INTERNAL FIXATION (ORIF) CLAVICULAR  FRACTURE;  Surgeon: Renette Butters, MD;  Location: [place];  Service: Orthopedics;  Laterality: Right;  . PEG PLACEMENT N/A 10/02/2013   Procedure: PERCUTANEOUS ENDOSCOPIC GASTROSTOMY (PEG) PLACEMENT;  Surgeon: Gwenyth Ober, MD;  Location: [place];  Service: General;  Laterality: N/A;  . PERCUTANEOUS TRACHEOSTOMY N/A 10/02/2013   Procedure: [place];  Surgeon: Gwenyth Ober, MD;  Location: [place];  Service: General;  Laterality: N/A;    No family history on file.            Social History:  reports that he has been smoking cigarettes. He has a 0.45 pack-year smoking history. He has never used smokeless tobacco. He reports current alcohol use of about 9.0 standard drinks of alcohol per week. He reports current drug use. Drug: Marijuana.  Allergies  Allergen Reactions  . Seroquel [Quetiapine Fumarate] Other (See Comments)    Suicidal on this med in past - mother wants to make sure this medication is never given again  . Morphine And Related Hives and Itching    Pt had reaction to 82m morphine given through his PIV. About one minute after injection, the patient's arm began to redden with hives appearing from wrist to mid upper arm. Mostly anterior. Pt also began itching at forearm.     MEDICATIONS:                                                                                                                     Prior to Admission:  Medications Prior to Admission  Medication Sig Dispense Refill Last Dose  . levETIRAcetam (KEPPRA) 500 MG tablet Take 1 tablet (500 mg total) by mouth 2 (two) times daily. (Patient taking differently: Take 250 mg by mouth daily. ) 60 tablet 0 08/12/2019 at Unknown time  . divalproex (DEPAKOTE) 250 MG DR tablet Take 2 tabs by mouth AM and 3 tabs PM 100  tablet 0 Verify  . loperamide (IMODIUM) 2 MG capsule Take 1 capsule (2 mg total) by mouth 4 (four) times daily as needed for diarrhea or loose stools. (Patient not taking: Reported on  08/13/2019) 12 capsule 0 Not Taking at Unknown time  . propranolol (INDERAL) 20 MG tablet Take 1 tablet (20 mg total) by mouth 2 (two) times daily. 60 tablet 0 Verify   Scheduled: . chlorhexidine gluconate (MEDLINE KIT)  15 mL Mouth Rinse BID  . [START ON 08/14/2019] Chlorhexidine Gluconate Cloth  6 each Topical Daily  . [START ON 09/14/8525] folic acid  1 mg Intravenous Daily  . [START ON 08/14/2019] mouth rinse  15 mL Mouth Rinse 10 times per day  . [START ON 08/14/2019] thiamine injection  100 mg Intravenous Daily  . [START ON 08/14/2019] valproic acid  500 mg Oral TID   Continuous: . lactated ringers    . levETIRAcetam    . levETIRAcetam    . propofol (DIPRIVAN) infusion    . banana bag IV 1000 mL    . valproate sodium    . [START ON 08/14/2019] vancomycin       ROS:                                                                                                                                       Unable to obtain due to sedation.    Blood pressure (!) 93/52, pulse 88, temperature 98.3 F (36.8 C), temperature source Axillary, resp. rate 20, height '5\' 7"'  (1.702 m), weight 65.4 kg, SpO2 99 %.   General Examination:                                                                                                       Physical Exam  HEENT-  Head is normocephalic in the context of prior neurosurgical procedure on the right.  Lungs- Intubated  Extremities- No edema  Neurological Examination Mental Status: Intubated and sedated on 80 mcg/kg/min of propofol.  No purposeful responses to any external stimuli. No spontaneous movement. No eye opening.  Cranial Nerves: II: No blink to threat. Pupils sluggishly reactive 2 mm >> 1.5 mm III,IV, VI: No doll's eye reflex. Eyes conjugate at the midline with no forced gaze deviation or nystagmus.  V,VII: Face flaccidly symmetric. Subtle blink responses to corneal stimulation bilaterally.  VIII: No response to voice IX,X: Intubated XI: Unable to  assess XII: Intubated Motor/Sensory: LUE: Decreased tone. Internally rotates weakly to sternal rub and  left arm pinch - stereotyped.  RUE: Decreased tone. Elevates shoulder weakly to sternal rub and right arm pinch - stereotyped.  BLE: Decreased tone. Weak ADF to plantar stimulation - stereotyped.  Deep Tendon Reflexes:  Trace reflexes BUE.  3+ left patellar, 1+ left achilles. 2+ right patellar, 1+ right achilles.  Toes weakly upgoing bilaterally.  Cerebellar/Gait: Unable to assess.     Lab Results: Basic Metabolic Panel: Recent Labs  Lab 08/13/19 1123 08/13/19 1130  NA 134* 136  K 3.6 3.7  CL 98 101  CO2 15*  --   GLUCOSE 391* 380*  BUN 12 12  CREATININE 1.72* 1.60*  CALCIUM 9.1  --     CBC: Recent Labs  Lab 08/13/19 1123 08/13/19 1130  WBC 10.6*  --   NEUTROABS 4.5  --   HGB 15.0 15.3  HCT 48.1 45.0  MCV 98.6  --   PLT 259  --     Cardiac Enzymes: No results for input(s): CKTOTAL, CKMB, CKMBINDEX, TROPONINI in the last 168 hours.  Lipid Panel: No results for input(s): CHOL, TRIG, HDL, CHOLHDL, VLDL, LDLCALC in the last 168 hours.  Imaging: CT Head Wo Contrast  Result Date: 08/13/2019 CLINICAL DATA:  Seizure. EXAM: CT HEAD WITHOUT CONTRAST TECHNIQUE: Contiguous axial images were obtained from the base of the skull through the vertex without intravenous contrast. COMPARISON:  July 03, 2015. FINDINGS: Brain: Right frontal temporal and parietal encephalomalacia is noted most consistent with postoperative change. No mass effect or midline shift is noted. There is no evidence of hemorrhage, acute infarction or mass lesion. Ventricular size is unremarkable. Vascular: No hyperdense vessel or unexpected calcification. Skull: Status post right frontal and parietal craniotomy. No acute osseous abnormality is noted. Sinuses/Orbits: No acute finding. Other: None. IMPRESSION: Stable right-sided postoperative encephalomalacia and other changes is noted. No acute intracranial  abnormality seen. Electronically Signed   By: Marijo Conception M.D.   On: 08/13/2019 12:32   DG Chest Portable 1 View  Result Date: 08/13/2019 CLINICAL DATA:  Status post intubation. EXAM: PORTABLE CHEST 1 VIEW COMPARISON:  October 04, 2013. FINDINGS: Endotracheal and nasogastric tubes are in grossly good position. Stable cardiomediastinal silhouette. Lungs are clear. No pneumothorax or pleural effusion is noted. Bony thorax is unremarkable. Exam is limited as lung apices are not included in field-of-view. IMPRESSION: Endotracheal and nasogastric tubes are in grossly good position. No acute cardiopulmonary abnormality seen. Exam is limited as lung apices are not included in field-of-view. Electronically Signed   By: Marijo Conception M.D.   On: 08/13/2019 12:23    Assessment: 24 year old male with TBI and seizure history presenting in refractory status epilepticus to OSH ED. Clinical status resolved with titration of propofol after Keppra and fosphenytoin loading doses.  1. Current exam findings most consistent with a combination of deficits from old TBI, postictal state and sedation. No clinical seizure activity appreciated on exam.  2. CT head showed no acute findings. Chronic/stable right frontal temporal and parietal encephalomalacia was noted with overlying right frontal and parietal craniotomy. Also seen was chronic left temporal pole encephalomalacia.  3. CSF not consistent with meningitis. ABX have been discontinued.   Recommendations: 1. LTM EEG. Technician has been called for placement. Will review tracings after EEG is running.  2. Continue propofol for now. Will be able to consider titrating off when LTM is running.  3. Continue Keppra at 500 mg IV BID.  4. Received a one-time dose of fosphenytoin at OSH.  5.  Has been restarted on his with valproic acid with IV load at OSH. Continuing scheduled valproic acid at 500 mg per tube TID.   6. Clinton [name] is pt's grandfather and can be reached at  [phone].   60 minutes spent in the emergent neurological evaluation and management of this critically ill patient. Time spent included coordination of care.    Electronically signed: Dr. Kerney Elbe 08/13/2019, 10:58 PM

## 2019-08-13 NOTE — ED Notes (Signed)
Dr. Laverta Baltimore says pt still seizing, pt still unresponsive.  Preparing to intubate.

## 2019-08-13 NOTE — ED Notes (Signed)
Pt has been seizing with decorticate posturing since being transported to CT. Was given 2mg  Ativan upon return to ED room. Dr. Laverta Baltimore at bedside evaluating upon our return. Pt is now intermittently having decorticate posturing along with myoclonic jerking.

## 2019-08-13 NOTE — ED Notes (Signed)
Pt continues to have intermittent seizure activity. Pt is very irritable and begins to seize with any stimulation or touch. Slight/faint petechial rash to right bicep noted.

## 2019-08-13 NOTE — Progress Notes (Signed)
Pt arrived via [facility] to unit. Pt placed on vent on previous settings.  RT will continue to monitor.

## 2019-08-13 NOTE — ED Notes (Signed)
Pt still seizing

## 2019-08-13 NOTE — ED Notes (Signed)
Attempted to pass foley catheter. Unable to get catheter more than 3 inches into urethra. Condom cath placed.

## 2019-08-13 NOTE — ED Notes (Signed)
Pt continues to have seizure activity intermittently. Dr. Laverta Baltimore is aware.

## 2019-08-13 NOTE — ED Notes (Signed)
MD aware of neuro change. Vanita Panda)
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
## 2019-08-13 NOTE — ED Notes (Signed)
Per Dr. Laverta Baltimore, titrate Propofol by 47mcg every 10 minutes

## 2019-08-13 NOTE — ED Notes (Signed)
Barclay Look is pt's grandfather and can be reached at [phone]. He spoke with Dr. Laverta Baltimore regarding pt's condition. Reports pt drinks etoh quite heavily and the people he lives with including sister and father do not know much about him and "have issues."

## 2019-08-13 NOTE — ED Provider Notes (Signed)
Patient awaiting transfer to our affiliated center for intensive care unit bed.  I discussed this case with our neurology colleagues, and medication adjustments have been executed.   Carmin Muskrat, MD 08/13/19 1924

## 2019-08-13 NOTE — Sedation Documentation (Signed)
Dr. Laverta Baltimore intubated with size 8 ett, 24cm at lip.  Positive color change on co2 detector, bilateral breath sounds audible.

## 2019-08-13 NOTE — Progress Notes (Signed)
p Pharmacy Antibiotic Note  Clinton [name] is a 24 y.o. male admitted on 08/13/2019 with meningitis.  Pharmacy has been consulted for vancomycin dosing.  Plan:vancomycin 1.5gm iv x 1  Vancomycin 750mg  IV every 12 hours.  Goal trough 15-20 mcg/mL.  Height: 5\' 7"  (170.2 cm) Weight: 144 lb 2.9 oz (65.4 kg) IBW/kg (Calculated) : 66.1  Temp (24hrs), Avg:99.9 F (37.7 C), Min:99.7 F (37.6 C), Max:100 F (37.8 C)  Recent Labs  Lab 08/13/19 1123 08/13/19 1130  WBC 10.6*  --   CREATININE 1.72* 1.60*    Estimated Creatinine Clearance: 66.4 mL/min (A) (by C-G formula based on SCr of 1.6 mg/dL (H)).    Allergies  Allergen Reactions  . Seroquel [Quetiapine Fumarate] Other (See Comments)    Suicidal on this med in past - mother wants to make sure this medication is never given again  . Morphine And Related Hives and Itching    Pt had reaction to 2mg  morphine given through his PIV. About one minute after injection, the patient's arm began to redden with hives appearing from wrist to mid upper arm. Mostly anterior. Pt also began itching at forearm.     Antimicrobials this admission: 1/3 vancomycin >> 1/3 acyclovir > 1/3 ceftriaxone >>  Microbiology results: 1/3 BCx: sent  1/3 covid 19/Flu: negative 1/3 CSF: sent   Thank you for allowing pharmacy to be a part of this patient's care.  Donna Christen Roby [place] 08/13/2019 2:44 PM

## 2019-08-13 NOTE — H&P (Addendum)
NAME:  Clinton [name], MRN:  [id number], DOB:  Feb 11, 1996, LOS: 0 ADMISSION DATE:  08/13/2019, CONSULTATION DATE:  08/13/19 REFERRING MD:  Long, CHIEF COMPLAINT:  seizure   Brief History   24 y.o. M with PMH of TBI and seizure disorder who was taking half his prescribed Keppra dose who was brought into [facility] ED in status epilepticus despite IM Versed and Ativan.  Intubated in the ED, loaded with Keppra and fosphenytoin, CTH negative for acute findings.  Family reports recent heavy drinking.  Pt was transferred to [facility] for ICU Care  History of present illness   24 y/o M with PMH of ADHD, anxiety, bipolar disorder, TBI and seizures on Keppra who has been decreasing his Keppra dose and and ran out yesterday. Pt began seizing at home and EMS was called, documented to have over 30 minutes of seizure activity despite Versed and Ativan.  He was intubated in the ED for airway protection and loaded with fosphenytoin and Keppra. Started on Propofol after intubation and noted to have posturing and myoclonus. Last documented seizures stopped around 3:40pm  CT head with chronic encephalomalacia and no acute findings.   Family gives supplemental history that patient has been drinking heavily and more depressed after the death of his mother in 22-Jul-2023.  UDS positive for Benzo's and THC.   Pt underwent LP with CSF findings not suggestive of infection.   Labs were significant for creatinine of 1.72, WBC 10.6, lipase 274, ABG 7.3 6/46/88/24.7.  Developed low-grade fever and soft blood pressure.  Given 1 L isotonic fluids vancomycin and ceftriaxone and transferred to [facility].  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 No acute intracranial abnormality seen  Micro Data:  1/3 CSF culture>> 1/3 HSV culture>> 1/3 COVID-19 and influenza>> negative  Antimicrobials:  Vancomycin 1/3 only Ceftriaxone 1/3 only  Interim history/subjective:  Patient arrived to [facility] sedated on propofol, withdraws to pain, no further posturing or myoclonus  Objective   Blood pressure (!) 93/52, pulse 88, temperature 98.3 F (36.8 C), temperature source Axillary, resp. rate 20, height 5\' 7"  (1.702 m), weight 65.4 kg, SpO2 100 %.    Vent Mode: PRVC FiO2 (%):  [100 %] 100 % Set Rate:  [20 bmp] 20 bmp Vt Set:  [550 mL] 550 mL PEEP:  [5 cmH20] 5 cmH20 Plateau Pressure:  [15 cmH20-22 cmH20] 15 cmH20   Intake/Output Summary (Last 24 hours) at 08/13/2019 2233 Last data filed at 08/13/2019 1729 Gross per 24 hour  Intake 1433.77 ml  Output --  Net 1433.77 ml   Filed Weights   08/13/19 1204  Weight: 65.4 kg    General: Well-developed well-nourished young male in no acute distress intubated and sedated HEENT: MM pink/moist, ET tube in place Neuro: Sedated, withdraws to pain, pupils 1 mm equal and reactive CV: s1s2 regular rate and rhythm, no m/r/g PULM: CTAB GI: soft, bsx4 active, appears uncomfortable with palpation of the lower abdomen Extremities: warm/dry, no edema  Skin: no rashes or lesions   Resolved Hospital Problem list  Assessment & Plan:   Status epilepticus requiring intubation for airway protection  -likely from reducing Keppra dose possibly worsened by ETOH withdrawal in the setting of TBI -Loaded with Keppra and fosphenytoin -Neurology following appreciate recommendations -Father reports last seizure activity approximately 1 year ago P: -Continue scheduled valproic acid and Keppra,  long-term EEG -Continue propofol, neurology may wean down during EEG --Maintain full vent support with SAT/SBT as tolerated -titrate Vent setting to maintain SpO2 greater than or equal to 90%. -HOB elevated 30 degrees. -Plateau pressures less than 30 cm H20.  -Follow chest x-ray, ABG prn.   -Bronchial hygiene and RT/bronchodilator protocol.   Alcohol abuse with possible withdrawal and pancreatitis -Per patient's father he has been drinking about 80 ounces of beer per day, thinks his last drink was sometime on January 3.  He began drinking several months ago around the time of his mother's death, no prior history of withdrawals -Elevated lipase with low-grade fever and leukocytosis, patient's father also reports mild pancreatitis several months ago P: -CIWA protocol, as further seizure activity is evaluated with EEG may be able to transition to Precedex -As needed Ativan -Continue IV fluids for possible pancreatitis, cannot obtain CT with contrast due to AKI at this time -Repeat lipase  AKI -Likely secondary to volume depletion, baseline creatinine normal P: -Continue IV fluids, monitor metabolic panel and urine output -Check CK given prolonged seizure activity -Avoid nephrotoxins      Best practice:  Diet: N.p.o. Pain/Anxiety/Delirium protocol (if indicated): Propofol and fentanyl VAP protocol (if indicated): Yes DVT prophylaxis: Heparin and SCDs GI prophylaxis: Protonix Glucose control: N/A Mobility: Bedrest Code Status: Full code Family Communication: Spoke with patient's father and updated him with plan of care Disposition: ICU  Labs   CBC: Recent Labs  Lab 08/13/19 1123 08/13/19 1130  WBC 10.6*  --   NEUTROABS 4.5  --   HGB 15.0 15.3  HCT 48.1 45.0  MCV 98.6  --   PLT 259  --     Basic Metabolic Panel: Recent Labs  Lab 08/13/19 1123 08/13/19 1130  NA 134* 136  K 3.6 3.7  CL 98 101  CO2 15*  --   GLUCOSE 391* 380*  BUN 12 12  CREATININE 1.72*  1.60*  CALCIUM 9.1  --    GFR: Estimated Creatinine Clearance: 66.4 mL/min (A) (by C-G formula based on SCr of 1.6 mg/dL (H)). Recent Labs  Lab 08/13/19 1123  WBC 10.6*    Liver Function Tests: Recent Labs  Lab 08/13/19 1123  AST 44*  ALT 28  ALKPHOS 56  BILITOT 0.7  PROT 7.8  ALBUMIN 4.4   Recent Labs  Lab 08/13/19 1123  LIPASE 274*   No results for input(s): AMMONIA in the last 168 hours.  ABG    Component Value Date/Time   PHART 7.363 08/13/2019 1345   PCO2ART 46.3 08/13/2019 1345   PO2ART 88.1 08/13/2019 1345   HCO3 24.7 08/13/2019 1345   TCO2 18 (L) 08/13/2019 1130   ACIDBASEDEF 0.5 09/25/2013 0435   O2SAT 96.0 08/13/2019 1345     Coagulation Profile: No results for input(s): INR, PROTIME in the last 168 hours.  Cardiac Enzymes: No results for input(s): CKTOTAL, CKMB, CKMBINDEX, TROPONINI in the last 168 hours.  HbA1C: No results found for: HGBA1C  CBG: Recent Labs  Lab 08/13/19 1124 08/13/19 1246 08/13/19 1415 08/13/19 1730 08/13/19 2030  GLUCAP 335* 107* 84 111* 129*    Review of Systems:   Unable to obtain secondary  to intubated and sedated  Past Medical History  He,  has a past medical history of ADHD (attention deficit hyperactivity disorder), Anxiety, Bipolar disorder (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   Surgical History    Past Surgical History:  Procedure Laterality Date  . CRANIOPLASTY Right 01/19/2014   Procedure: Right Cranioplasty with replacement of bone flap from abdomen;  Surgeon: Erline Levine, MD;  Location: [place] NEURO ORS;  Service: Neurosurgery;  Laterality: Right;  Right Cranioplasty with replacement of bone flap from abdomen  . CRANIOTOMY  09/22/2013   Procedure: Decompressive Craniectomy with ICP monitor placement and bone flap placement into abdomen;  Surgeon: Erline Levine, MD;  Location: [place] NEURO ORS;  Service:  Neurosurgery;;  Decompressive Craniectomy with ICP monitor placement and bone flap placement into abdomen  . ESOPHAGOGASTRODUODENOSCOPY N/A 10/02/2013   Procedure: ESOPHAGOGASTRODUODENOSCOPY (EGD);  Surgeon: Gwenyth Ober, MD;  Location: [facility] ENDOSCOPY;  Service: General;  Laterality: N/A;  . ORIF CLAVICULAR FRACTURE Right 07/09/2015   Procedure: OPEN REDUCTION INTERNAL FIXATION (ORIF) CLAVICULAR FRACTURE;  Surgeon: Renette Butters, MD;  Location: [place];  Service: Orthopedics;  Laterality: Right;  . PEG PLACEMENT N/A 10/02/2013   Procedure: PERCUTANEOUS ENDOSCOPIC GASTROSTOMY (PEG) PLACEMENT;  Surgeon: Gwenyth Ober, MD;  Location: [place];  Service: General;  Laterality: N/A;  . PERCUTANEOUS TRACHEOSTOMY N/A 10/02/2013   Procedure: [place];  Surgeon: Gwenyth Ober, MD;  Location: [place];  Service: General;  Laterality: N/A;     Social History   reports that he has been smoking cigarettes. He has a 0.45 pack-year smoking history. He has never used smokeless tobacco. He reports current alcohol use of about 9.0 standard drinks of alcohol per week. He reports current drug use. Drug: Marijuana.   Family History   His family history is not on file.   Allergies Allergies  Allergen Reactions  . Seroquel [Quetiapine Fumarate] Other (See Comments)    Suicidal on this med in past - mother wants to make sure this medication is never given again  . Morphine And Related Hives and Itching    Pt had reaction to 2mg  morphine given through his PIV. About one minute after injection, the patient's arm began to redden with hives appearing from wrist to mid upper arm. Mostly anterior. Pt also began itching at forearm.      Home Medications  Prior to Admission medications   Medication Sig Start Date End Date Taking? Authorizing Provider  levETIRAcetam (KEPPRA) 500 MG tablet Take 1 tablet (500 mg total) by mouth 2 (two) times daily. Patient taking differently: Take 250 mg by mouth  daily.  07/10/15  Yes Riebock, Emina, NP  divalproex (DEPAKOTE) 250 MG DR tablet Take 2 tabs by mouth AM and 3 tabs PM 07/10/15   Riebock, Emina, NP  loperamide (IMODIUM) 2 MG capsule Take 1 capsule (2 mg total) by mouth 4 (four) times daily as needed for diarrhea or loose stools. Patient not taking: Reported on 08/13/2019 04/21/19   Eustaquio Maize, PA-C  propranolol (INDERAL) 20 MG tablet Take 1 tablet (20 mg total) by mouth 2 (two) times daily. 07/10/15   Erby Pian, NP     Critical care time: 62 minutes      CRITICAL CARE Performed by: Otilio Carpen Markiah Janeway   Total critical care time: 62 minutes  Critical care time was exclusive of separately billable procedures and treating other patients.  Critical  care was necessary to treat or prevent imminent or life-threatening deterioration.  Critical care was time spent personally by me on the following activities: development of treatment plan with patient and/or surrogate as well as nursing, discussions with consultants, evaluation of patient's response to treatment, examination of patient, obtaining history from patient or surrogate, ordering and performing treatments and interventions, ordering and review of laboratory studies, ordering and review of radiographic studies, pulse oximetry and re-evaluation of patient's condition.  Otilio Carpen Teaghan Melrose, PA-C [facility]  Pager# [phone], if no answer [phone]

## 2019-08-13 NOTE — ED Notes (Signed)
Pt no longer seizing and is tolerating vent well.

## 2019-08-14 ENCOUNTER — Inpatient Hospital Stay (HOSPITAL_COMMUNITY): Payer: Medicaid Other

## 2019-08-14 DIAGNOSIS — F10231 Alcohol dependence with withdrawal delirium: Secondary | ICD-10-CM

## 2019-08-14 DIAGNOSIS — K852 Alcohol induced acute pancreatitis without necrosis or infection: Secondary | ICD-10-CM

## 2019-08-14 DIAGNOSIS — J9601 Acute respiratory failure with hypoxia: Secondary | ICD-10-CM

## 2019-08-14 DIAGNOSIS — I959 Hypotension, unspecified: Secondary | ICD-10-CM

## 2019-08-14 LAB — COMPREHENSIVE METABOLIC PANEL
ALT: 25 U/L (ref 0–44)
AST: 42 U/L — ABNORMAL HIGH (ref 15–41)
Albumin: 3.5 g/dL (ref 3.5–5.0)
Alkaline Phosphatase: 34 U/L — ABNORMAL LOW (ref 38–126)
Anion gap: 13 (ref 5–15)
BUN: 17 mg/dL (ref 6–20)
CO2: 24 mmol/L (ref 22–32)
Calcium: 9 mg/dL (ref 8.9–10.3)
Chloride: 104 mmol/L (ref 98–111)
Creatinine, Ser: 1.47 mg/dL — ABNORMAL HIGH (ref 0.61–1.24)
GFR calc Af Amer: >60 mL/min (ref >60)
GFR calc non Af Amer: >60 mL/min (ref >60)
Glucose, Bld: 113 mg/dL — ABNORMAL HIGH (ref 70–99)
Potassium: 3.8 mmol/L (ref 3.5–5.1)
Sodium: 141 mmol/L (ref 135–145)
Total Bilirubin: 0.6 mg/dL (ref 0.3–1.2)
Total Protein: 6.1 g/dL — ABNORMAL LOW (ref 6.5–8.1)

## 2019-08-14 LAB — CBC
HCT: 39.8 % (ref 39.0–52.0)
Hemoglobin: 13.4 g/dL (ref 13.0–17.0)
MCH: 31 pg (ref 26.0–34.0)
MCHC: 33.7 g/dL (ref 30.0–36.0)
MCV: 92.1 fL (ref 80.0–100.0)
Platelets: 192 10*3/uL (ref 150–400)
RBC: 4.32 MIL/uL (ref 4.22–5.81)
RDW: 13.3 % (ref 11.5–15.5)
WBC: 13.5 10*3/uL — ABNORMAL HIGH (ref 4.0–10.5)
nRBC: 0 % (ref 0.0–0.2)

## 2019-08-14 LAB — BASIC METABOLIC PANEL
Anion gap: 13 (ref 5–15)
Anion gap: 13 (ref 5–15)
BUN: 16 mg/dL (ref 6–20)
BUN: 15 mg/dL (ref 6–20)
CO2: 24 mmol/L (ref 22–32)
CO2: 23 mmol/L (ref 22–32)
Calcium: 8.9 mg/dL (ref 8.9–10.3)
Calcium: 8.9 mg/dL (ref 8.9–10.3)
Chloride: 105 mmol/L (ref 98–111)
Chloride: 106 mmol/L (ref 98–111)
Creatinine, Ser: 1.3 mg/dL — ABNORMAL HIGH (ref 0.61–1.24)
Creatinine, Ser: 1.28 mg/dL — ABNORMAL HIGH (ref 0.61–1.24)
GFR calc Af Amer: >60 mL/min (ref >60)
GFR calc Af Amer: >60 mL/min (ref >60)
GFR calc non Af Amer: >60 mL/min (ref >60)
GFR calc non Af Amer: >60 mL/min (ref >60)
Glucose, Bld: 80 mg/dL (ref 70–99)
Glucose, Bld: 82 mg/dL (ref 70–99)
Potassium: 4 mmol/L (ref 3.5–5.1)
Potassium: 3.5 mmol/L (ref 3.5–5.1)
Sodium: 142 mmol/L (ref 135–145)
Sodium: 142 mmol/L (ref 135–145)

## 2019-08-14 LAB — PHOSPHORUS: Phosphorus: 2.6 mg/dL (ref 2.5–4.6)

## 2019-08-14 LAB — URINALYSIS, ROUTINE W REFLEX MICROSCOPIC
Bilirubin Urine: NEGATIVE
Glucose, UA: NEGATIVE mg/dL
Hgb urine dipstick: NEGATIVE
Ketones, ur: NEGATIVE mg/dL
Leukocytes,Ua: NEGATIVE
Nitrite: NEGATIVE
Protein, ur: NEGATIVE mg/dL
Specific Gravity, Urine: 1.013 (ref 1.005–1.030)
pH: 6 (ref 5.0–8.0)

## 2019-08-14 LAB — POCT I-STAT 7, (LYTES, BLD GAS, ICA,H+H)
Acid-Base Excess: 2 mmol/L (ref 0.0–2.0)
Bicarbonate: 25.9 mmol/L (ref 20.0–28.0)
Calcium, Ion: 1.23 mmol/L (ref 1.15–1.40)
HCT: 38 % — ABNORMAL LOW (ref 39.0–52.0)
Hemoglobin: 12.9 g/dL — ABNORMAL LOW (ref 13.0–17.0)
O2 Saturation: 97 %
Patient temperature: 98.2
Potassium: 3.5 mmol/L (ref 3.5–5.1)
Sodium: 144 mmol/L (ref 135–145)
TCO2: 27 mmol/L (ref 22–32)
pCO2 arterial: 38.4 mmHg (ref 32.0–48.0)
pH, Arterial: 7.435 (ref 7.350–7.450)
pO2, Arterial: 90 mmHg (ref 83.0–108.0)

## 2019-08-14 LAB — GLUCOSE, CAPILLARY
Glucose-Capillary: 76 mg/dL (ref 70–99)
Glucose-Capillary: 80 mg/dL (ref 70–99)

## 2019-08-14 LAB — CK: Total CK: 712 U/L — ABNORMAL HIGH (ref 49–397)

## 2019-08-14 LAB — MAGNESIUM
Magnesium: 2.1 mg/dL (ref 1.7–2.4)
Magnesium: 2.2 mg/dL (ref 1.7–2.4)

## 2019-08-14 LAB — MRSA PCR SCREENING: MRSA by PCR: NEGATIVE

## 2019-08-14 LAB — TRIGLYCERIDES: Triglycerides: 71 mg/dL (ref <150)

## 2019-08-14 LAB — LIPASE, BLOOD: Lipase: 20 U/L (ref 11–51)

## 2019-08-14 MED ORDER — CLONAZEPAM 1 MG PO TABS
2.0000 mg | ORAL_TABLET | Freq: Three times a day (TID) | ORAL | Status: DC
  Administered 2019-08-15 – 2019-08-16 (×4): 2 mg
  Filled 2019-08-14 (×4): qty 2

## 2019-08-14 MED ORDER — MIDAZOLAM 50MG/50ML (1MG/ML) PREMIX INFUSION
1.0000 mg/h | INTRAVENOUS | Status: DC
  Administered 2019-08-14: 06:00:00 1 mg/h via INTRAVENOUS
  Filled 2019-08-14: qty 50

## 2019-08-14 MED ORDER — SODIUM CHLORIDE 0.9 % IV SOLN
3.0000 g | Freq: Four times a day (QID) | INTRAVENOUS | Status: DC
  Administered 2019-08-14 – 2019-08-17 (×12): 3 g via INTRAVENOUS
  Filled 2019-08-14: qty 3
  Filled 2019-08-14: qty 8
  Filled 2019-08-14 (×3): qty 3
  Filled 2019-08-14: qty 8
  Filled 2019-08-14 (×2): qty 3
  Filled 2019-08-14: qty 8
  Filled 2019-08-14 (×5): qty 3
  Filled 2019-08-14: qty 8
  Filled 2019-08-14: qty 3

## 2019-08-14 MED ORDER — VITAL AF 1.2 CAL PO LIQD: 1000.0000 mL | ORAL | Status: DC

## 2019-08-14 MED ORDER — PANTOPRAZOLE SODIUM 40 MG IV SOLR
40.0000 mg | INTRAVENOUS | Status: DC
  Administered 2019-08-14 – 2019-08-19 (×6): 40 mg via INTRAVENOUS
  Filled 2019-08-14 (×6): qty 40

## 2019-08-14 MED ORDER — SODIUM CHLORIDE 0.9 % IV SOLN
INTRAVENOUS | Status: DC | PRN
  Administered 2019-08-14 – 2019-08-16 (×2): 250 mL via INTRAVENOUS

## 2019-08-14 MED ORDER — DOCUSATE SODIUM 50 MG/5ML PO LIQD
100.0000 mg | Freq: Two times a day (BID) | ORAL | Status: DC
  Administered 2019-08-14 – 2019-08-19 (×5): 100 mg
  Filled 2019-08-14 (×5): qty 10

## 2019-08-14 MED ORDER — CLONAZEPAM 1 MG PO TABS
2.0000 mg | ORAL_TABLET | Freq: Three times a day (TID) | ORAL | Status: DC
  Administered 2019-08-14: 2 mg via ORAL
  Filled 2019-08-14 (×2): qty 2

## 2019-08-14 MED ORDER — VALPROIC ACID 250 MG/5ML PO SOLN
500.0000 mg | Freq: Three times a day (TID) | ORAL | Status: DC
  Administered 2019-08-14 – 2019-08-16 (×7): 500 mg
  Filled 2019-08-14 (×8): qty 10

## 2019-08-14 MED ORDER — PHENYLEPHRINE HCL-NACL 10-0.9 MG/250ML-% IV SOLN: 0.0000 ug/min | INTRAVENOUS | Status: DC

## 2019-08-14 MED ORDER — LACTATED RINGERS IV BOLUS
500.0000 mL | Freq: Once | INTRAVENOUS | Status: AC
  Administered 2019-08-14: 500 mL via INTRAVENOUS

## 2019-08-14 MED ORDER — HEPARIN SODIUM (PORCINE) 5000 UNIT/ML IJ SOLN
5000.0000 [IU] | Freq: Two times a day (BID) | INTRAMUSCULAR | Status: DC
  Administered 2019-08-14 – 2019-08-21 (×14): 5000 [IU] via SUBCUTANEOUS
  Filled 2019-08-14 (×15): qty 1

## 2019-08-14 NOTE — Progress Notes (Signed)
[facility] Progress Note Patient Name: Clinton [name] DOB: [birth year] MRN: [id number]   Date of Service  08/14/2019  HPI/Events of Note  76M with hx of TBI, prior R craniotomy in 2015, ADHD and bipolar disorder, as well as epilepsy (on Keppra as outpatient) who presented with seizures refractory to benzodiazepines in the ED. These occurred in setting of taking less of his home Keppra. CT Head without change.  He is now transferred to [facility] and intubated and sedated on propofol at rate of 50 but with RN noticing some activity suspicious for seizure activity for which he administered Ativan and fentanyl with improvement.  He is being treated empirically for meningitis with antibiotics and steroids. He is being monitored on cvEEG.  eICU Interventions  Status epilepticus: - cvEEG - Empiric coverage of bacterial and HSV meningitis with ceftriaxone, vancomycin, and IV acyclovir. - Should undergo LP in AM to r/o infectious causes and allow for de-escalation of antimicrobials. - AEDs per neuro: currently Keppra and valproic acid. - Continue propofol at rate of 50 for burst suppression. - I will add Versed at 1mg /hr due to RN's report of seizure-like movements. Neurology can review these events on EEG in AM (RN pushed event button) and decide whether they represented true seizure activity or not.  AKI + Hx of EtOH abuse: - Thiamine + MV - LR mIVF at 150cc (UOP excellent currently)     Intervention Category Evaluation Type: New Patient Evaluation  Clinton [name] 08/14/2019, 5:18 AM

## 2019-08-14 NOTE — Progress Notes (Addendum)
Reason for consult: Status epilepticus  Subjective: Patient had few seizures overnight.  Currently on propofol and Versed.  ROS: Unable to obtain due to poor mental status  Examination  Vital signs in last 24 hours: Temp:  [97.6 F (36.4 C)-100.8 F (38.2 C)] 98.4 F (36.9 C) (01/04 0800) Pulse Rate:  [55-179] 69 (01/04 1000) Resp:  [13-39] 20 (01/04 1000) BP: (91-161)/(47-124) 101/62 (01/04 1000) SpO2:  [88 %-100 %] 100 % (01/04 1000) FiO2 (%):  [40 %-100 %] 40 % (01/04 0820) Weight:  [65.4 kg] 65.4 kg (01/03 2214)  General: lying in bed, not in apparent distress CVS: pulse-normal rate and rhythm RS: breathing comfortably, intubated Extremities: normal   Neuro: On propofol and Versed MS: Comatose, does not open eyes to noxious stimuli CN: pupils equal and reactive, eyes rolled upward, no gaze deviation, corneal reflex absent, gag reflex present  Motor: Withdraws to noxious stimuli in all 4 extremities, increased tone in bilateral lower extremities, has myoclonus-like movement in all 4 extremities on noxious stimuli  Basic Metabolic Panel: Recent Labs  Lab 08/13/19 1123 08/13/19 1130 08/14/19 0603  NA 134* 136 141  K 3.6 3.7 3.8  CL 98 101 104  CO2 15*  --  24  GLUCOSE 391* 380* 113*  BUN 12 12 17   CREATININE 1.72* 1.60* 1.47*  CALCIUM 9.1  --  9.0    CBC: Recent Labs  Lab 08/13/19 1123 08/13/19 1130 08/14/19 0603  WBC 10.6*  --  13.5*  NEUTROABS 4.5  --   --   HGB 15.0 15.3 13.4  HCT 48.1 45.0 39.8  MCV 98.6  --  92.1  PLT 259  --  192     Coagulation Studies: No results for input(s): LABPROT, INR in the last 72 hours.  Imaging CT head without contrast 08/13/2019: Stable right-sided postoperative encephalomalacia and other changes is noted. No acute intracranial abnormality seen.  ASSESSMENT AND PLAN: 24 year old male with TBI and seizure history presenting in refractory status epilepticus to OSH ED. Clinical status resolved with titration of propofol  after Keppra and fosphenytoin loading doses.   Status epilepticus (resolved) TBI Epilepsy with breakthrough seizure -Likely etiology for breakthrough seizures: Potential alcohol use with sudden discontinuation. Also unclear if patient was taking his antiepileptics  Recommendations: -Continue LTM EEG as patient has had intermittent seizures -Continue current dose of propofol with plan to start weaning off tomorrow morning -Continue Keppra 500 mg twice daily as well as valproic acid 500 mg 3 times daily -We will also add clonazepam 2 mg 3 times daily as patient might be in alcohol withdrawal with plan to gradually wean it off once patient is extubated -Continue thiamine 100 daily -Seizure precautions -As needed IV Versed 5 mg for clinical seizure   CRITICAL CARE Performed by: Lora Havens  Total critical care time: 35 minutes  Critical care time was exclusive of separately billable procedures and treating other patients.  Critical care was necessary to treat or prevent imminent or life-threatening deterioration.  Critical care was time spent personally by me on the following activities: development of treatment plan with patient and/or surrogate as well as nursing, discussions with consultants, evaluation of patient's response to treatment, examination of patient, obtaining history from patient or surrogate, ordering and performing treatments and interventions, ordering and review of laboratory studies, ordering and review of radiographic studies, pulse oximetry and re-evaluation of patient's condition.

## 2019-08-14 NOTE — Progress Notes (Signed)
Initial Nutrition Assessment  DOCUMENTATION CODES:   Not applicable  INTERVENTION:   Initiate Vital AF 1.2 @ 50 ml/hr via OG tube (1320 ml/day)  Provides: 1584 kcal, 99 grams protein, and 1070 ml free water.  TF regimen and propofol at current rate providing 2085 total kcal/day (100 % of kcal needs)   NUTRITION DIAGNOSIS:   Inadequate oral intake related to inability to eat as evidenced by NPO status.  GOAL:   Patient will meet greater than or equal to 90% of their needs  MONITOR:   TF tolerance, Vent status  REASON FOR ASSESSMENT:   Consult, Ventilator Enteral/tube feeding initiation and management  ASSESSMENT:   Pt with PMH of TBI, sz, anxiety, and bipolar disorder who lost his mother Nov 2020 and has been drinking heavily (80 oz) and ran out of his sz medication recently now admitted with seizures. Also positive for benzo's and THC.   Pt discussed during ICU rounds and with RN.    Patient is currently intubated on ventilator support MV: 11.4 L/min Temp (24hrs), Avg:98.7 F (37.1 C), Min:97.6 F (36.4 C), Max:100.8 F (38.2 C)  Propofol: 19 ml/hr (50 mcg) provides: 501 kcal  Medications reviewed and include: colace, thiamine, folic acid Labs reviewed    NUTRITION - FOCUSED PHYSICAL EXAM:  Deferred   Diet Order:   Diet Order    None      EDUCATION NEEDS:   No education needs have been identified at this time  Skin:  Skin Assessment: Reviewed RN Assessment  Last BM:  unknown  Height:   Ht Readings from Last 1 Encounters:  08/13/19 5\' 7"  (1.702 m)    Weight:   Wt Readings from Last 1 Encounters:  08/13/19 65.4 kg    Ideal Body Weight:  67.2 kg  BMI:  Body mass index is 22.58 kg/m.  Estimated Nutritional Needs:   Kcal:  2065  Protein:  95-115 grams  Fluid:  >2 L/day  Maylon Peppers RD, LDN, CNSC [phone] Pager [phone] After Hours Pager

## 2019-08-14 NOTE — Progress Notes (Signed)
LTM EEG initiated.  No skin break down noted.  Pt event button tested. RN educated regarding pt event button.Dr. Cheral Marker notified.

## 2019-08-14 NOTE — Progress Notes (Signed)
vLTM maintenance  No skin breakdown  Noted at sites FP1  FP2 A1 A2 F7 F8

## 2019-08-14 NOTE — Progress Notes (Signed)
Updated E-Link Physician that patient's scheduled clonazepam is ordered for oral route. Patient is currently intubated and sedated, unable to take PO, and has OG tube in place. This RN requested a change in orders to per tube route if physician feels necessary. Awaiting new orders, will continue to monitor.

## 2019-08-14 NOTE — Procedures (Addendum)
Patient Name: CRUIZE SHAULIS  MRN: [id number]  Epilepsy Attending: Lora Havens  Referring Physician/Provider: Dr. Kerney Elbe Duration: 08/14/2019 0030 to 08/15/2019 0030  Patient history: 24 year old male with past medical history of TBI, prior right craniotomy and cranioplasty and epilepsy who presented with status epilepticus.  EEG  for seizures.  Level of alertness: Comatose/sedated  AEDs during EEG study: Keppra, Depakote, propofol, Versed  Technical aspects: This EEG study was done with scalp electrodes positioned according to the 10-20 International system of electrode placement. Electrical activity was acquired at a sampling rate of 500Hz  and reviewed with a high frequency filter of 70Hz  and a low frequency filter of 1Hz . EEG data were recorded continuously and digitally stored.   Description: EEG showed an excessive amount of 15 to 18 Hz, 2-3 uV beta activity with irregular morphology distributed symmetrically and diffusely.  At times high amplitude, intermittent rhythmic generalized 2-3hz  delta activity with overriding 15 to 18 Hz beta activity was noted which would wax and wane without any clinical activity seen on video.  Patient event button was pressed on 08/14/2019 at 0420 and 0433 during which patient was noted to have intermittent semirhythmic whole-body jerking, more prominent in the right shoulder.  Concomitant EEG showed generalized high amplitude 2 to 3 Hz delta slowing with overriding 13 to 15 Hz beta activity.  Some sharply contoured waves were seen in left temporoparietal region during the same time.  Even though no clear evolution was seen, due to abrupt and and end as well as a clinical jerking, this was most likely ictal.   Event button was also placed on 08/14/2019 at 0817 for unclear reasons.  Concomitant EEG before during and after the event did not show any EEG change to suggest seizure.  Hyperventilation and photic stimulation were not performed.  ABNORMALITY -  Seizure, generalized  - Intermittent rhythmic delta slow, generalized - Excessive beta, generalized  IMPRESSION:  This study showed two seizures on 08/14/2019 at 0420 and 0433 with generalized onset during which patient was noted to have whole-body semirhythmic jerking, more prominent in right shoulder.  EEG also showed intermittent rhythmic delta activity without any clinical symptoms which is on the ictal-interictal continuum.  Additionally, there is evidence of severe diffuse encephalopathy, nonspecific etiology but could be secondary to sedation.  Daysi Boggan [name]

## 2019-08-14 NOTE — Progress Notes (Signed)
eLink Physician-Brief Progress Note Patient Name: Clinton [name] DOB: [birth year] MRN: [id number]   Date of Service  08/14/2019  HPI/Events of Note  DVT ppx and stress ulcer ppx requested.  eICU Interventions  Ordered Protonix 40mg  IV daily and heparin 5000u BID.     Intervention Category Minor Interventions: Routine modifications to care plan (e.g. PRN medications for pain, fever)  Marily Lente Nyazia Canevari 08/14/2019, 6:51 AM

## 2019-08-14 NOTE — Progress Notes (Addendum)
NAME:  Clinton [name], MRN:  [id number], DOB:  03-28-1996, LOS: 1 ADMISSION DATE:  08/13/2019, CONSULTATION DATE:  08/14/19 REFERRING MD:  Long, CHIEF COMPLAINT:  seizure   Brief History   24 y.o. M with PMH of TBI and seizure disorder admitted with status epilepticus in setting of taking half of prescribed dose Keppra.  Intubated.   History of present illness   24 y/o M with PMH of ADHD, anxiety, bipolar disorder, TBI and seizures on Keppra who has been decreasing his Keppra dose and and ran out yesterday. Pt began seizing at home and EMS was called, documented to have over 30 minutes of seizure activity despite Versed and Ativan.  He was intubated in the ED for airway protection and loaded with fosphenytoin and Keppra. Started on Propofol after intubation and noted to have posturing and myoclonus. Last documented seizures stopped around 3:40pm  CT head with chronic encephalomalacia and no acute findings.   Family gives supplemental history that patient has been drinking heavily and more depressed after the death of his mother in 08/04/2023.  UDS positive for Benzo's and THC.   Pt underwent LP with CSF findings not suggestive of infection.   Labs were significant for creatinine of 1.72, WBC 10.6, lipase 274, ABG 7.3 6/46/88/24.7.  Developed low-grade fever and soft blood pressure.  Given 1 L isotonic fluids vancomycin and ceftriaxone and transferred to [facility].  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  No acute intracranial abnormality seen  Micro Data:  1/3 CSF culture>> 1/3 HSV culture>> 1/3 COVID-19 and influenza>> negative  Antimicrobials:  Vancomycin 1/3 only Ceftriaxone 1/3 only  Interim history/subjective:  New admit overnight + 1.4 L I/O Low grade temp: 100.3 Hemodynamics stable Minimal vent settings, purulent secretions via ETT Deeply sedated this am. Escalating AEDs overnight with Versed drip added.   Objective   Blood pressure (!) 94/47, pulse 61, temperature 97.6 F (36.4 C), temperature source Axillary, resp. rate 20, height 5\' 7"  (1.702 m), weight 65.4 kg, SpO2 100 %.    Vent Mode: PRVC FiO2 (%):  [40 %-100 %] 40 % Set Rate:  [20 bmp] 20 bmp Vt Set:  [550 mL] 550 mL PEEP:  [5 cmH20] 5 cmH20 Plateau Pressure:  [15 cmH20-22 cmH20] 18 cmH20   Intake/Output Summary (Last 24 hours) at 08/14/2019 0840 Last data filed at 08/14/2019 0800 Gross per 24 hour  Intake 3068.14 ml  Output 1525 ml  Net 1543.14 ml   Filed Weights   08/13/19 1204 08/13/19 2214  Weight: 65.4 kg 65.4 kg    General: Adult male intubated and sedated on cEEG HEENT: AT/[place], MM moist. Neuro: Sedated, Pupils pinpoint, + corneal and cough. W/d in extremities CV: s1 s2 regular rate and rhythm, no m/r/g PULM: scattered rhonchi on the right, purulent secretions from ETT GI: soft, non tender, non distended Extremities: warm/dry, no edema  Skin: no rashes    Resolved Hospital Problem list     Assessment & Plan:   Status epilepticus requiring intubation for airway protection  -h/o seizure d/o 2/2 TBI.   -  SE likely due to reducing Keppra, ETOH use -Neurology following appreciate recommendations P: -EEG per Neurology -AEDs: Keppra, Depakote, Versed drip, Propofol drip -Continue propofol and versed drip until neurology has evaluated EEG  Acute hypoxic respiratory failure with suspected aspiration -Intubated 1/3 P: -Continue full vent support with lung protective strategies, deep sedation  for seizure control prevents SBT/extubation.  -Abx: see below  Alcohol abuse  -Per patient's father he has been drinking about 80 ounces of beer per day, thinks his last drink was sometime on January 3.  P: -thiamine, folic acid  Concern for pancreatitis 2/2 ETOH use -Elevated lipase with low-grade fever and leukocytosis, patient's father also reports mild pancreatitis several months ago P: -Repeat lipase 20 -Low suspicion for pancreatitis at this time  AKI -Likely secondary to volume depletion, baseline creatinine normal P: -IVF resuscitation with improving creatinine.  -Continue IVF resuscitation, reduce IVF rate 75 ml/hr with plans to stop this evening to prevent over resuscitation  Low grade temp: Leukocytosis -purulent secretions from ETT, concern for aspiration. Respiratory culture ordered -LP unremarkable and low suspicion for meningitis -Unasyn for aspiration PNA. Pending CXR   Best practice:  Diet: Nutrition consult for tube feeds Pain/Anxiety/Delirium protocol (if indicated): Propofol, Versed drip. PRN fentanyl VAP protocol (if indicated): Yes DVT prophylaxis: Heparin and SCDs GI prophylaxis: Protonix Glucose control: N/A Mobility: Bedrest Code Status: Full code Family Communication: Will call and update father today Disposition: ICU  Labs   CBC: Recent Labs  Lab 08/13/19 1123 08/13/19 1130 08/14/19 0603  WBC 10.6*  --  13.5*  NEUTROABS 4.5  --   --   HGB 15.0 15.3 13.4  HCT 48.1 45.0 39.8  MCV 98.6  --  92.1  PLT 259  --  [id number]    Basic Metabolic Panel: Recent Labs  Lab 08/13/19 1123 08/13/19 1130 08/14/19 0603  NA 134* 136 141  K 3.6 3.7 3.8  CL 98 101 104  CO2 15*  --  24  GLUCOSE 391* 380* 113*  BUN 12 12 17   CREATININE 1.72* 1.60* 1.47*  CALCIUM 9.1  --  9.0   GFR: Estimated Creatinine Clearance: 72.3 mL/min (A) (by C-G formula based on SCr of 1.47 mg/dL (H)). Recent Labs  Lab 08/13/19 1123 08/14/19 0603  WBC 10.6* 13.5*     Liver Function Tests: Recent Labs  Lab 08/13/19 1123 08/14/19 0603  AST 44* 42*  ALT 28 25  ALKPHOS 56 34*  BILITOT 0.7 0.6  PROT 7.8 6.1*  ALBUMIN 4.4 3.5   Recent Labs  Lab 08/13/19 1123 08/14/19 0603  LIPASE 274* 20   No results for input(s): AMMONIA in the last 168 hours.  ABG    Component Value Date/Time   PHART 7.363 08/13/2019 1345   PCO2ART 46.3 08/13/2019 1345   PO2ART 88.1 08/13/2019 1345   HCO3 24.7 08/13/2019 1345   TCO2 18 (L) 08/13/2019 1130   ACIDBASEDEF 0.5 09/25/2013 0435   O2SAT 96.0 08/13/2019 1345     Coagulation Profile: No results for input(s): INR, PROTIME in the last 168 hours.  Cardiac Enzymes: Recent Labs  Lab 08/14/19 0603  CKTOTAL 712*    HbA1C: No results found for: HGBA1C  CBG: Recent Labs  Lab 08/13/19 1124 08/13/19 1246 08/13/19 1415 08/13/19 1730 08/13/19 2030  GLUCAP 335* 107* 84 111* 129*    Critical care time: 35  minutes     Paulita Fujita, ACNP [facility]  After hours pager: [phone]  Attending Note:  23 year  old male with TBI and etoh abuse who reduced his keppra dose on his own and presented in status.  Started on a versed and propofol drip overnight due to ongoing seizure.  Intubated for airway protection and PCCM was consulted.  On exam, completely unresponsive with clear lungs.  I reviewed CXR myself, ETT is in a good position.  Discussed with PCCM-NP.  Will continue full vent support.  Adjust vent for ABG.  Neuro assisting with sedatives.  Hold off weaning while on multiple sedatives.  PCCM will continue to monitor.  Replace electrolytes.  AM labs.  Start TF.  The patient is critically ill with multiple organ systems failure and requires high complexity decision making for assessment and support, frequent evaluation and titration of therapies, application of advanced monitoring technologies and extensive interpretation of multiple databases.   Critical Care Time devoted  to patient care services described in this note is  32  Minutes. This time reflects time of care of this signee Dr Jennet Maduro. This critical care time does not reflect procedure time, or teaching time or supervisory time of PA/NP/Med student/Med Resident etc but could involve care discussion time.  Rush Farmer, M.D. [facility] Pulmonary/Critical Care Medicine.

## 2019-08-15 ENCOUNTER — Inpatient Hospital Stay (HOSPITAL_COMMUNITY): Payer: Medicaid Other

## 2019-08-15 DIAGNOSIS — G9340 Encephalopathy, unspecified: Secondary | ICD-10-CM

## 2019-08-15 LAB — GLUCOSE, CAPILLARY
Glucose-Capillary: 76 mg/dL (ref 70–99)
Glucose-Capillary: 79 mg/dL (ref 70–99)
Glucose-Capillary: 80 mg/dL (ref 70–99)
Glucose-Capillary: 82 mg/dL (ref 70–99)
Glucose-Capillary: 89 mg/dL (ref 70–99)
Glucose-Capillary: 93 mg/dL (ref 70–99)

## 2019-08-15 LAB — BASIC METABOLIC PANEL
Anion gap: 11 (ref 5–15)
BUN: 15 mg/dL (ref 6–20)
CO2: 24 mmol/L (ref 22–32)
Calcium: 8.7 mg/dL — ABNORMAL LOW (ref 8.9–10.3)
Chloride: 109 mmol/L (ref 98–111)
Creatinine, Ser: 1.26 mg/dL — ABNORMAL HIGH (ref 0.61–1.24)
GFR calc Af Amer: >60 mL/min (ref >60)
GFR calc non Af Amer: >60 mL/min (ref >60)
Glucose, Bld: 73 mg/dL (ref 70–99)
Potassium: 3.5 mmol/L (ref 3.5–5.1)
Sodium: 144 mmol/L (ref 135–145)

## 2019-08-15 LAB — CBC
HCT: 40.1 % (ref 39.0–52.0)
Hemoglobin: 13.1 g/dL (ref 13.0–17.0)
MCH: 30.8 pg (ref 26.0–34.0)
MCHC: 32.7 g/dL (ref 30.0–36.0)
MCV: 94.4 fL (ref 80.0–100.0)
Platelets: 173 10*3/uL (ref 150–400)
RBC: 4.25 MIL/uL (ref 4.22–5.81)
RDW: 14.2 % (ref 11.5–15.5)
WBC: 11.5 10*3/uL — ABNORMAL HIGH (ref 4.0–10.5)
nRBC: 0 % (ref 0.0–0.2)

## 2019-08-15 LAB — TRIGLYCERIDES: Triglycerides: 152 mg/dL — ABNORMAL HIGH (ref <150)

## 2019-08-15 LAB — PHOSPHORUS
Phosphorus: 3 mg/dL (ref 2.5–4.6)
Phosphorus: 3.4 mg/dL (ref 2.5–4.6)

## 2019-08-15 LAB — MAGNESIUM
Magnesium: 2 mg/dL (ref 1.7–2.4)
Magnesium: 1.8 mg/dL (ref 1.7–2.4)

## 2019-08-15 LAB — LEVETIRACETAM LEVEL: Levetiracetam Lvl: 35.2 ug/mL (ref 10.0–40.0)

## 2019-08-15 LAB — VDRL, CSF: VDRL Quant, CSF: NONREACTIVE

## 2019-08-15 MED ORDER — MIDAZOLAM HCL (PF) 5 MG/ML IJ SOLN
5.0000 mg | INTRAMUSCULAR | Status: DC | PRN
  Administered 2019-08-15 (×4): 5 mg via INTRAVENOUS

## 2019-08-15 MED ORDER — POTASSIUM CHLORIDE 20 MEQ/15ML (10%) PO SOLN
40.0000 meq | Freq: Three times a day (TID) | ORAL | Status: AC
  Administered 2019-08-15 (×2): 40 meq
  Filled 2019-08-15 (×2): qty 30

## 2019-08-15 MED ORDER — LEVETIRACETAM IN NACL 500 MG/100ML IV SOLN
500.0000 mg | Freq: Once | INTRAVENOUS | Status: AC
  Administered 2019-08-15: 12:00:00 500 mg via INTRAVENOUS
  Filled 2019-08-15: qty 100

## 2019-08-15 MED ORDER — LEVETIRACETAM IN NACL 1000 MG/100ML IV SOLN
1000.0000 mg | Freq: Two times a day (BID) | INTRAVENOUS | Status: DC
  Administered 2019-08-15 – 2019-08-21 (×12): 1000 mg via INTRAVENOUS
  Filled 2019-08-15 (×13): qty 100

## 2019-08-15 MED ORDER — MIDAZOLAM BOLUS VIA INFUSION: 5.0000 mg | INTRAVENOUS | Status: DC | PRN

## 2019-08-15 MED ORDER — DEXMEDETOMIDINE HCL IN NACL 400 MCG/100ML IV SOLN
0.4000 ug/kg/h | INTRAVENOUS | Status: DC
  Administered 2019-08-15: 1.2 ug/kg/h via INTRAVENOUS
  Administered 2019-08-15: 0.8 ug/kg/h via INTRAVENOUS
  Administered 2019-08-15: 11:00:00 0.5 ug/kg/h via INTRAVENOUS
  Administered 2019-08-16 (×3): 1.2 ug/kg/h via INTRAVENOUS
  Administered 2019-08-16: 20:00:00 0.6 ug/kg/h via INTRAVENOUS
  Administered 2019-08-16: 04:00:00 0.8 ug/kg/h via INTRAVENOUS
  Administered 2019-08-17: 06:00:00 1.2 ug/kg/h via INTRAVENOUS
  Administered 2019-08-17: 11:00:00 1 ug/kg/h via INTRAVENOUS
  Administered 2019-08-17: 23:00:00 1.4 ug/kg/h via INTRAVENOUS
  Administered 2019-08-17: 18:00:00 0.9 ug/kg/h via INTRAVENOUS
  Administered 2019-08-18 (×2): 1.4 ug/kg/h via INTRAVENOUS
  Administered 2019-08-18: 12:00:00 0.9 ug/kg/h via INTRAVENOUS
  Filled 2019-08-15 (×2): qty 100
  Filled 2019-08-15: qty 200
  Filled 2019-08-15 (×9): qty 100
  Filled 2019-08-15: qty 200
  Filled 2019-08-15: qty 100

## 2019-08-15 NOTE — Progress Notes (Signed)
[place] Progress Note Patient Name: Clinton [name] DOB: [birth year] MRN: [id number]   Date of Service  08/15/2019  HPI/Events of Note  Agitation - Patient reaching for ETT. Request for bilateral soft wrist restraints.   eICU Interventions  Will order: 1. Bilateral soft wrist restraints X 8 hours.      Intervention Category Major Interventions: Delirium, psychosis, severe agitation - evaluation and management  Sidharth Leverette Eugene 08/15/2019, 11:51 PM

## 2019-08-15 NOTE — Progress Notes (Addendum)
ABG unable to upload due to connection issues with istat and Epic.  ABG results from 08/15/2019 at 0425:  Ph: 7.51 PCO2: 29.9 PO2: 93 Bicarb: 24 98%.

## 2019-08-15 NOTE — Progress Notes (Addendum)
Reason for consult: Status epilepticus  Subjective: No acute events overnight.  Currently on propofol.  ROS: Unable to obtain due to poor mental status  Examination  Vital signs in last 24 hours: Temp:  [97.7 F (36.5 C)-98.8 F (37.1 C)] 98.8 F (37.1 C) (01/05 0800) Pulse Rate:  [53-127] 105 (01/05 1000) Resp:  [0-46] 0 (01/05 1000) BP: (82-132)/(52-78) 124/72 (01/05 1000) SpO2:  [96 %-100 %] 100 % (01/05 1000) FiO2 (%):  [30 %-40 %] 30 % (01/05 1000) Weight:  [76.3 kg] 76.3 kg (01/05 0500)  General: lying in bed, not in apparent distress CVS: pulse-normal rate and rhythm RS: breathing comfortably, intubated Extremities: normal   Neuro: On propofol MS: Opens eyes to verbal stimuli, follows simple commands like raising his arms and wiggling his toes CN: Difficult to assess pupil reactivity as patient possibly close to his eyes, was able to track examiner, difficult to assess rest of the cranial nerves due to intubation  Motor: Antigravity strength in all 4 extremities Reflexes: 1+ bilaterally over patella, biceps, plantars: Upgoing bilaterally   Basic Metabolic Panel: Recent Labs  Lab 08/13/19 1123 08/13/19 1123 08/13/19 1130 08/14/19 0603 08/14/19 1706 08/14/19 1816 08/14/19 1819 08/15/19 0309  NA 134*   < > 136 141 142 144 142 144  K 3.6   < > 3.7 3.8 4.0 3.5 3.5 3.5  CL 98  --  101 104 105  --  106 109  CO2 15*  --   --  24 24  --  23 24  GLUCOSE 391*  --  380* 113* 80  --  82 73  BUN 12  --  12 17 16   --  15 15  CREATININE 1.72*  --  1.60* 1.47* 1.30*  --  1.28* 1.26*  CALCIUM 9.1  --   --  9.0 8.9  --  8.9 8.7*  MG  --   --   --   --  2.1  --  2.2 2.0  PHOS  --   --   --   --  2.6  --   --  3.0   < > = values in this interval not displayed.    CBC: Recent Labs  Lab 08/13/19 1123 08/13/19 1130 08/14/19 0603 08/14/19 1816 08/15/19 0309  WBC 10.6*  --  13.5*  --  11.5*  NEUTROABS 4.5  --   --   --   --   HGB 15.0 15.3 13.4 12.9* 13.1  HCT 48.1  45.0 39.8 38.0* 40.1  MCV 98.6  --  92.1  --  94.4  PLT 259  --  192  --  173     Coagulation Studies: No results for input(s): LABPROT, INR in the last 72 hours.  Imaging CT head without contrast 08/13/2019: Stable right-sided postoperative encephalomalacia and other changes is noted. No acute intracranial abnormality seen.  ASSESSMENT AND PLAN: 24 year old male with TBI and seizure history presenting in refractory status epilepticus to OSH ED. Clinical status resolved with titration of propofol after Keppra and fosphenytoin loading doses.   Status epilepticus (resolved) TBI Epilepsy with breakthrough seizure -Likely etiology for breakthrough seizures: Potential alcohol use with sudden discontinuation. Also unclear if patient was taking his antiepileptics  Recommendations: - Will increase keppra to 1000mg  BID - We will start weaning off propofol today at 5 mcg every hour - Continue LTM EEG while we wean off propofol - Continue valproic acid 500 mg 3 times daily, clonazepam 2 mg 3  times daily  - Plan to gradually wean off clonazepam once patient is extubated - Continue thiamine 100 daily - Seizure precautions - As needed IV Versed 5 mg for clinical seizure   CRITICAL CARE Performed by: Lora Havens  Total critical care time: 35 minutes  Critical care time was exclusive of separately billable procedures and treating other patients.  Critical care was necessary to treat or prevent imminent or life-threatening deterioration.  Critical care was time spent personally by me on the following activities: development of treatment plan with patient and/or surrogate as well as nursing, discussions with consultants, evaluation of patient's response to treatment, examination of patient, obtaining history from patient or surrogate, ordering and performing treatments and interventions, ordering and review of laboratory studies, ordering and review of radiographic studies, pulse  oximetry and re-evaluation of patient's condition.

## 2019-08-15 NOTE — Progress Notes (Signed)
RT note: patient on continuous EEG at this time and is currently not breathing over ventilator set rate.  Will hold on SBT this AM.  Tolerating current ventilator settings well.  Will continue to monitor.

## 2019-08-15 NOTE — Progress Notes (Signed)
NAME:  Clinton [name], MRN:  [id number], DOB:  03-30-1996, LOS: 2 ADMISSION DATE:  08/13/2019, CONSULTATION DATE:  08/15/19 REFERRING MD:  Long, CHIEF COMPLAINT:  seizure   Brief History   24 y.o. M with PMH of TBI and seizure disorder admitted with status epilepticus in setting of taking half of prescribed dose Keppra.  Intubated.   History of present illness   24 y/o M with PMH of ADHD, anxiety, bipolar disorder, TBI and seizures on Keppra who has been decreasing his Keppra dose and and ran out yesterday. Pt began seizing at home and EMS was called, documented to have over 30 minutes of seizure activity despite Versed and Ativan.  He was intubated in the ED for airway protection and loaded with fosphenytoin and Keppra. Started on Propofol after intubation and noted to have posturing and myoclonus. Last documented seizures stopped around 3:40pm  CT head with chronic encephalomalacia and no acute findings.   Family gives supplemental history that patient has been drinking heavily and more depressed after the death of his mother in 07-23-23.  UDS positive for Benzo's and THC.   Pt underwent LP with CSF findings not suggestive of infection.   Labs were significant for creatinine of 1.72, WBC 10.6, lipase 274, ABG 7.3 6/46/88/24.7.  Developed low-grade fever and soft blood pressure.  Given 1 L isotonic fluids vancomycin and ceftriaxone and transferred to [facility].  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  No acute intracranial abnormality seen  Micro Data:  1/3 CSF culture>> 1/3 HSV culture>> 1/3 COVID-19 and influenza>> negative  Antimicrobials:  Vancomycin 1/3 only Ceftriaxone 1/3 only  Interim history/subjective:  No events overnight, remains on high dose sedation  Objective   Blood pressure 119/63, pulse 72, temperature 98.1 F (36.7 C), temperature source Axillary, resp. rate 20, height 5\' 7"  (1.702 m), weight 76.3 kg, SpO2 99 %.    Vent Mode: PRVC FiO2 (%):  [30 %-40 %] 30 % Set Rate:  [20 bmp] 20 bmp Vt Set:  [550 mL] 550 mL PEEP:  [5 cmH20] 5 cmH20 Plateau Pressure:  [16 cmH20-18 cmH20] 17 cmH20   Intake/Output Summary (Last 24 hours) at 08/15/2019 0746 Last data filed at 08/15/2019 0700 Gross per 24 hour  Intake 2408.11 ml  Output 2070 ml  Net 338.11 ml   Filed Weights   08/13/19 1204 08/13/19 2214 08/15/19 0500  Weight: 65.4 kg 65.4 kg 76.3 kg   General: Acutely ill appearing male, NAD, sedate HEENT: [place]/AT, PERRL, EOM-I and MMM, ETT in place Neuro: Sedate, not moving any ext to pain CV: RRR, Nl S1/S2 and -M/R/G PULM: Coarse diffusely GI: soft, non tender, non distended Extremities: warm/dry, no edema  Skin: no rashes   I reviewed CXR myself, ETT is in a good position  Discussed with bedside RN  Resolved Hospital Problem list     Assessment & Plan:   Status epilepticus requiring intubation for airway protection  -h/o seizure d/o 2/2 TBI.   -SE likely due to reducing Keppra, ETOH use -Neurology following appreciate recommendations P: -EEG per Neurology -  AEDs: Keppra, Depakote, Versed drip, Propofol drip -Continue propofol and d/c versed  Acute hypoxic respiratory failure with suspected aspiration -Intubated 1/3 P: -Continue full vent support with lung protective strategies, deep sedation for seizure control prevents SBT/extubation.  -Abx: see below  Alcohol abuse  -Per patient's father he has been drinking about 80 ounces of beer per day,  thinks his last drink was sometime on January 3.  P: -thiamine, folic acid  Concern for pancreatitis 2/2 ETOH use -Elevated lipase with low-grade fever and leukocytosis, patient's father also reports mild pancreatitis several months ago P: -Repeat lipase 20 -Low suspicion for pancreatitis at this time  AKI -Likely secondary to volume depletion, baseline creatinine normal P: -IVF resuscitation with improving creatinine.  -Continue IVF resuscitation, reduce IVF rate 75 ml/hr with plans to stop this evening to prevent over resuscitation  Low grade temp: Leukocytosis -purulent secretions from ETT, concern for aspiration. Respiratory culture ordered -LP unremarkable and low suspicion for meningitis -Unasyn for aspiration PNA. Pending CXR   Best practice:  Diet: Nutrition consult for tube feeds Pain/Anxiety/Delirium protocol (if indicated): Propofol, Versed drip. PRN fentanyl VAP protocol (if indicated): Yes DVT prophylaxis: Heparin and SCDs GI prophylaxis: Protonix Glucose control: N/A Mobility: Bedrest Code Status: Full code Family Communication: Will call and update father today Disposition: ICU  Labs   CBC: Recent Labs  Lab 08/13/19 1123 08/13/19 1130 08/14/19 0603 08/14/19 1816 08/15/19 0309  WBC 10.6*  --  13.5*  --  11.5*  NEUTROABS 4.5  --   --   --   --   HGB 15.0 15.3 13.4 12.9* 13.1  HCT 48.1 45.0 39.8 38.0* 40.1  MCV 98.6  --  92.1  --  94.4  PLT 259  --  192  --  [id number]    Basic Metabolic Panel: Recent Labs  Lab 08/13/19 1123 08/13/19 1130 08/14/19 0603 08/14/19 1706 08/14/19 1816 08/14/19 1819 08/15/19 0309  NA 134* 136 141 142 144 142 144  K 3.6 3.7 3.8 4.0 3.5 3.5 3.5  CL 98 101 104 105  --  106 109  CO2 15*  --  24 24  --  23 24  GLUCOSE 391* 380* 113* 80  --  82 73  BUN 12 12 17 16   --  15 15  CREATININE 1.72* 1.60* 1.47* 1.30*  --  1.28* 1.26*  CALCIUM 9.1  --  9.0 8.9  --  8.9 8.7*  MG  --   --   --  2.1  --  2.2 2.0  PHOS  --   --    --  2.6  --   --  3.0   GFR: Estimated Creatinine Clearance: 85.2 mL/min (A) (by C-G formula based on SCr of 1.26 mg/dL (H)). Recent Labs  Lab 08/13/19 1123 08/14/19 0603 08/15/19 0309  WBC 10.6* 13.5* 11.5*    Liver Function Tests: Recent Labs  Lab 08/13/19 1123 08/14/19 0603  AST 44* 42*  ALT 28 25  ALKPHOS 56 34*  BILITOT 0.7 0.6  PROT 7.8 6.1*  ALBUMIN 4.4 3.5   Recent Labs  Lab 08/13/19 1123 08/14/19 0603  LIPASE 274* 20   No results for input(s): AMMONIA in the last 168 hours.  ABG    Component Value Date/Time   PHART 7.435 08/14/2019 1816   PCO2ART 38.4 08/14/2019 1816   PO2ART 90.0 08/14/2019 1816   HCO3 25.9 08/14/2019 1816   TCO2 27 08/14/2019 1816   ACIDBASEDEF 0.5 09/25/2013 0435   O2SAT 97.0  08/14/2019 1816     Coagulation Profile: No results for input(s): INR, PROTIME in the last 168 hours.  Cardiac Enzymes: Recent Labs  Lab 08/14/19 0603  CKTOTAL 712*    HbA1C: No results found for: HGBA1C  CBG: Recent Labs  Lab 08/13/19 1730 08/13/19 2030 08/14/19 2150 08/14/19 2352 08/15/19 0347  GLUCAP 111* 129* 76 80 76   The patient is critically ill with multiple organ systems failure and requires high complexity decision making for assessment and support, frequent evaluation and titration of therapies, application of advanced monitoring technologies and extensive interpretation of multiple databases.   Critical Care Time devoted to patient care services described in this note is  31  Minutes. This time reflects time of care of this signee Dr Jennet Maduro. This critical care time does not reflect procedure time, or teaching time or supervisory time of PA/NP/Med student/Med Resident etc but could involve care discussion time.  Rush Farmer, M.D. [facility] Pulmonary/Critical Care Medicine.

## 2019-08-15 NOTE — Progress Notes (Signed)
Per Dr. Hortense Ramal, titrate Propofol by 39mcg every hour to see how patient tolerates.

## 2019-08-15 NOTE — Plan of Care (Signed)
LTM eeg reviewed till 23. No definite seizures seen. Please review final report for details.   Clinton [name] [name]

## 2019-08-15 NOTE — Progress Notes (Signed)
LTM EEG continues. Impedances checked, reset as needed to <5 Ohms. Checked for skin break down at FP1, FP2, F7, none found. Will continue to monitor.

## 2019-08-15 NOTE — Procedures (Addendum)
Patient Name: Clinton [name]  MRN: [id number]  Epilepsy Attending: Lora Havens  Referring Physician/Provider: Dr. Kerney Elbe Duration: Aug 29, 2019 0030 to 29-Aug-2019 2349  Patient history: 24 year old male with past medical history of TBI, prior right craniotomy and cranioplasty and epilepsy who presented with status epilepticus.  EEG  for seizures.  Level of alertness: Comatose/sedated  AEDs during EEG study: Keppra, Depakote, Clonazepam, propofol  Technical aspects: This EEG study was done with scalp electrodes positioned according to the 10-20 International system of electrode placement. Electrical activity was acquired at a sampling rate of 500Hz  and reviewed with a high frequency filter of 70Hz  and a low frequency filter of 1Hz . EEG data were recorded continuously and digitally stored.   Description: EEG showed an excessive amount of 15 to 18 Hz, 2-3 uV beta activity with irregular morphology distributed symmetrically and diffusely.  At times high amplitude, intermittent rhythmic generalized 2-3hz  delta activity with overriding 15 to 18 Hz beta activity was noted which would wax and wane without any clinical activity seen on video.  Event button was pressed on Aug 29, 2019 at 0218.  Patient was noted to be getting a wet sponge bath during which he had intermittent axial tonic/flexion and bilateral upper extremity elevation. Concomitant EEG before during and after the event showed intermittent rhythmic 2 to 3 Hz generalized delta slowing with overriding 13 to 15 Hz beta activity without any clear evolution.  Hyperventilation and photic stimulation were not performed.  ABNORMALITY - Intermittent rhythmic delta slow, generalized - Excessive beta, generalized  IMPRESSION:  This study showed intermittent rhythmic delta activity without any clinical symptoms which is on the ictal-interictal continuum.  Additionally, there is evidence of severe diffuse encephalopathy, nonspecific  etiology but could be secondary to sedation.  Event button was passed on Aug 29, 2019 at 0218 as described above.  Concomitant EEG showed generalized rhythmic delta activity without clear evolution.The semiology of the episode was less likely to be a seizure. Clinical correlations is recommended.     Burnie Hank [name]

## 2019-08-15 NOTE — Progress Notes (Signed)
Patient transitioned from Propofol to Precedex per MD order. Pt now SB in the low 40's, down from 80's. Neuro and CCM aware. No changed made to medications. Monitoring closely. Tashi Andujo, Rande Brunt, RN

## 2019-08-16 ENCOUNTER — Inpatient Hospital Stay (HOSPITAL_COMMUNITY): Payer: Medicaid Other

## 2019-08-16 DIAGNOSIS — Z978 Presence of other specified devices: Secondary | ICD-10-CM

## 2019-08-16 DIAGNOSIS — R001 Bradycardia, unspecified: Secondary | ICD-10-CM

## 2019-08-16 LAB — POCT I-STAT 7, (LYTES, BLD GAS, ICA,H+H)
Acid-Base Excess: 2 mmol/L (ref 0.0–2.0)
Acid-base deficit: 2 mmol/L (ref 0.0–2.0)
Bicarbonate: 24 mmol/L (ref 20.0–28.0)
Bicarbonate: 22.3 mmol/L (ref 20.0–28.0)
Calcium, Ion: 1.2 mmol/L (ref 1.15–1.40)
Calcium, Ion: 1.29 mmol/L (ref 1.15–1.40)
HCT: 36 % — ABNORMAL LOW (ref 39.0–52.0)
HCT: 40 % (ref 39.0–52.0)
Hemoglobin: 12.2 g/dL — ABNORMAL LOW (ref 13.0–17.0)
Hemoglobin: 13.6 g/dL (ref 13.0–17.0)
O2 Saturation: 98 %
O2 Saturation: 98 %
Patient temperature: 98.1
Patient temperature: 98.1
Potassium: 3.2 mmol/L — ABNORMAL LOW (ref 3.5–5.1)
Potassium: 3.9 mmol/L (ref 3.5–5.1)
Sodium: 144 mmol/L (ref 135–145)
Sodium: 145 mmol/L (ref 135–145)
TCO2: 25 mmol/L (ref 22–32)
TCO2: 23 mmol/L (ref 22–32)
pCO2 arterial: 29.9 mmHg — ABNORMAL LOW (ref 32.0–48.0)
pCO2 arterial: 35.5 mmHg (ref 32.0–48.0)
pH, Arterial: 7.512 — ABNORMAL HIGH (ref 7.350–7.450)
pH, Arterial: 7.404 (ref 7.350–7.450)
pO2, Arterial: 91 mmHg (ref 83.0–108.0)
pO2, Arterial: 102 mmHg (ref 83.0–108.0)

## 2019-08-16 LAB — GLUCOSE, CAPILLARY
Glucose-Capillary: 88 mg/dL (ref 70–99)
Glucose-Capillary: 85 mg/dL (ref 70–99)
Glucose-Capillary: 86 mg/dL (ref 70–99)
Glucose-Capillary: 81 mg/dL (ref 70–99)
Glucose-Capillary: 82 mg/dL (ref 70–99)

## 2019-08-16 LAB — BASIC METABOLIC PANEL
Anion gap: 13 (ref 5–15)
BUN: 11 mg/dL (ref 6–20)
CO2: 19 mmol/L — ABNORMAL LOW (ref 22–32)
Calcium: 9.4 mg/dL (ref 8.9–10.3)
Chloride: 113 mmol/L — ABNORMAL HIGH (ref 98–111)
Creatinine, Ser: 1 mg/dL (ref 0.61–1.24)
GFR calc Af Amer: >60 mL/min (ref >60)
GFR calc non Af Amer: >60 mL/min (ref >60)
Glucose, Bld: 98 mg/dL (ref 70–99)
Potassium: 3.8 mmol/L (ref 3.5–5.1)
Sodium: 145 mmol/L (ref 135–145)

## 2019-08-16 LAB — MAGNESIUM
Magnesium: 1.7 mg/dL (ref 1.7–2.4)
Magnesium: 1.6 mg/dL — ABNORMAL LOW (ref 1.7–2.4)

## 2019-08-16 LAB — CBC
HCT: 39.8 % (ref 39.0–52.0)
Hemoglobin: 13.7 g/dL (ref 13.0–17.0)
MCH: 30.8 pg (ref 26.0–34.0)
MCHC: 34.4 g/dL (ref 30.0–36.0)
MCV: 89.4 fL (ref 80.0–100.0)
Platelets: 190 10*3/uL (ref 150–400)
RBC: 4.45 MIL/uL (ref 4.22–5.81)
RDW: 13.2 % (ref 11.5–15.5)
WBC: 10 10*3/uL (ref 4.0–10.5)
nRBC: 0 % (ref 0.0–0.2)

## 2019-08-16 LAB — CSF CULTURE W GRAM STAIN: Culture: NO GROWTH

## 2019-08-16 LAB — HSV CULTURE AND TYPING

## 2019-08-16 LAB — TRIGLYCERIDES: Triglycerides: 163 mg/dL — ABNORMAL HIGH (ref <150)

## 2019-08-16 LAB — PHOSPHORUS
Phosphorus: 3.8 mg/dL (ref 2.5–4.6)
Phosphorus: 4.1 mg/dL (ref 2.5–4.6)

## 2019-08-16 MED ORDER — CLONAZEPAM 1 MG PO TABS: 3.0000 mg | ORAL_TABLET | Freq: Three times a day (TID) | ORAL | Status: DC

## 2019-08-16 MED ORDER — MIDAZOLAM HCL 2 MG/2ML IJ SOLN
INTRAMUSCULAR | Status: AC
  Administered 2019-08-16: 5 mg via INTRAVENOUS
  Filled 2019-08-16: qty 6

## 2019-08-16 MED ORDER — MIDAZOLAM HCL 2 MG/2ML IJ SOLN
5.0000 mg | INTRAMUSCULAR | Status: DC | PRN
  Administered 2019-08-16 (×4): 5 mg via INTRAVENOUS
  Filled 2019-08-16 (×4): qty 6

## 2019-08-16 MED ORDER — MIDAZOLAM HCL 2 MG/2ML IJ SOLN: 5.0000 mg | Freq: Once | INTRAMUSCULAR | Status: AC

## 2019-08-16 MED ORDER — LORAZEPAM 2 MG/ML IJ SOLN
2.0000 mg | Freq: Three times a day (TID) | INTRAMUSCULAR | Status: DC
  Administered 2019-08-16 – 2019-08-18 (×6): 2 mg via INTRAVENOUS
  Filled 2019-08-16 (×5): qty 1

## 2019-08-16 MED ORDER — VALPROATE SODIUM 500 MG/5ML IV SOLN
500.0000 mg | Freq: Three times a day (TID) | INTRAVENOUS | Status: DC
  Administered 2019-08-16 – 2019-08-21 (×15): 500 mg via INTRAVENOUS
  Filled 2019-08-16 (×18): qty 5

## 2019-08-16 NOTE — Progress Notes (Addendum)
Reason for consult: Status epilepticus  Subjective:No acute events overnight. Weaned off propofol.   ROS: Unable to obtain due to poor mental status  Examination  Vital signs in last 24 hours: Temp:  [97.8 F (36.6 C)-98.2 F (36.8 C)] 97.8 F (36.6 C) (01/06 0800) Pulse Rate:  [43-121] 106 (01/06 1000) Resp:  [15-22] 18 (01/06 1000) BP: (114-156)/(66-104) 149/93 (01/06 1000) SpO2:  [96 %-100 %] 98 % (01/06 1000) FiO2 (%):  [30 %] 30 % (01/06 0722) Weight:  [75.2 kg] 75.2 kg (01/06 0450)  General: lying in bed, not in apparent distress CVS: pulse-normal rate and rhythm RS: breathing comfortably, intubated Extremities: normal   Neuro:  MS: Opens eyes to verbal stimuli, follows simple commands like raising his arms  CN: Difficult to assess pupil reactivity as patient closes his eyes, was able to track examiner, difficult to assess rest of the cranial nerves due to intubation  Motor: Antigravity strength in all 4 extremities Reflexes: 3+ bilaterally over patella, biceps, plantars: Upgoing bilaterally   Basic Metabolic Panel: Recent Labs  Lab 08/14/19 0603 08/14/19 1706 08/14/19 1819 08/15/19 0309 08/15/19 0427 08/15/19 1644 08/16/19 0432 08/16/19 0508  NA 141 142 142 144 144  --  145 145  K 3.8 4.0 3.5 3.5 3.2*  --  3.9 3.8  CL 104 105 106 109  --   --   --  113*  CO2 24 24 23 24   --   --   --  19*  GLUCOSE 113* 80 82 73  --   --   --  98  BUN 17 16 15 15   --   --   --  11  CREATININE 1.47* 1.30* 1.28* 1.26*  --   --   --  1.00  CALCIUM 9.0 8.9 8.9 8.7*  --   --   --  9.4  MG  --  2.1 2.2 2.0  --  1.8  --  1.7  PHOS  --  2.6  --  3.0  --  3.4  --  3.8    CBC: Recent Labs  Lab 08/13/19 1123 08/14/19 0603 08/14/19 1816 08/15/19 0309 08/15/19 0427 08/16/19 0432 08/16/19 0508  WBC 10.6* 13.5*  --  11.5*  --   --  10.0  NEUTROABS 4.5  --   --   --   --   --   --   HGB 15.0 13.4 12.9* 13.1 12.2* 13.6 13.7  HCT 48.1 39.8 38.0* 40.1 36.0* 40.0 39.8  MCV  98.6 92.1  --  94.4  --   --  89.4  PLT 259 192  --  173  --   --  190     Coagulation Studies: No results for input(s): LABPROT, INR in the last 72 hours.  Imaging CT head without contrast 08/13/2019:Stable right-sided postoperative encephalomalacia and other changes is noted. No acute intracranial abnormality seen.  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 Clinical status resolved with titration of propofol after Keppra and fosphenytoin loading doses.  Status epilepticus (resolved) TBI Epilepsy with breakthrough seizure -Likely etiology for breakthrough seizures: Potential alcohol use with sudden discontinuation. Also unclear if patient was taking his antiepileptics  Recommendations: - Patient required PRN versed and ativan overnight. Therefore will increase clonazepam to 3mg  TID. Plan to gradually wean off clonazepam few days patient is extubated - Continue keppra to 1000mg  BID  and valproic acid 500 mg 3 times  daily - DC LTM EEG as patient remains seizure free - Seizure precautions - As needed IV Versed 5 mg or IV ativan 2mg  for clinical seizure  ADDENDUM -Patient was extubated this afternoon.  Nurse notified me that patient does not have OG tube anymore.  Nurse attempted inserting NG tube but was unable to due to some patient being agitated. -Therefore will transition valproic acid to IV valproic acid 500 mg 3 times daily and switch Klonopin 3 mg 3 times daily to IV Ativan 2 mg 3 times daily (hold for sedation/respiratory distress)   CRITICAL CARE Performed by: Lora Havens  Total critical care time:20minutes  Critical care time was exclusive of separately billable procedures and treating other patients.  Critical care was necessary to treat or prevent imminent or life-threatening deterioration.  Critical care was time spent personally by me on the following activities:  development of treatment plan with patient and/or surrogate as well as nursing, discussions with consultants, evaluation of patient's response to treatment, examination of patient, obtaining history from patient or surrogate, ordering and performing treatments and interventions, ordering and review of laboratory studies, ordering and review of radiographic studies, pulse oximetry and re-evaluation of patient's condition.

## 2019-08-16 NOTE — Procedures (Signed)
Extubation Procedure Note  Patient Details:   Name: [place] DOB: 19-Sep-1995 MRN: [id number]   Airway Documentation:    Vent end date: 08/16/19 Vent end time: 1350   Evaluation  O2 sats: stable throughout Complications: No apparent complications Patient did tolerate procedure well. Bilateral Breath Sounds: Diminished, Rhonchi   Yes   Pt was extubated to 4L [place] at 1350 per order. Pt was suctioned prior and had a positive cuff leak. NP was in the room along with RT and RN. Pt saturations are 95% at this time. Pt was able to say complete sentences afterwards but is agitated at this time. RT will continue to monitor pt status.   Airen Stiehl A Layali Freund 08/16/2019, 2:03 PM

## 2019-08-16 NOTE — Progress Notes (Signed)
Patient is very agitated and actively trying to get out of the bed even with bilateral wrist and possy restraints. Prn artivan given and precedex titrated up with no appreciable sedative   effect on patient. Will continue to monitor and keep patient safe.

## 2019-08-16 NOTE — Procedures (Addendum)
Patient Name:Clinton [name] Epilepsy Attending:Lela Gell [name] Referring Physician/Provider:Dr. Kerney Elbe Duration:08/16/2019 0030 to1/[id number]  Patient history:24 year old male with past medical history of TBI, prior right craniotomy and cranioplasty and epilepsy who presented with status epilepticus. EEG for seizures.  Level of alertness:Comatose/sedated  AEDs during EEG study:Keppra, Depakote, Clonazepam  Technical aspects: This EEG study was done with scalp electrodes positioned according to the 10-20 International system of electrode placement. Electrical activity was acquired at a sampling rate of 500Hz  and reviewed with a high frequency filter of 70Hz  and a low frequency filter of 1Hz . EEG data were recorded continuously and digitally stored.  Description:EEG showed high amplitude continuous rhythmic generalized 2-3hz delta activity with overriding 15 to 18 Hz beta activity.  Hyperventilation and photic stimulation were not performed.  ABNORMALITY - Continuous slow, generalized -Excessive beta, generalized  IMPRESSION: This studyshowedevidence of severe diffuse encephalopathy, nonspecific etiology but could be secondary to sedation.  [name] [name]

## 2019-08-16 NOTE — Progress Notes (Signed)
Attempted to place NGT, per MD order, without success. Dr. Hortense Ramal made aware. Medications to be changed to IV instead of per tube. Order for safety sitter obtained due to patient's increased agitation and mobility.

## 2019-08-16 NOTE — Progress Notes (Signed)
vLTM EEG complete. No skin breakdown 

## 2019-08-16 NOTE — Progress Notes (Signed)
[facility] Progress Note Patient Name: Clinton [name] DOB: 05-20-1996 MRN: [id number]   Date of Service  08/16/2019  HPI/Events of Note  Review of AM CXR reveals ETT < 1 cm above the carina.   eICU Interventions  Will order: 1. Pull ETT back 3 cm and repeat CXR to assess new ETT position.      Intervention Category Major Interventions: Respiratory failure - evaluation and management  Talin Feister Eugene 08/16/2019, 6:30 AM

## 2019-08-16 NOTE — Progress Notes (Signed)
ABG unable to upload into epic from [place].   ABG results from 08/16/2019 at 0419.  Ph: 7.40 PCO2: 35.5 PO2: 102 Bicarb: 22.3 98%

## 2019-08-16 NOTE — Progress Notes (Addendum)
NAME:  Clinton [name], MRN:  [id number], DOB:  06/30/1996, LOS: 3 ADMISSION DATE:  08/13/2019, CONSULTATION DATE:  08/16/19 REFERRING MD:  Long, CHIEF COMPLAINT:  seizure   Brief History   24 y.o. M with PMH of TBI and seizure disorder admitted with status epilepticus in setting of taking half of prescribed dose Keppra.  Intubated.   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 No acute intracranial abnormality seen  Micro Data:  1/3 CSF culture>> 1/3 HSV culture>> 1/3 COVID-19 and influenza>> negative  Antimicrobials:  Vancomycin 1/3 only Ceftriaxone 1/3 only  Interim history/subjective:  No seizure activity identified on most recent EEG reads.  Some issues with agitation overnight Now on precedex 1.2  Objective   Blood pressure (!) 156/97, pulse 93, temperature 98.1 F (36.7 C), temperature source Axillary, resp. rate (!) 22, height 5\' 7"  (1.702 m), weight 75.2 kg, SpO2 100 %.    Vent Mode: PRVC FiO2 (%):  [30 %] 30 % Set Rate:  [20 bmp] 20 bmp Vt Set:  [550 mL] 550 mL PEEP:  [5 cmH20] 5 cmH20 Plateau Pressure:  [16 cmH20-20 cmH20] 20 cmH20   Intake/Output Summary (Last 24 hours) at 08/16/2019 0802 Last data filed at 08/16/2019 0800 Gross per 24 hour  Intake 1242.69 ml  Output 900 ml  Net 342.69 ml   Filed Weights   08/13/19 2214 08/15/19 0500 08/16/19 0450  Weight: 65.4 kg 76.3 kg 75.2 kg   Physical exam: General: young adult male on vent, sedated.  HEENT: [place]/AT, PERRL, No  JVD Neuro: Sedated, RASS -2.  CV: RRR, Nl S1/S2 and -M/R/G PULM: Clear GI: Soft, non-tender, non-distended Extremities: warm/dry, no edema  Skin: Grossly intact.    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   -SE likely due to reducing Keppra, ETOH use -Neurology following appreciate recommendations P: - EEG per Neurology - AEDs: Keppra, Depakote, Clonazepam  - If OK with neurology, would like to work towards extubation.   Acute hypoxic respiratory failure with suspected aspiration -Intubated 1/3 P: -Continue full vent support with lung protective strategies, deep sedation for seizure control prevents SBT/extubation.  - Precedex for vent compliance. RASS goal 0 to -2.  -Abx: see below  Alcohol abuse  -Per patient's father he has been drinking about 80 ounces of beer per day, thinks his last drink was sometime on January 3.  P: -thiamine, folic acid  Concern for pancreatitis 2/2 ETOH use -Elevated lipase with low-grade fever and leukocytosis, patient's father also reports mild pancreatitis several months ago P: -Repeat lipase 20 -Low suspicion for pancreatitis at this time  AKI -Likely secondary to volume depletion, baseline creatinine normal P: -IVF resuscitation with improving creatinine.   Low grade temp: Leukocytosis -purulent secretions from ETT, concern for aspiration. Respiratory culture ordered -LP unremarkable and low suspicion for meningitis -Unasyn for aspiration PNA. Pending CXR   Best practice:  Diet: Tube feeds Pain/Anxiety/Delirium protocol (if indicated): Precedex VAP protocol (if indicated): Yes DVT  prophylaxis: Heparin and SCDs GI prophylaxis: Protonix Glucose control: N/A Mobility: Bedrest Code Status: Full code Family Communication: Message left for father.  Disposition: ICU  Labs   CBC: Recent Labs  Lab 08/13/19 1123  08/13/19 1130 08/14/19 0603 08/14/19 1816 08/15/19 0309 08/16/19 0508  WBC 10.6*  --  13.5*  --  11.5* 10.0  NEUTROABS 4.5  --   --   --   --   --   HGB 15.0 15.3 13.4 12.9* 13.1 13.7  HCT 48.1 45.0 39.8 38.0* 40.1 39.8  MCV 98.6  --  92.1  --  94.4 89.4  PLT 259  --  192  --  173 [id number]    Basic Metabolic Panel: Recent Labs  Lab 08/14/19 0603 08/14/19 1706 08/14/19 1816 08/14/19 1819 08/15/19 0309 08/15/19 1644 08/16/19 0508  NA 141 142 144 142 144  --  145  K 3.8 4.0 3.5 3.5 3.5  --  3.8  CL 104 105  --  106 109  --  113*  CO2 24 24  --  23 24  --  19*  GLUCOSE 113* 80  --  82 73  --  98  BUN 17 16  --  15 15  --  11  CREATININE 1.47* 1.30*  --  1.28* 1.26*  --  1.00  CALCIUM 9.0 8.9  --  8.9 8.7*  --  9.4  MG  --  2.1  --  2.2 2.0 1.8 1.7  PHOS  --  2.6  --   --  3.0 3.4 3.8   GFR: Estimated Creatinine Clearance: 107.4 mL/min (by C-G formula based on SCr of 1 mg/dL). Recent Labs  Lab 08/13/19 1123 08/14/19 0603 08/15/19 0309 08/16/19 0508  WBC 10.6* 13.5* 11.5* 10.0    Liver Function Tests: Recent Labs  Lab 08/13/19 1123 08/14/19 0603  AST 44* 42*  ALT 28 25  ALKPHOS 56 34*  BILITOT 0.7 0.6  PROT 7.8 6.1*  ALBUMIN 4.4 3.5   Recent Labs  Lab 08/13/19 1123 08/14/19 0603  LIPASE 274* 20   No results for input(s): AMMONIA in the last 168 hours.  ABG    Component Value Date/Time   PHART 7.435 08/14/2019 1816   PCO2ART 38.4 08/14/2019 1816   PO2ART 90.0 08/14/2019 1816   HCO3 25.9 08/14/2019 1816   TCO2 27 08/14/2019 1816   ACIDBASEDEF 0.5 09/25/2013 0435   O2SAT 97.0 08/14/2019 1816     Coagulation Profile: No results for input(s): INR, PROTIME in the last 168 hours.  Cardiac Enzymes: Recent Labs  Lab 08/14/19 0603  CKTOTAL 712*    HbA1C: No results found for: HGBA1C  CBG: Recent Labs  Lab 08/15/19 1152 08/15/19 1556 08/15/19 1943 08/15/19 2337 08/16/19 0330  GLUCAP 79 82 93 89 88    Critical care time: 35 mins   Georgann Housekeeper, AGACNP-BC [place] for personal pager PCCM on call pager [phone]  08/16/2019 8:03 AM  Attending Note:  24 year old male with history of TBI and seizure disorder who decreased his Keppra dose and started having seizures now intubated for inability to protect his airway due to sedation to suppress seizures.  On precedex now with episodic bradycardia overnight.  On exam, not weaning with clear lungs.  I reviewed CXR myself, ETT is in a good position.  Discussed with PCCM-NP.  Will begin weaning efforts today and attempt to minimize sedation.  EEG per neurology and seizure control per  neurology.  Replace electrolytes.  AM labs ordered.  PCCM will continue to manage.  The patient is critically ill with multiple organ systems failure and requires high complexity decision making for assessment and support, frequent evaluation and titration of therapies, application of advanced monitoring technologies and extensive interpretation of multiple databases.   Critical Care Time devoted to patient care services described in this note is  31  Minutes. This time reflects time of care of this signee Dr Jennet Maduro. This critical care time does not reflect procedure time, or teaching time or supervisory time of PA/NP/Med student/Med Resident etc but could involve care discussion time.  Rush Farmer, M.D. [facility] Pulmonary/Critical Care Medicine.

## 2019-08-17 ENCOUNTER — Inpatient Hospital Stay (HOSPITAL_COMMUNITY): Payer: Medicaid Other

## 2019-08-17 DIAGNOSIS — R451 Restlessness and agitation: Secondary | ICD-10-CM

## 2019-08-17 DIAGNOSIS — G40001 Localization-related (focal) (partial) idiopathic epilepsy and epileptic syndromes with seizures of localized onset, not intractable, with status epilepticus: Secondary | ICD-10-CM

## 2019-08-17 DIAGNOSIS — R410 Disorientation, unspecified: Secondary | ICD-10-CM

## 2019-08-17 LAB — MAGNESIUM: Magnesium: 1.7 mg/dL (ref 1.7–2.4)

## 2019-08-17 LAB — BASIC METABOLIC PANEL
Anion gap: 15 (ref 5–15)
BUN: 10 mg/dL (ref 6–20)
CO2: 20 mmol/L — ABNORMAL LOW (ref 22–32)
Calcium: 9.1 mg/dL (ref 8.9–10.3)
Chloride: 107 mmol/L (ref 98–111)
Creatinine, Ser: 0.79 mg/dL (ref 0.61–1.24)
GFR calc Af Amer: >60 mL/min (ref >60)
GFR calc non Af Amer: >60 mL/min (ref >60)
Glucose, Bld: 99 mg/dL (ref 70–99)
Potassium: 3.2 mmol/L — ABNORMAL LOW (ref 3.5–5.1)
Sodium: 142 mmol/L (ref 135–145)

## 2019-08-17 LAB — CBC
HCT: 40.2 % (ref 39.0–52.0)
Hemoglobin: 13.7 g/dL (ref 13.0–17.0)
MCH: 30.5 pg (ref 26.0–34.0)
MCHC: 34.1 g/dL (ref 30.0–36.0)
MCV: 89.5 fL (ref 80.0–100.0)
Platelets: 182 10*3/uL (ref 150–400)
RBC: 4.49 MIL/uL (ref 4.22–5.81)
RDW: 12.4 % (ref 11.5–15.5)
WBC: 7.7 10*3/uL (ref 4.0–10.5)
nRBC: 0 % (ref 0.0–0.2)

## 2019-08-17 LAB — GLUCOSE, CAPILLARY
Glucose-Capillary: 85 mg/dL (ref 70–99)
Glucose-Capillary: 98 mg/dL (ref 70–99)
Glucose-Capillary: 94 mg/dL (ref 70–99)

## 2019-08-17 LAB — PHOSPHORUS: Phosphorus: 3.5 mg/dL (ref 2.5–4.6)

## 2019-08-17 LAB — TRIGLYCERIDES: Triglycerides: 191 mg/dL — ABNORMAL HIGH (ref <150)

## 2019-08-17 MED ORDER — KCL IN DEXTROSE-NACL 20-5-0.45 MEQ/L-%-% IV SOLN
INTRAVENOUS | Status: DC
  Filled 2019-08-17 (×2): qty 1000

## 2019-08-17 MED ORDER — CHLORHEXIDINE GLUCONATE 0.12 % MT SOLN
15.0000 mL | Freq: Two times a day (BID) | OROMUCOSAL | Status: DC
  Administered 2019-08-19 – 2019-08-20 (×2): 15 mL via OROMUCOSAL
  Filled 2019-08-17 (×3): qty 15

## 2019-08-17 MED ORDER — CEFAZOLIN SODIUM-DEXTROSE 2-4 GM/100ML-% IV SOLN
2.0000 g | Freq: Three times a day (TID) | INTRAVENOUS | Status: AC
  Administered 2019-08-17 – 2019-08-19 (×7): 2 g via INTRAVENOUS
  Filled 2019-08-17 (×8): qty 100

## 2019-08-17 MED ORDER — ORAL CARE MOUTH RINSE
15.0000 mL | Freq: Two times a day (BID) | OROMUCOSAL | Status: DC
  Administered 2019-08-17 – 2019-08-20 (×8): 15 mL via OROMUCOSAL

## 2019-08-17 MED ORDER — HALOPERIDOL LACTATE 5 MG/ML IJ SOLN
5.0000 mg | Freq: Four times a day (QID) | INTRAMUSCULAR | Status: DC | PRN
  Administered 2019-08-17 (×2): 5 mg via INTRAVENOUS
  Filled 2019-08-17 (×2): qty 1

## 2019-08-17 NOTE — Progress Notes (Signed)
Patient became extremely agitated. He was able to take mittens off and broke out of the right wrist restraint. Skin under restriant  Assessed, no injury found. Reached out to Md earlier to extend restraints time duration.

## 2019-08-17 NOTE — Progress Notes (Addendum)
Reason for consult: Status epilepticus  Subjective: No clinical seizures overnight.  However patient has been intermittently agitated and trying to get out of bed.   ROS: Unable to obtain due to poor mental status  Examination  Vital signs in last 24 hours: Temp:  [97.9 F (36.6 C)-98.6 F (37 C)] 97.9 F (36.6 C) (01/07 0737) Pulse Rate:  [58-169] 58 (01/07 0900) Resp:  [14-31] 25 (01/07 0900) BP: (92-149)/(75-102) 141/99 (01/07 0900) SpO2:  [91 %-100 %] 97 % (01/07 0900) FiO2 (%):  [30 %] 30 % (01/06 1053) Weight:  [73 kg] 73 kg (01/07 0400)  General: lying in bed, not in apparent distress CVS: pulse-normal rate and rhythm RS: breathing comfortably Extremities: normal   Neuro: MS: Opens eyes to repeated verbal stimuli, follows simple one-step commands like raising his arms or legs CN: Pupils difficult to assess as patient closes his eyes, no apparent facial asymmetry, rest of the cranial nerves difficult to assess as patient is unable to/ does not cooperate with exam  Motor: Antigravity strength in all 4 extremities  Basic Metabolic Panel: Recent Labs  Lab 08/14/19 0603 08/14/19 0603 08/14/19 1706 08/14/19 1819 08/15/19 0309 08/15/19 0427 08/15/19 1644 08/16/19 0432 08/16/19 0508 08/16/19 1642  NA 141  --  142 142 144 144  --  145 145  --   K 3.8  --  4.0 3.5 3.5 3.2*  --  3.9 3.8  --   CL 104  --  105 106 109  --   --   --  113*  --   CO2 24  --  24 23 24   --   --   --  19*  --   GLUCOSE 113*  --  80 82 73  --   --   --  98  --   BUN 17  --  16 15 15   --   --   --  11  --   CREATININE 1.47*  --  1.30* 1.28* 1.26*  --   --   --  1.00  --   CALCIUM 9.0  --  8.9 8.9 8.7*  --   --   --  9.4  --   MG  --    < > 2.1 2.2 2.0  --  1.8  --  1.7 1.6*  PHOS  --   --  2.6  --  3.0  --  3.4  --  3.8 4.1   < > = values in this interval not displayed.    CBC: Recent Labs  Lab 08/13/19 1123 08/14/19 0603 08/15/19 0309 08/15/19 0427 08/16/19 0432 08/16/19 0508  08/17/19 0711  WBC 10.6* 13.5* 11.5*  --   --  10.0 7.7  NEUTROABS 4.5  --   --   --   --   --   --   HGB 15.0 13.4 13.1 12.2* 13.6 13.7 13.7  HCT 48.1 39.8 40.1 36.0* 40.0 39.8 40.2  MCV 98.6 92.1 94.4  --   --  89.4 89.5  PLT 259 192 173  --   --  190 182     Coagulation Studies: No results for input(s): LABPROT, INR in the last 72 hours.  Imaging CT head without contrast 08/13/2019:Stable right-sided postoperative encephalomalacia and other changes is noted. No acute intracranial abnormality seen.  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 Clinical status resolved with titration of propofol after Keppra  and fosphenytoin loading doses.  Status epilepticus (resolved) TBI Epilepsy with breakthrough seizure Agitation -Likely etiology for breakthrough seizures: Potential alcohol use with sudden discontinuation.   Recommendations: - Continue keppra to 1000mg  BID, valproic acid 500 mg 3 times daily -Continue Ativan 2 mg every 6 hours with plan to gradually discontinue once patient's agitation improves.  Will transition to p.o. clonazepam 2 mg once patient has p.o. access -Agitation management per critical care team. -Swallow eval pending - Seizure precautions - As needed IV Versed 5 mg or IV ativan 2mg  for clinical seizure  CRITICAL CARE Performed by: Lora Havens  Total critical care time:15minutes  Critical care time was exclusive of separately billable procedures and treating other patients.  Critical care was necessary to treat or prevent imminent or life-threatening deterioration.  Critical care was time spent personally by me on the following activities: development of treatment plan with patient and/or surrogate as well as nursing, discussions with consultants, evaluation of patient's response to treatment, examination of patient, obtaining history from patient or surrogate,  ordering and performing treatments and interventions, ordering and review of laboratory studies, ordering and review of radiographic studies, pulse oximetry and re-evaluation of patient's condition.

## 2019-08-17 NOTE — Progress Notes (Signed)
[facility] Progress Note Patient Name: Clinton [name] DOB: [birth year] MRN: [id number]   Date of Service  08/17/2019  HPI/Events of Note  Agitation - Request to renew bilateral soft wrist restraints.   eICU Interventions  Will order: 1. Bilateral soft wrist restraints x 6 hours.      Intervention Category Major Interventions: Delirium, psychosis, severe agitation - evaluation and management  Coralee Edberg Eugene 08/17/2019, 1:39 AM

## 2019-08-17 NOTE — Progress Notes (Signed)
NAME:  Clinton [name], MRN:  [id number], DOB:  29-Oct-1995, LOS: 4 ADMISSION DATE:  08/13/2019, CONSULTATION DATE:  08/17/19 REFERRING MD:  Long, CHIEF COMPLAINT:  seizure   Brief History   24 y.o. M with PMH of TBI and seizure disorder admitted with status epilepticus in setting of taking half of prescribed dose Keppra.  Intubated.   Past Medical History  ADHD, Anxiety, Bipolar, TBI, HA, Seizures  Significant Hospital Events   1/3-presented to [facility], ED, transferred to [place]:  Neurology  Procedures:  1/3 lumbar puncture  Significant Diagnostic Tests:  1/3 CT head>>Stable right-sided postoperative encephalomalacia and other changes is noted. No acute intracranial abnormality seen.  Micro Data:  1/3 CSF culture>>negative 1/3 HSV culture>> negative 1/3 COVID-19 and influenza>> negative 1/4 sputum >> rare staph aureus  Antimicrobials:  Vancomycin 1/3 only Ceftriaxone 1/3 only  Interim history/subjective:  Remains on precedex.  Objective   Blood pressure (!) 135/97, pulse 67, temperature 97.9 F (36.6 C), temperature source Axillary, resp. rate (!) 22, height 5\' 7"  (1.702 m), weight 73 kg, SpO2 100 %.    Vent Mode: PSV;CPAP FiO2 (%):  [30 %] 30 % PEEP:  [5 cmH20] 5 cmH20 Pressure Support:  [10 cmH20] 10 cmH20   Intake/Output Summary (Last 24 hours) at 08/17/2019 0830 Last data filed at 08/17/2019 0800 Gross per 24 hour  Intake 1408.69 ml  Output 3150 ml  Net -1741.31 ml   Filed Weights   08/15/19 0500 08/16/19 0450 08/17/19 0400  Weight: 76.3 kg 75.2 kg 73 kg   Physical exam:  General - intermittently agitated Eyes - pupils reactive ENT - no sinus tenderness, no stridor Cardiac - regular rate/rhythm, no murmur Chest - equal breath sounds b/l, no wheezing or rales Abdomen - soft, non tender, + bowel sounds Extremities - no cyanosis, clubbing, or edema Skin - no rashes Neuro - wiggling in bed, in restraints, not following  commands   Resolved Hospital Problem list   Acute hypoxic respiratory failure, Acute pancreatitis ruled out, Prerenal azotemia from hypovolemia  Assessment & Plan:   Status epilepticus with hx of seizures. - likely from reducing keppra and ETOH use - continue keppra, depakote - resume clonazepam when able to swallow pills  Agitated delirium with hx of ETOH. Hx of ADHD, Bipolar, Anxiety, TBI. - continue precedex with RASS goal 0 to -1 - prn haldol - thiamine, folic acid - scheduled IV ativan - prn haldol  HCAP with Staph aureus in sputum. - likely MSSA - change ABx to Ancef  Dysphagia. - speech to assess   Best practice:  Diet: NPO DVT prophylaxis: Heparin and SCDs GI prophylaxis: not indicated Mobility: Bedrest Code Status: Full code Disposition: ICU  Labs    CMP Latest Ref Rng & Units 08/16/2019 08/16/2019 08/15/2019  Glucose 70 - 99 mg/dL 98 - -  BUN 6 - 20 mg/dL 11 - -  Creatinine 0.61 - 1.24 mg/dL 1.00 - -  Sodium 135 - 145 mmol/L 145 145 144  Potassium 3.5 - 5.1 mmol/L 3.8 3.9 3.2(L)  Chloride 98 - 111 mmol/L 113(H) - -  CO2 22 - 32 mmol/L 19(L) - -  Calcium 8.9 - 10.3 mg/dL 9.4 - -  Total Protein 6.5 - 8.1 g/dL - - -  Total Bilirubin 0.3 - 1.2 mg/dL - - -  Alkaline Phos 38 - 126 U/L - - -  AST 15 - 41 U/L - - -  ALT 0 - 44 U/L - - -  CBC Latest Ref Rng & Units 08/17/2019 08/16/2019 08/16/2019  WBC 4.0 - 10.5 K/uL 7.7 10.0 -  Hemoglobin 13.0 - 17.0 g/dL 13.7 13.7 13.6  Hematocrit 39.0 - 52.0 % 40.2 39.8 40.0  Platelets 150 - 400 K/uL 182 190 -    ABG    Component Value Date/Time   PHART 7.404 08/16/2019 0432   PCO2ART 35.5 08/16/2019 0432   PO2ART 102.0 08/16/2019 0432   HCO3 22.3 08/16/2019 0432   TCO2 23 08/16/2019 0432   ACIDBASEDEF 2.0 08/16/2019 0432   O2SAT 98.0 08/16/2019 0432    CBG (last 3)  Recent Labs    08/16/19 1935 08/16/19 2323 08/17/19 0345  GLUCAP 81 82 85    CC time 32 minutes  Chesley Mires, MD [facility]  Pulmonary/Critical Care 08/17/2019, 8:38 AM

## 2019-08-17 NOTE — Progress Notes (Signed)
Nutrition Follow-up  DOCUMENTATION CODES:   Not applicable  INTERVENTION:   If unable to take PO safely recommend Cortrak placement Initiate Vital 1.5 @ 65 ml/hr (1560 ml/day)  Provides: 2340 kcal, 105 grams protein, and 1188 ml free water.   NUTRITION DIAGNOSIS:   Inadequate oral intake related to inability to eat as evidenced by NPO status.  GOAL:   Patient will meet greater than or equal to 90% of their needs  MONITOR:   TF tolerance, Vent status  REASON FOR ASSESSMENT:   Consult, Ventilator Enteral/tube feeding initiation and management  ASSESSMENT:   Pt with PMH of TBI, sz, anxiety, and bipolar disorder who lost his mother Nov 2020 and has been drinking heavily (80 oz) and ran out of his sz medication recently now admitted with seizures. Also positive for benzo's and THC.   Pt remains agitated. Swallow eval pending.   1/6 extubated  Medications reviewed and include: colace, thiamine, folic acid Labs reviewed    NUTRITION - FOCUSED PHYSICAL EXAM:  Deferred   Diet Order:   Diet Order            Diet NPO time specified  Diet effective now              EDUCATION NEEDS:   No education needs have been identified at this time  Skin:  Skin Assessment: Reviewed RN Assessment  Last BM:  1/7  Height:   Ht Readings from Last 1 Encounters:  08/13/19 5\' 7"  (1.702 m)    Weight:   Wt Readings from Last 1 Encounters:  08/17/19 73 kg    Ideal Body Weight:  67.2 kg  BMI:  Body mass index is 25.21 kg/m.  Estimated Nutritional Needs:   Kcal:  2100-2400  Protein:  95-115 grams  Fluid:  >2 L/day  Maylon Peppers RD, LDN, CNSC [phone] Pager [phone] After Hours Pager

## 2019-08-17 NOTE — Progress Notes (Signed)
SLP Cancellation Note  Patient Details Name: FINLEY WHEATLEY MRN: [id number] DOB: 30-Mar-1996   Cancelled treatment:       Reason Eval/Treat Not Completed: Patient unavailable - bed soiled, pt slightly agitated and nursing attempting to bathe him/change linens.  Will f/u next date.    [name] 08/17/2019, 4:46 PM

## 2019-08-17 NOTE — Progress Notes (Signed)
[facility] Progress Note Patient Name: Clinton [name] DOB: [birth year] MRN: [id number]   Date of Service  08/17/2019  HPI/Events of Note  Delirium - Currently on Ativan IV PRN, Haldo IV PRN and Precedex IV infusion.   eICU Interventions  Will order: 1. Increase ceiling on Precedex IV infusion to 1.6 mcg/kg/min.     Intervention Category Major Interventions: Delirium, psychosis, severe agitation - evaluation and management  Airlie Blumenberg Eugene 08/17/2019, 10:38 PM

## 2019-08-17 NOTE — Progress Notes (Signed)
Pharmacy Antibiotic Note  Clinton [name] is a 24 y.o. male admitted on 08/13/2019 with uncontrolled seizure.  Patient was started on Unasyn for aspiration PNA and Pharmacy now consulted to narrow to Ancef with MSSA growing in tracheal aspirate preliminary culture.  Renal function is improving, afebrile, WBC normalized.  Plan:  Clinton [name] will sign off and follow peripherally.  Thank you for the consult!   Height: 5\' 7"  (170.2 cm) Weight: 160 lb 15 oz (73 kg) IBW/kg (Calculated) : 66.1  Temp (24hrs), Avg:98.2 F (36.8 C), Min:97.9 F (36.6 C), Max:98.6 F (37 C)  Recent Labs  Lab 08/13/19 1123 08/14/19 0603 08/14/19 1706 08/14/19 1819 08/15/19 0309 08/16/19 0508 08/17/19 0711  WBC 10.6* 13.5*  --   --  11.5* 10.0 7.7  CREATININE 1.72* 1.47* 1.30* 1.28* 1.26* 1.00  --     Estimated Creatinine Clearance: 107.4 mL/min (by C-G formula based on SCr of 1 mg/dL).    Allergies  Allergen Reactions  . Seroquel [Quetiapine Fumarate] Other (See Comments)    Suicidal on this med in past - mother wants to make sure this medication is never given again  . Morphine And Related Hives and Itching    Pt had reaction to 2mg  morphine given through his PIV. About one minute after injection, the patient's arm began to redden with hives appearing from wrist to mid upper arm. Mostly anterior. Pt also began itching at forearm.      Vanc 1/3 >> 1/3 CTX 1/3 >> 1/3 Acyclovir 1/3 >> 1/3 Unasyn 1/4 >> 1/7 Ancef 1/7 >>  1/3 covid / flu / MRSA PCR - negative 1/3 CSF - negative 1/4 TA - Staph aureus (preliminary)  Vernia Teem D. Mina Marble, PharmD, BCPS, [place] 08/17/2019, 9:32 AM

## 2019-08-17 NOTE — Progress Notes (Signed)
[facility] Progress Note Patient Name: Clinton [name] DOB: 1996/06/17 MRN: [id number]   Date of Service  08/17/2019  HPI/Events of Note  Patient no longer on mechanical ventilation. Request to D/C ABG in AM.  eICU Interventions  Will D/C ABG in AM.     Intervention Category Major Interventions: Other:  Sharlee Rufino Cornelia Copa 08/17/2019, 12:00 AM

## 2019-08-18 DIAGNOSIS — J189 Pneumonia, unspecified organism: Secondary | ICD-10-CM

## 2019-08-18 LAB — CULTURE, RESPIRATORY W GRAM STAIN

## 2019-08-18 LAB — CBC
HCT: 42.5 % (ref 39.0–52.0)
Hemoglobin: 14.7 g/dL (ref 13.0–17.0)
MCH: 30.6 pg (ref 26.0–34.0)
MCHC: 34.6 g/dL (ref 30.0–36.0)
MCV: 88.4 fL (ref 80.0–100.0)
Platelets: 206 10*3/uL (ref 150–400)
RBC: 4.81 MIL/uL (ref 4.22–5.81)
RDW: 12.1 % (ref 11.5–15.5)
WBC: 7.4 10*3/uL (ref 4.0–10.5)
nRBC: 0 % (ref 0.0–0.2)

## 2019-08-18 LAB — BASIC METABOLIC PANEL
Anion gap: 12 (ref 5–15)
BUN: 7 mg/dL (ref 6–20)
CO2: 21 mmol/L — ABNORMAL LOW (ref 22–32)
Calcium: 9.2 mg/dL (ref 8.9–10.3)
Chloride: 107 mmol/L (ref 98–111)
Creatinine, Ser: 0.72 mg/dL (ref 0.61–1.24)
GFR calc Af Amer: >60 mL/min (ref >60)
GFR calc non Af Amer: >60 mL/min (ref >60)
Glucose, Bld: 112 mg/dL — ABNORMAL HIGH (ref 70–99)
Potassium: 3.5 mmol/L (ref 3.5–5.1)
Sodium: 140 mmol/L (ref 135–145)

## 2019-08-18 MED ORDER — BISACODYL 10 MG RE SUPP: 10.0000 mg | Freq: Every day | RECTAL | Status: DC | PRN

## 2019-08-18 NOTE — Progress Notes (Signed)
Reason for consult: Status epilepticus  Subjective: Patient continued to be significantly agitated overnight requiring increasing dose of Precedex and restraints.  This morning, he was calm and able to answer questions appropriately.  He denied any headache or any other pain.  Requested me to call his mom and provided me with his number.  On speaking with nurse, patient's agitation fluctuates throughout the day.  She also told me that unfortunately patient's mom passed away a while ago..   ROS: negative except above  Examination  Vital signs in last 24 hours: Temp:  [97.7 F (36.5 C)-98 F (36.7 C)] 97.9 F (36.6 C) (01/08 0800) Pulse Rate:  [105] 105 (01/07 1600) Resp:  [16-29] 27 (01/08 0600) BP: (130-159)/(70-132) 147/76 (01/08 0600) SpO2:  [97 %] 97 % (01/07 1200) Weight:  [70.7 kg] 70.7 kg (01/08 0455)  General: lying in bed, not in apparent distress CVS: pulse-normal rate and rhythm RS: breathing comfortably Extremities: normal   Neuro: MS: Opens eyes to verbal stimuli, able to tell me his name and where he is, not oriented to time, follows simple one-step commands CN: pupils equal and reactive,  EOMI, face symmetric, tongue midline, normal sensation over face, Motor: Antigravity strength in all 4 extremities  Basic Metabolic Panel: Recent Labs  Lab 08/14/19 1819 08/15/19 0309 08/15/19 0427 08/15/19 1644 08/16/19 0432 08/16/19 0508 08/16/19 1642 08/17/19 0711 08/18/19 0625  NA 142 144 144  --  145 145  --  142 140  K 3.5 3.5 3.2*  --  3.9 3.8  --  3.2* 3.5  CL 106 109  --   --   --  113*  --  107 107  CO2 23 24  --   --   --  19*  --  20* 21*  GLUCOSE 82 73  --   --   --  98  --  99 112*  BUN 15 15  --   --   --  11  --  10 7  CREATININE 1.28* 1.26*  --   --   --  1.00  --  0.79 0.72  CALCIUM 8.9 8.7*  --   --   --  9.4  --  9.1 9.2  MG 2.2 2.0  --  1.8  --  1.7 1.6* 1.7  --   PHOS  --  3.0  --  3.4  --  3.8 4.1 3.5  --     CBC: Recent Labs  Lab  08/13/19 1123 08/14/19 0603 08/15/19 0309 08/15/19 0427 08/16/19 0432 08/16/19 0508 08/17/19 0711 08/18/19 0625  WBC 10.6* 13.5* 11.5*  --   --  10.0 7.7 7.4  NEUTROABS 4.5  --   --   --   --   --   --   --   HGB 15.0 13.4 13.1 12.2* 13.6 13.7 13.7 14.7  HCT 48.1 39.8 40.1 36.0* 40.0 39.8 40.2 42.5  MCV 98.6 92.1 94.4  --   --  89.4 89.5 88.4  PLT 259 192 173  --   --  190 182 206     Coagulation Studies: No results for input(s): LABPROT, INR in the last 72 hours.  Imaging CT head without contrast 08/13/2019:Stable right-sided postoperative encephalomalacia and other changes is noted. No acute intracranial abnormality seen.  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 Clinical status resolved with titration of propofol after Keppra and fosphenytoin loading doses.  Status epilepticus (resolved) TBI Epilepsy with breakthrough seizure Agitation -Likely etiology for breakthrough seizures: Potential alcohol use with sudden discontinuation.   Recommendations: - Continuekeppra to 1000mg  BID,valproic acid 500 mg 3 times daily -Patient has been receiving as needed Ativan as well as Haldol.  Therefore will discontinue scheduled IV Ativan. -Agitation management per critical care team. - Seizure precautions - As needed IV Versed 5 mgor IV ativan 2mg for clinical seizure -Follow-up with neurology after discharge in 4 to 8 weeks.  Neurology will follow peripherally.  Please call neuro hospitalist for any further questions.  CRITICAL CARE Performed by: Lora Havens  Total critical care time:56minutes  Critical care time was exclusive of separately billable procedures and treating other patients.  Critical care was necessary to treat or prevent imminent or life-threatening deterioration.  Critical care was time spent personally by me on the following activities: development of treatment  plan with patient and/or surrogate as well as nursing, discussions with consultants, evaluation of patient's response to treatment, examination of patient, obtaining history from patient or surrogate, ordering and performing treatments and interventions, ordering and review of laboratory studies, ordering and review of radiographic studies, pulse oximetry and re-evaluation of patient's condition.

## 2019-08-18 NOTE — Evaluation (Signed)
Clinical/Bedside Swallow Evaluation Patient Details  Name: Clinton [name] MRN: [id number] Date of Birth: March 29, 1996  Today's Date: 08/18/2019 Time: SLP Start Time (ACUTE ONLY): [id number] SLP Stop Time (ACUTE ONLY): 0939 SLP Time Calculation (min) (ACUTE ONLY): 18 min  Past Medical History:  Past Medical History:  Diagnosis Date  . ADHD (attention deficit hyperactivity disorder)   . Anxiety   . Bipolar disorder ([place])   . 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 Headache(784.0)   . Memory loss, short term   . Seizures ([place])    Past Surgical History:  Past Surgical History:  Procedure Laterality Date  . CRANIOPLASTY Right 01/19/2014   Procedure: Right Cranioplasty with replacement of bone flap from abdomen;  Surgeon: Erline Levine, MD;  Location: [facility]URO [facility]S;  Service: Neurosurgery;  Laterality: Right;  Right Cranioplasty with replacement of bone flap from abdomen  . CRANIOTOMY  09/22/2013   Procedure: Decompressive Craniectomy with ICP monitor placement and bone flap placement into abdomen;  Surgeon: Erline Levine, MD;  Location: [facility] NEURO ORS;  Service: Neurosurgery;;  Decompressive Craniectomy with ICP monitor placement and bone flap placement into abdomen  . ESOPHAGOGASTRODUODENOSCOPY N/A 10/02/2013   Procedure: ESOPHAGOGASTRODUODENOSCOPY (EGD);  Surgeon: Gwenyth Ober, MD;  [facility];  Service: General;  Laterality: N/A;  . ORIF CLAVICULAR FRACTURE Right 07/09/2015   Procedure: OPEN REDUCTION INTERNAL FIXATION (ORIF) CLAVICULAR FRACTURE;  Surgeon: Renette Butters, MD;  Location: [facility];  Service: Orthopedics;  Laterality: Right;  . PEG PLACEMENT N/A 10/02/2013   Procedure: PERCUTANEOUS ENDOSCOPIC GASTROSTOMY (PEG) PLACEMENT;  Surgeon: Gwenyth Ober, MD;  Location: [facility];  Service: General;  Laterality: N/A;  . PERCUTANEOUS TRACHEOSTOMY N/A 10/02/2013   Procedure: [place];  Surgeon: Gwenyth Ober, MD;  Location: [facility];   Service: General;  Laterality: N/A;   HPI:  24 y.o. male with PMH of TBI and seizure disorder admitted with status epilepticus in setting of taking half of prescribed dose of Keppra. Notes from ED state his mother died in 23-Jul-2023 and that pt has been drinking more heavily since her passing.  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 Intubated in ED 1/3-1/7. CXR 1/7 with lungs clear   Assessment / Plan / Recommendation Clinical Impression  Pt presents with clinical indicators of pharyngeal dysphagia in setting of active seizing and VDRF requiring 4 day intubation.   Pt had wet vocal quality on SLP arrival with copious thick secretions in oral cavity.  SLP provided extensive oral care to attempt to remove secretions.  Suspect secretions persisting in pharynx as well.  With ice chips there were multiple swallows, decreased laryngeal elevation, and throat clearing.  These symptoms increased in severity with small amount of water by teaspoon and included wet vocal quality and immediate cough.    Recommend pt remain NPO with alternate means of nutrition, hydration, and medication.   Pt may have ice chips for comfort and to help mobilize secretions in moderate, after good oral care, when fully awake/alert, with upright positioning and direct supervision.   Consider RT consult to assist with secretion management and/or provide NT suctioning.  SLP will follow for PO readiness and/or instrumental evaluation.  SLP Visit Diagnosis: Dysphagia, oropharyngeal phase (R13.12)    Aspiration Risk  Moderate aspiration risk;Severe aspiration risk    Diet Recommendation NPO;Alternative means - temporary        Other  Recommendations Oral Care Recommendations: Oral care QID;Oral care prior to ice chip/H20;Oral care before and after PO   Follow up Recommendations (TBD)      Frequency and Duration min 2x/week  1 week        Prognosis Prognosis for Safe Diet Advancement: Good      Swallow Study   General HPI: 24 y.o. male with PMH of TBI and seizure disorder admitted with status epilepticus in setting of taking half of prescribed dose of Keppra. Notes from ED state his mother died in 2023-07-11 and that pt has been drinking more heavily since her passing.  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 Intubated in ED 1/3-1/7. CXR 1/7 with lungs clear Type of Study: Bedside Swallow Evaluation Previous Swallow Assessment: seen previously by service following TBI Diet Prior to this Study: NPO Temperature Spikes Noted: No Respiratory Status: Nasal cannula History of Recent Intubation: Yes Length of Intubations (days): 4 days Date extubated: 08/17/19 Behavior/Cognition: Alert;Cooperative Oral Cavity Assessment: Excessive secretions;Dried secretions Oral Care Completed by SLP: Yes Oral Cavity - Dentition: Adequate natural dentition Self-Feeding Abilities: Total assist Patient Positioning: Upright in bed Baseline Vocal Quality: Wet Volitional Swallow: Able to elicit    Oral/Motor/Sensory Function Overall Oral Motor/Sensory Function: Moderate impairment Facial ROM: Reduced right;Reduced left Lingual ROM: Reduced right;Reduced left Lingual Strength: Reduced Mandible: (Reduced)   Ice Chips Ice chips: Impaired Pharyngeal Phase Impairments: Suspected delayed Swallow;Decreased hyoid-laryngeal movement;Multiple swallows;Wet Vocal Quality;Cough - Delayed   Thin Liquid Thin Liquid: Impaired Presentation: Spoon Pharyngeal  Phase Impairments: Suspected delayed Swallow;Decreased hyoid-laryngeal movement;Multiple swallows;Cough - Immediate;Throat Clearing - Immediate;Wet Vocal Quality    Nectar Thick Nectar Thick Liquid: Not tested   Honey Thick Honey Thick Liquid: Not tested   Puree Puree: Not tested   Solid     Solid: Not tested       Celedonio Savage, MA, [place] Office: [phone]  08/18/2019,10:42 AM

## 2019-08-18 NOTE — Evaluation (Addendum)
Physical Therapy Evaluation Patient Details Name: Clinton [name] MRN: [id number] DOB: [birth year] Today's Date: 08/18/2019   History of Present Illness  24 y.o. male with PMH of TBI and seizure disorder admitted with status epilepticus in setting of taking half of prescribed dose of Keppra. Notes from ED state his mother died in 07-06-23 and that pt has been drinking more heavily since her passing.  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 Intubated in ED 1/3-1/7. CXR 1/7 with lungs clear   Clinical Impression  Pt admitted for above. He presents with deficits in strength, balance, gait, coordination, cognition, and overall functional mobility. He was able to complete bed mobility with min guard, transfers min assist +2, and gait min assist +2 as described below. Difficult to assess changes in cognition as he has history of difficulties and no one was present to give baseline. He has difficulty with sequencing, decreased safety awareness, and impulsivity with movement, and required cueing for safety and hand placement throughout session. Called father to confirm information about support and home set up. He corrected certain aspects of the home set-up that pt was confused about and confirmed that he and pt's sister would be available to assist. Secure-chatted MD about father's reques to discuss pt's plan of care. Recommend HH PT for further improvement of impairments following discharge. Pt would benefit from continued skilled PT intervention to address deficits.     Follow Up Recommendations Home health PT;Supervision/Assistance - 24 hour    Equipment Recommendations  Other (comment)(TBA next session; limited gait today)    Recommendations for Other Services       Precautions / Restrictions Precautions Precautions: Fall Restrictions Weight Bearing Restrictions: No      Mobility  Bed Mobility Overal bed  mobility: Needs Assistance Bed Mobility: Supine to Sit     Supine to sit: Min guard;HOB elevated     General bed mobility comments: min guard for safety; pt able to manage LEs and trunk to EOB without assistance; increased time and effort; some impulsivity- was asked to wait until set up was complete and needed cueing to keep waiting  Transfers Overall transfer level: Needs assistance Equipment used: None;2 person hand held assist Transfers: Sit to/from Stand Sit to Stand: Min assist;+2 physical assistance         General transfer comment: completed x2 once from bed, once from toilet; 2 person HHA, with min assist for safety and sequencing  Ambulation/Gait Ambulation/Gait assistance: Min assist;+2 physical assistance Gait Distance (Feet): 30 Feet (15 ft x 2) Assistive device: 2 person hand held assist;IV Pole;None Gait Pattern/deviations: Step-through pattern;Decreased stride length;Shuffle;Trunk flexed;Narrow base of support;Ataxic Gait velocity: decr   General Gait Details: pt ambulated to bathroom and back to bed with 2 person hand held assist initially, then took a few steps without hand held assist before holding onto IV pole for remainder of walk to bathroom; pt's gait slightly ataxic with narrow BOS including some shuffled steps; cues for posture and safety  Stairs            Wheelchair Mobility    Modified Rankin (Stroke Patients Only)       Balance Overall balance assessment: Needs assistance Sitting-balance support: Single extremity supported;Feet supported Sitting balance-Leahy Scale: Fair Sitting balance - Comments: EOB with feet on floor; fair balance but min guard for safety   Standing balance support: During functional activity;Single extremity supported;Bilateral upper extremity supported Standing balance-Leahy Scale: Poor  Standing balance comment: would not tolerate challenge; min guard for safety                              Pertinent Vitals/Pain Pain Assessment: No/denies pain Faces Pain Scale: No hurt    Home Living Family/patient expects to be discharged to:: Private residence Living Arrangements: Parent;Other relatives Available Help at Discharge: Family Type of Home: House Home Access: Stairs to enter Entrance Stairs-Rails: None Entrance Stairs-Number of Steps: 1 Home Layout: One level Home Equipment: Shower seat      Prior Function Level of Independence: Independent               Hand Dominance   Dominant Hand: Right    Extremity/Trunk Assessment   Upper Extremity Assessment Upper Extremity Assessment: Generalized weakness;Overall [facility] for tasks assessed    Lower Extremity Assessment Lower Extremity Assessment: Overall WFL for tasks assessed       Communication   Communication: Expressive difficulties(speech slurred; able to communicate but difficult to understand)  Cognition Arousal/Alertness: Lethargic Behavior During Therapy: Flat affect Overall Cognitive Status: No family/caregiver present to determine baseline cognitive functioning                                 General Comments: decreased safety awareness; impulsivity with movement; followed commands and answered questions but with increased time      General Comments      Exercises     Assessment/Plan    PT Assessment Patient needs continued PT services  PT Problem List Decreased strength;Decreased range of motion;Decreased activity tolerance;Decreased balance;Decreased mobility;Decreased knowledge of use of DME;Decreased safety awareness;Pain;Decreased knowledge of precautions;Decreased cognition;Decreased coordination       PT Treatment Interventions DME instruction;Gait training;Stair training;Functional mobility training;Therapeutic activities;Therapeutic exercise;Balance training;Neuromuscular re-education;Patient/family education;Modalities;Cognitive remediation;Wheelchair mobility  training    PT Goals (Current goals can be found in the Care Plan section)  Acute Rehab PT Goals Patient Stated Goal: go home PT Goal Formulation: With patient Time For Goal Achievement: 09/01/19 Potential to Achieve Goals: Good    Frequency Min 3X/week   Barriers to discharge        Co-evaluation               AM-PAC PT "6 Clicks" Mobility  Outcome Measure Help needed turning from your back to your side while in a flat bed without using bedrails?: None Help needed moving from lying on your back to sitting on the side of a flat bed without using bedrails?: None Help needed moving to and from a bed to a chair (including a wheelchair)?: A Little Help needed standing up from a chair using your arms (e.g., wheelchair or bedside chair)?: A Little Help needed to walk in hospital room?: A Little Help needed climbing 3-5 steps with a railing? : A Lot 6 Click Score: 19    End of Session Equipment Utilized During Treatment: Gait belt Activity Tolerance: Patient tolerated treatment well;Patient limited by lethargy Patient left: in bed;with call bell/phone within reach;with nursing/sitter in room;with restraints reapplied Nurse Communication: Mobility status PT Visit Diagnosis: Unsteadiness on feet (R26.81);Other abnormalities of gait and mobility (R26.89);Muscle weakness (generalized) (M62.81);Ataxic gait (R26.0);Pain    Time: [id number] PT Time Calculation (min) (ACUTE ONLY): 38 min   Charges:   PT Evaluation $PT Eval Moderate Complexity: 1 Mod PT Treatments $Gait Training: 8-22 mins $Therapeutic Activity: 8-22 mins  Christel Mormon, SPT   Kolson Chovanec 08/18/2019, 1:22 PM

## 2019-08-18 NOTE — Progress Notes (Signed)
NAME:  Clinton [name], MRN:  [id number], DOB:  06-Jun-1996, LOS: 5 ADMISSION DATE:  08/13/2019, CONSULTATION DATE:  08/18/19 REFERRING MD:  Long, CHIEF COMPLAINT:  seizure   Brief History   24 y.o. M with PMH of TBI and seizure disorder admitted with status epilepticus in setting of taking half of prescribed dose Keppra.  Intubated.   Past Medical History  ADHD, Anxiety, Bipolar, TBI, HA, Seizures  Significant Hospital Events   1/3-presented to [facility] ED, transferred to [place]:  Neurology  Procedures:  1/3 lumbar puncture  Significant Diagnostic Tests:  1/3 CT head>>Stable right-sided postoperative encephalomalacia and other changes is noted. No acute intracranial abnormality seen.  Micro Data:  1/3 CSF culture>>negative 1/3 HSV culture>> negative 1/3 COVID-19 and influenza>> negative 1/4 sputum >> rare staph aureus  Antimicrobials:  Vancomycin 1/3 only Ceftriaxone 1/3 only  Interim history/subjective:  Continued uncontrolled agitation overnight, improved with medication adjustment.   Objective   Blood pressure (!) 147/76, pulse (!) 105, temperature 97.8 F (36.6 C), temperature source Oral, resp. rate (!) 27, height 5\' 7"  (1.702 m), weight 70.7 kg, SpO2 97 %.        Intake/Output Summary (Last 24 hours) at 08/18/2019 0814 Last data filed at 08/18/2019 0600 Gross per 24 hour  Intake 607.03 ml  Output 1725 ml  Net -1117.97 ml   Filed Weights   08/16/19 0450 08/17/19 0400 08/18/19 0455  Weight: 75.2 kg 73 kg 70.7 kg   Physical exam: General: Chronically ill appearing elderly male, lying in bed in NAD HEENT: [place]?AT, MM pink/moist, PERRL,  Neuro: Sedated on precedex drip, agitation improved this am  CV: Sinus bradycardia s1s2 regular rate and rhythm, no murmur, rubs, or gallops,  PULM: Clear to ascultation bilaterally, no added breath sounds  GI: soft, bowel sounds active in all 4 quadrants, non-tender, non-distended Extremities: warm/dry, no edema,  5 point restraints  Skin: no rashes or lesions   Resolved Hospital Problem list   Acute hypoxic respiratory failure, Acute pancreatitis ruled out, Prerenal azotemia from hypovolemia  Assessment & Plan:   Status epilepticus with hx of seizures. - likely from reducing keppra and ETOH use Plan: Neurology following, appreciate assistance  Continue IV Keppra and Valporic acid  Seizure precautions Frequent neuro assessments Will need to resume Clonazepam once able to take oral medications   Agitated delirium with hx of ETOH. Hx of ADHD, Bipolar, Anxiety, TBI. Plan: Continue Precedex with RASS goal 0 to -1 PRN haldol  CIWA  Supplemental Thiamine, Folic acid, and multivitamin  Scheduled ativan  Continue safety restraints   HCAP with Staph aureus in sputum. -Sputum culture 01/04 with rare staph aures, susceptibilities pending  Plan: Continue IV Ancef  Follow cultures  Encourage pulmonary hygiene   Dysphagia. Plan: Speach therapy follow, formal evaluation pending  NPO    Best practice:  Diet: NPO DVT prophylaxis: Heparin and SCDs GI prophylaxis: not indicated Mobility: Bedrest Code Status: Full code Disposition: ICU  Labs    CMP Latest Ref Rng & Units 08/18/2019 08/17/2019 08/16/2019  Glucose 70 - 99 mg/dL 112(H) 99 98  BUN 6 - 20 mg/dL 7 10 11   Creatinine 0.61 - 1.24 mg/dL 0.72 0.79 1.00  Sodium 135 - 145 mmol/L 140 142 145  Potassium 3.5 - 5.1 mmol/L 3.5 3.2(L) 3.8  Chloride 98 - 111 mmol/L 107 107 113(H)  CO2 22 - 32 mmol/L 21(L) 20(L) 19(L)  Calcium 8.9 - 10.3 mg/dL 9.2 9.1 9.4  Total Protein 6.5 -  8.1 g/dL - - -  Total Bilirubin 0.3 - 1.2 mg/dL - - -  Alkaline Phos 38 - 126 U/L - - -  AST 15 - 41 U/L - - -  ALT 0 - 44 U/L - - -    CBC Latest Ref Rng & Units 08/18/2019 08/17/2019 08/16/2019  WBC 4.0 - 10.5 K/uL 7.4 7.7 10.0  Hemoglobin 13.0 - 17.0 g/dL 14.7 13.7 13.7  Hematocrit 39.0 - 52.0 % 42.5 40.2 39.8  Platelets 150 - 400 K/uL 206 182 190    ABG     Component Value Date/Time   PHART 7.404 08/16/2019 0432   PCO2ART 35.5 08/16/2019 0432   PO2ART 102.0 08/16/2019 0432   HCO3 22.3 08/16/2019 0432   TCO2 23 08/16/2019 0432   ACIDBASEDEF 2.0 08/16/2019 0432   O2SAT 98.0 08/16/2019 0432    CBG (last 3)  Recent Labs    08/17/19 0345 08/17/19 1205 08/17/19 1517  GLUCAP 85 98 94    CRITICAL CARE Performed by: Johnsie Cancel   Total critical care time: 37 minutes  Critical care time was exclusive of separately billable procedures and treating other patients.  Critical care was necessary to treat or prevent imminent or life-threatening deterioration.  Critical care was time spent personally by me on the following activities: development of treatment plan with patient and/or surrogate as well as nursing, discussions with consultants, evaluation of patient's response to treatment, examination of patient, obtaining history from patient or surrogate, ordering and performing treatments and interventions, ordering and review of laboratory studies, ordering and review of radiographic studies, pulse oximetry and re-evaluation of patient's condition.  Johnsie Cancel, NP-C [facility] Contact / Pager information can be found on Amion  08/18/2019, 8:21 AM

## 2019-08-18 NOTE — Plan of Care (Signed)
  Problem: Activity: Goal: Risk for activity intolerance will decrease Outcome: Progressing Pt worked well with physical therapy   Problem: Elimination: Goal: Will not experience complications related to bowel motility Outcome: Progressing Goal: Will not experience complications related to urinary retention Outcome: Progressing   Problem: Nutrition: Goal: Adequate nutrition will be maintained Outcome: Not Progressing Pt does not have a diet due to coughing with speech   Problem: Pain Managment: Goal: General experience of comfort will improve Outcome: Progressing   Problem: Safety: Goal: Verbalization of understanding the information provided will improve Outcome: Progressing   Problem: Self-Concept: Goal: Level of anxiety will decrease Outcome: Progressing

## 2019-08-18 NOTE — Progress Notes (Signed)
Pt tolerated 4-5 ice chips without difficulty or complications.

## 2019-08-19 ENCOUNTER — Inpatient Hospital Stay (HOSPITAL_COMMUNITY): Payer: Medicaid Other

## 2019-08-19 DIAGNOSIS — Z9289 Personal history of other medical treatment: Secondary | ICD-10-CM

## 2019-08-19 LAB — CBC
HCT: 43.5 % (ref 39.0–52.0)
Hemoglobin: 15.4 g/dL (ref 13.0–17.0)
MCH: 31.2 pg (ref 26.0–34.0)
MCHC: 35.4 g/dL (ref 30.0–36.0)
MCV: 88.1 fL (ref 80.0–100.0)
Platelets: 201 10*3/uL (ref 150–400)
RBC: 4.94 MIL/uL (ref 4.22–5.81)
RDW: 12.3 % (ref 11.5–15.5)
WBC: 7.2 10*3/uL (ref 4.0–10.5)
nRBC: 0 % (ref 0.0–0.2)

## 2019-08-19 LAB — BASIC METABOLIC PANEL
Anion gap: 10 (ref 5–15)
BUN: 9 mg/dL (ref 6–20)
CO2: 26 mmol/L (ref 22–32)
Calcium: 9.4 mg/dL (ref 8.9–10.3)
Chloride: 105 mmol/L (ref 98–111)
Creatinine, Ser: 0.95 mg/dL (ref 0.61–1.24)
GFR calc Af Amer: >60 mL/min (ref >60)
GFR calc non Af Amer: >60 mL/min (ref >60)
Glucose, Bld: 93 mg/dL (ref 70–99)
Potassium: 3.5 mmol/L (ref 3.5–5.1)
Sodium: 141 mmol/L (ref 135–145)

## 2019-08-19 LAB — HSV DNA BY PCR (REFERENCE LAB)
HSV 1 DNA: NEGATIVE
HSV 2 DNA: NEGATIVE

## 2019-08-19 MED ORDER — LACTATED RINGERS IV SOLN: INTRAVENOUS | Status: AC

## 2019-08-19 MED ORDER — LORAZEPAM 2 MG/ML IJ SOLN: 1.0000 mg | INTRAMUSCULAR | Status: DC | PRN

## 2019-08-19 NOTE — Progress Notes (Signed)
Modified Barium Swallow Progress Note  Patient Details  Name: Clinton [name] MRN: [id number] Date of Birth: 03/08/1996  Today's Date: 08/19/2019  Modified Barium Swallow completed.  Full report located under Chart Review in the Imaging Section.  Brief recommendations include the following:  Clinical Impression  Pt presents with a functional oropharyngeal swallow. His swallow triggers primarily at the valleculae with solids and at times in the pyriform sinuses with thin liquids. He had trace penetration x1 during large, consecutive straws sips that cleared spontaneously upon completion of the swallow. He did exhibit the same, intermittent coughing that was noted clinically, but it was never associated with decreased airway protection and pt did not aspirate on this study. Will start regular solids and thin liquids. Pt was encouraged to use general aspiration precautions. Will f/u briefly for tolerance in light of intubation and h/o dysphagia/TBI.   Swallow Evaluation Recommendations       SLP Diet Recommendations: Regular solids;Thin liquid   Liquid Administration via: Cup;Straw   Medication Administration: Whole meds with liquid   Supervision: Patient able to self feed(supervision during first meal to watch for any impulsivity)   Compensations: Slow rate;Small sips/bites   Postural Changes: Seated upright at 90 degrees   Oral Care Recommendations: Oral care BID         Osie Bond., M.A. [facility] Pager [phone] Office [phone]  08/19/2019,11:36 AM

## 2019-08-19 NOTE — Progress Notes (Signed)
NAME:  Clinton [name], MRN:  [id number], DOB:  29-Mar-1996, LOS: 93 ADMISSION DATE:  08/13/2019, CONSULTATION DATE:  08/19/19 REFERRING MD:  Long, CHIEF COMPLAINT:  seizure   Brief History   24 y.o. M with PMH of TBI and seizure disorder admitted with status epilepticus in setting of taking half of prescribed dose Keppra.  Intubated.   Past Medical History  ADHD, Anxiety, Bipolar, TBI, HA, Seizures  Significant Hospital Events   1/3-presented to [facility] ED, transferred to [place]:  Neurology  Procedures:  1/3 lumbar puncture  Significant Diagnostic Tests:  1/3 CT head>>Stable right-sided postoperative encephalomalacia and other changes is noted. No acute intracranial abnormality seen. 08/19/2019 Functional Swallow Eval swallow triggers primarily at the valleculae with solids and at times in the pyriform sinuses with thin liquids. He had trace penetration x1 during large, consecutive straws sips that cleared spontaneously upon completion of the swallow. He did exhibit the same, intermittent coughing that was noted clinically, but it was never associated with decreased airway protection and pt did not aspirate on this study. Regular solids and thin liquids ordered  Micro Data:  1/3 CSF culture>>negative 1/3 HSV culture>> negative 1/3 COVID-19 and influenza>> negative 1/4 sputum >> rare staph aureus  Antimicrobials:  Vancomycin 1/3 only Ceftriaxone 1/3 only  Interim history/subjective:  Off precedex and very calm.   Slightly tachycardic Net positive 600 cc's  Objective   Blood pressure 119/67, pulse 84, temperature 98.1 F (36.7 C), temperature source Oral, resp. rate (!) 24, height 5\' 7"  (1.702 m), weight 71.3 kg, SpO2 97 %.        Intake/Output Summary (Last 24 hours) at 08/19/2019 1351 Last data filed at 08/19/2019 0200 Gross per 24 hour  Intake 547.61 ml  Output 775 ml  Net -227.39 ml   Filed Weights   08/17/19 0400 08/18/19 0455 08/19/19 0500  Weight:  73 kg 70.7 kg 71.3 kg   Physical exam: General: Adult male, supine in bed, awake and alert and in no distress HEENT: [place]/AT, MM pink/moist, PERRL, poor dentation Neuro: Awake and alert, following commands, MAE x 4, A&O x 3 ( needed re-orientation to day of the week)  CV: Sinus tach per tele,  s1s2 regular rate and rhythm, no murmur, rubs, or gallops,  PULM: Bilateral chest excursion, Clear to ascultation bilaterally, no added breath sounds , good volumes GI: soft, bowel sounds active in all 4 quadrants, non-tender, non-distended Extremities: warm/dry, no edema, brisk cap refill  Skin: no rashes or lesions, warm dry and intact   Resolved Hospital Problem list   Acute hypoxic respiratory failure, Acute pancreatitis ruled out, Prerenal azotemia from hypovolemia  Assessment & Plan:   Status epilepticus with hx of seizures. - likely from reducing keppra and ETOH use Plan: Neurology following, appreciate assistance  Continue IV Keppra and Valporic acid  Seizure precautions Frequent neuro assessments Will need to resume Clonazepam once able to take oral medications  Counseled regarding compliance with Keppra dosing and avoidance of ETOH  Agitated delirium with hx of ETOH. Hx of ADHD, Bipolar, Anxiety, TBI. Plan: Precedex off  PRN haldol  CIWA  Supplemental Thiamine, Folic acid, and multivitamin  Scheduled ativan   Tachycardia ? dry Plan Will give 1 L LR at 125/hr Pt has home med of inderal listed, but states he does not take this medication, and has not for years.  Unsure of reason for Inderal, but will hydrate with 1 L LR first. If he  remains tachy can  consider resuming Inderal 1/10.   HCAP with Staph aureus in sputum. -Sputum culture 01/04 with rare staph aures, susceptibilities pending  Plan: Continue IV Ancef  Follow cultures  Encourage pulmonary hygiene   Dysphagia. Plan: Swallow eval done 08/19/2019 Mild aspiration risk Pt was encouraged to use general aspiration  precautions. Will f/u briefly for tolerance in light of intubation and h/o dysphagia/TBI.  Tolerating po's well at present  Had discussion with patient about taking his whole dose of Keppra daily and avoidubg alcohol.Marland Kitchen  He verbalized understanding   Best practice:  Diet:Regular solids and thin liquids DVT prophylaxis: Heparin and SCDs GI prophylaxis: not indicated Mobility: Bedrest Code Status: Full code Disposition: ICU  Labs    CMP Latest Ref Rng & Units 08/19/2019 08/18/2019 08/17/2019  Glucose 70 - 99 mg/dL 93 112(H) 99  BUN 6 - 20 mg/dL 9 7 10   Creatinine 0.61 - 1.24 mg/dL 0.95 0.72 0.79  Sodium 135 - 145 mmol/L 141 140 142  Potassium 3.5 - 5.1 mmol/L 3.5 3.5 3.2(L)  Chloride 98 - 111 mmol/L 105 107 107  CO2 22 - 32 mmol/L 26 21(L) 20(L)  Calcium 8.9 - 10.3 mg/dL 9.4 9.2 9.1  Total Protein 6.5 - 8.1 g/dL - - -  Total Bilirubin 0.3 - 1.2 mg/dL - - -  Alkaline Phos 38 - 126 U/L - - -  AST 15 - 41 U/L - - -  ALT 0 - 44 U/L - - -    CBC Latest Ref Rng & Units 08/19/2019 08/18/2019 08/17/2019  WBC 4.0 - 10.5 K/uL 7.2 7.4 7.7  Hemoglobin 13.0 - 17.0 g/dL 15.4 14.7 13.7  Hematocrit 39.0 - 52.0 % 43.5 42.5 40.2  Platelets 150 - 400 K/uL 201 206 182    ABG    Component Value Date/Time   PHART 7.404 08/16/2019 0432   PCO2ART 35.5 08/16/2019 0432   PO2ART 102.0 08/16/2019 0432   HCO3 22.3 08/16/2019 0432   TCO2 23 08/16/2019 0432   ACIDBASEDEF 2.0 08/16/2019 0432   O2SAT 98.0 08/16/2019 0432    CBG (last 3)  Recent Labs    08/17/19 0345 08/17/19 1205 08/17/19 1517  GLUCAP 85 98 94    CRITICAL CARE Performed by: Magdalen Spatz   Critical Care Time 45 minutes   Magdalen Spatz, MSN, AGACNP-BC [place] Pager # [phone] After 4 pm please call [phone] 08/19/2019, 1:51 PM

## 2019-08-19 NOTE — Progress Notes (Signed)
  Speech Language Pathology Treatment: Dysphagia  Patient Details Name: Clinton [name] MRN: [id number] DOB: 1996-03-01 Today's Date: 08/19/2019 Time: [id number] SLP Time Calculation (min) (ACUTE ONLY): 12 min  Assessment / Plan / Recommendation Clinical Impression  Pt is alert, cooperative, calm. He is eager for food this morning and is well-known to this SLP from prior hospitalizations, at which time he had a significant dysphagia. Pt's secretion management appears to be significantly improved from evaluation on previous date. He believes his voice is at a "3 out of 5" and his speech is dysarthric (likely his baseline as he was previously dysarthric). With Min cues for pacing, pt consumed thin liquids and purees with occasional coughing. This is also present at baseline and therefore difficult to differentiate baseline versus decreased airway protection. He does however demonstrate consistent signs of dysphagia including multiple, audible swallows. I question if his current presentation is similar to his baseline, but given that he is still dysphonic and he has coughing associated with POs, I think it is also quite possible that he has an acute exacerbation. Recommend proceeding with MBS as I do think he may be ready to start some type of diet. Will complete as soon as can be scheduled with radiology.    HPI HPI: 24 y.o. male with PMH of TBI and seizure disorder admitted with status epilepticus in setting of taking half of prescribed dose of Keppra. Notes from ED state his mother died in 2023/08/03 and that pt has been drinking more heavily since her passing.  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 Intubated in ED 1/3-1/7. CXR 1/7 with lungs clear      SLP Plan  MBS       Recommendations  Diet recommendations: NPO;Other(comment)(ice chips after oral care) Medication Administration: Via alternative means                Oral Care Recommendations: Oral care QID;Oral care prior to ice chip/H20 Follow up Recommendations: (tba) SLP Visit Diagnosis: Dysphagia, unspecified (R13.10) Plan: MBS       GO                 Osie Bond., M.A. [facility] Pager [phone] Office [phone]  08/19/2019, 8:35 AM

## 2019-08-20 DIAGNOSIS — R000 Tachycardia, unspecified: Secondary | ICD-10-CM

## 2019-08-20 LAB — CBC
HCT: 42.8 % (ref 39.0–52.0)
Hemoglobin: 14.7 g/dL (ref 13.0–17.0)
MCH: 30.6 pg (ref 26.0–34.0)
MCHC: 34.3 g/dL (ref 30.0–36.0)
MCV: 89.2 fL (ref 80.0–100.0)
Platelets: 252 10*3/uL (ref 150–400)
RBC: 4.8 MIL/uL (ref 4.22–5.81)
RDW: 12.6 % (ref 11.5–15.5)
WBC: 7.4 10*3/uL (ref 4.0–10.5)
nRBC: 0 % (ref 0.0–0.2)

## 2019-08-20 LAB — BASIC METABOLIC PANEL
Anion gap: 9 (ref 5–15)
BUN: 7 mg/dL (ref 6–20)
CO2: 27 mmol/L (ref 22–32)
Calcium: 9.5 mg/dL (ref 8.9–10.3)
Chloride: 103 mmol/L (ref 98–111)
Creatinine, Ser: 0.86 mg/dL (ref 0.61–1.24)
GFR calc Af Amer: >60 mL/min (ref >60)
GFR calc non Af Amer: >60 mL/min (ref >60)
Glucose, Bld: 89 mg/dL (ref 70–99)
Potassium: 3.5 mmol/L (ref 3.5–5.1)
Sodium: 139 mmol/L (ref 135–145)

## 2019-08-20 MED ORDER — PANTOPRAZOLE SODIUM 40 MG PO TBEC
40.0000 mg | DELAYED_RELEASE_TABLET | Freq: Every day | ORAL | Status: DC
  Administered 2019-08-20 – 2019-08-21 (×2): 40 mg via ORAL
  Filled 2019-08-20 (×2): qty 1

## 2019-08-20 MED ORDER — DOCUSATE SODIUM 100 MG PO CAPS
100.0000 mg | ORAL_CAPSULE | Freq: Two times a day (BID) | ORAL | Status: DC
  Administered 2019-08-21: 10:00:00 100 mg via ORAL
  Filled 2019-08-20 (×2): qty 1

## 2019-08-20 MED ORDER — THIAMINE HCL 100 MG PO TABS
100.0000 mg | ORAL_TABLET | Freq: Every day | ORAL | Status: DC
  Administered 2019-08-20 – 2019-08-21 (×2): 100 mg via ORAL
  Filled 2019-08-20 (×2): qty 1

## 2019-08-20 MED ORDER — FOLIC ACID 1 MG PO TABS
1.0000 mg | ORAL_TABLET | Freq: Every day | ORAL | Status: DC
  Administered 2019-08-20 – 2019-08-21 (×2): 1 mg via ORAL
  Filled 2019-08-20 (×2): qty 1

## 2019-08-20 MED ORDER — PROPRANOLOL HCL 20 MG PO TABS
20.0000 mg | ORAL_TABLET | Freq: Two times a day (BID) | ORAL | Status: DC
  Administered 2019-08-20 – 2019-08-21 (×3): 20 mg via ORAL
  Filled 2019-08-20 (×4): qty 1
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
## 2019-08-21 LAB — GLUCOSE, CAPILLARY
Glucose-Capillary: 88 mg/dL (ref 70–99)
Glucose-Capillary: 93 mg/dL (ref 70–99)

## 2019-08-21 MED ORDER — PROPRANOLOL HCL 20 MG PO TABS: 20.0000 mg | ORAL_TABLET | Freq: Two times a day (BID) | ORAL | 2 refills | Status: DC

## 2019-08-21 MED ORDER — LEVETIRACETAM 500 MG PO TABS: 500.0000 mg | ORAL_TABLET | Freq: Two times a day (BID) | ORAL | 2 refills | Status: DC

## 2019-08-21 MED ORDER — DIVALPROEX SODIUM 250 MG PO DR TAB: DELAYED_RELEASE_TABLET | ORAL | 2 refills | Status: AC
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
## 2019-08-21 NOTE — Progress Notes (Signed)
Pt given D/C education all questions answered. IV removed. Printed prescriptions in D/C packet.

## 2019-08-21 NOTE — Progress Notes (Signed)
Physical Therapy Treatment Patient Details Name: Clinton [name] MRN: [id number] DOB: Feb 23, 1996 Today's Date: 08/21/2019    History of Present Illness 24 y.o. male with PMH of TBI and seizure disorder admitted with status epilepticus in setting of taking half of prescribed dose of Keppra. Notes from ED state his mother died in 08-05-23 and that pt has been drinking more heavily since her passing.  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 Intubated in ED 1/3-1/7. CXR 1/7 with lungs clear     PT Comments    Patient alert and cooperative throughout session. Mobility much improved with all goals met except for stair goal (anticipate he can meet goal level, but not yet attempted). Patient reports he is a little weak from being in bed, but otherwise moving as well as he normally does. He did have 2 stagger-steps with independent recovery during session. Again, he reports this is baseline. Do not feel follow-up PT indicated at this time.     Follow Up Recommendations  No PT follow up;Supervision - Intermittent(as PTA)     Equipment Recommendations  None recommended by PT    Recommendations for Other Services       Precautions / Restrictions Precautions Precautions: Fall Restrictions Weight Bearing Restrictions: No    Mobility  Bed Mobility Overal bed mobility: Independent                Transfers Overall transfer level: Needs assistance Equipment used: None Transfers: Sit to/from Stand Sit to Stand: Supervision         General transfer comment: x2; no LOB; supervison due to recent impulsivity  Ambulation/Gait Ambulation/Gait assistance: Min guard Gait Distance (Feet): 150 Feet Assistive device: None Gait Pattern/deviations: Drifts right/left;Staggering left;Step-through pattern     General Gait Details: ambulated around unit with one stagger/side step to his left when turning to  his right; pt recovered without assist; one more stagger step in the bathroom (uneven floor, sloped to shower drain) again recovered balance independently   Stairs             Wheelchair Mobility    Modified Rankin (Stroke Patients Only)       Balance Overall balance assessment: Needs assistance Sitting-balance support: No upper extremity supported;Feet unsupported Sitting balance-Leahy Scale: Normal     Standing balance support: During functional activity;Single extremity supported;Bilateral upper extremity supported Standing balance-Leahy Scale: Good                              Cognition Arousal/Alertness: Awake/alert Behavior During Therapy: Restless Overall Cognitive Status: No family/caregiver present to determine baseline cognitive functioning                                 General Comments: able to sequence upper body bathing and dressing (needed to don shirt prior to walking outside room); less impulsive and following instructions briskly      Exercises      General Comments General comments (skin integrity, edema, etc.): patient able to bathe upper body and don pullover shirt in standing; eager to go home; states he has imbalance at baseline and denies falls      Pertinent Vitals/Pain Pain Assessment: No/denies pain Faces Pain Scale: No hurt    Home Living  Prior Function            PT Goals (current goals can now be found in the care plan section) Acute Rehab PT Goals Patient Stated Goal: go home Time For Goal Achievement: 09/01/19 Potential to Achieve Goals: Good Progress towards PT goals: Progressing toward goals    Frequency    Min 3X/week      PT Plan Discharge plan needs to be updated    Co-evaluation              AM-PAC PT "6 Clicks" Mobility   Outcome Measure  Help needed turning from your back to your side while in a flat bed without using bedrails?: None Help  needed moving from lying on your back to sitting on the side of a flat bed without using bedrails?: None Help needed moving to and from a bed to a chair (including a wheelchair)?: None Help needed standing up from a chair using your arms (e.g., wheelchair or bedside chair)?: None Help needed to walk in hospital room?: A Little Help needed climbing 3-5 steps with a railing? : A Little 6 Click Score: 22    End of Session   Activity Tolerance: Patient tolerated treatment well Patient left: in bed;with call bell/phone within reach;with bed alarm set Nurse Communication: Mobility status;Other (comment)(ok to discharge from PT standpoint) PT Visit Diagnosis: Unsteadiness on feet (R26.81);Other abnormalities of gait and mobility (R26.89)     Time: 0484-9865 PT Time Calculation (min) (ACUTE ONLY): 15 min  Charges:  $Gait Training: 8-22 mins                      Arby Barrette, PT Pager [phone]    Rexanne Mano 08/21/2019, 11:35 AM

## 2019-08-21 NOTE — Progress Notes (Signed)
  Speech Language Pathology Treatment: Dysphagia  Patient Details Name: Clinton [name] MRN: [id number] DOB: [birth year] Today's Date: 08/21/2019 Time: 1050-1059 SLP Time Calculation (min) (ACUTE ONLY): 9 min  Assessment / Plan / Recommendation Clinical Impression  Session was kept brief as pt is frustrated this morning, eager to go home as he has been told he will discharge today but he is not sure when this will happen. He declined food but accepted sips of water via straw. He coughed once after drinking and says that he coughs occasionally with POs but also occasionally throughout the day at other times. He believes he is swallowing well and took large sips off a straw to demonstrate this without overt s/s of aspiration following. 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 It appears as though pt may discharge today as well. SLP to sign off acutely.    HPI HPI: 24 y.o. male with PMH of TBI and seizure disorder admitted with status epilepticus in setting of taking half of prescribed dose of Keppra. Notes from ED state his mother died in Jun 18, 2023 and that pt has been drinking more heavily since her passing.  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 Intubated in ED 1/3-1/7. CXR 1/7 with lungs clear      SLP Plan  All goals met       Recommendations  Diet recommendations: Regular;Thin liquid Liquids provided via: Cup;Straw Medication Administration: Whole meds with liquid Supervision: Patient able to self feed;Intermittent supervision to cue for compensatory strategies Compensations: Slow rate;Small sips/bites Postural Changes and/or Swallow Maneuvers: Seated upright 90 degrees                Oral Care Recommendations: Oral care BID Follow up Recommendations: None SLP Visit  Diagnosis: Dysphagia, unspecified (R13.10) Plan: Continue with current plan of care       GO                 Osie Bond., M.A. [facility] Pager [phone] Office [phone]  08/21/2019, 11:04 AM

## 2019-08-21 NOTE — Discharge Summary (Signed)
Physician Discharge Summary  ESCO OSHMAN [id number] DOB: 06/10/1996 DOA: 08/13/2019  PCP: [facility]  Admit date: 08/13/2019 Discharge date: 08/21/2019  Time spent: 25 minutes  Recommendations for Outpatient Follow-up:  1. All meds Keppra, Depakote, propranolol ordered for discharge and sent into pharmacy 2. Given name of neurologist to have follow-up with  Discharge Diagnoses:  Active Problems:   Status epilepticus ([place])   Alcohol-induced acute pancreatitis   Endotracheal tube present   History of ETT   Discharge Condition: Improved  Diet recommendation: Regular  Filed Weights   08/19/19 0500 08/20/19 0500 08/21/19 0500  Weight: 71.3 kg 70.2 kg 69.5 kg    History of present illness:  24 year old white male prior TBI seizure disorder admitted with status epilepticus 08/13/2019 intubated in the ED He was placed on propofol gtt. and it looks like he had may be pancreatitis on admission He also had AKI   Hospital Course:   Status epilepticus neurology saw the patient and noted that he had been seizure-free for 5 days He tells me he has been rationing his medications I spoke to hi stepfather who asked for some guidance with regards follow-up neuro I placed him back on his prior to admission meds of Keppra Depakote and propanolol he will follow up in the outpatient setting  AKI on admission-volume depleted 215/1.2 on discharge this was stabilized and labs showed BUN/creatinine 7/0.8  Pancreatitis admission? This is in setting of alcohol use-he also had some mild elevation of CK He will need follow-up in the outpatient     Discharge Exam: Vitals:   08/21/19 1135 08/21/19 1138  BP: (!) 130/97   Pulse:  80  Resp:  20  Temp:  98.4 F (36.9 C)  SpO2:  97%    General: Awake alert coherent slightly slow speech no distress moves all 4 limbs equally without deficit EOMI NCAT Power 5/5 no focal deficit Gait is normal Chest clear S1-S2 no murmur rub or  gallop Reflexes are intact without hyperreflexia  Discharge Instructions   Discharge Instructions    Diet - low sodium heart healthy   Complete by: As directed    Discharge instructions   Complete by: As directed    Ensure that you do not miss any of your seizure medications and do not drink alcohol Make sure that you get follow-up in the outpatient setting with neurologist We will set you up if needed with someone who can help you with neurology follow-up and they may be able to assist you with getting a neurologist see you   Increase activity slowly   Complete by: As directed      Allergies as of 08/21/2019      Reactions   Seroquel [quetiapine Fumarate] Other (See Comments)   Suicidal on this med in past - mother wants to make sure this medication is never given again   Morphine And Related Hives, Itching   Pt had reaction to 2mg  morphine given through his PIV. About one minute after injection, the patient's arm began to redden with hives appearing from wrist to mid upper arm. Mostly anterior. Pt also began itching at forearm.       Medication List    TAKE these medications   divalproex 250 MG DR tablet Commonly known as: Depakote Take 2 tabs by mouth AM and 3 tabs PM   levETIRAcetam 500 MG tablet Commonly known as: Keppra Take 1 tablet (500 mg total) by mouth 2 (two) times daily. What changed:  how much to take  when to take this   loperamide 2 MG capsule Commonly known as: IMODIUM Take 1 capsule (2 mg total) by mouth 4 (four) times daily as needed for diarrhea or loose stools.   propranolol 20 MG tablet Commonly known as: INDERAL Take 1 tablet (20 mg total) by mouth 2 (two) times daily.      Allergies  Allergen Reactions  . Seroquel [Quetiapine Fumarate] Other (See Comments)    Suicidal on this med in past - mother wants to make sure this medication is never given again  . Morphine And Related Hives and Itching    Pt had reaction to 2mg  morphine given  through his PIV. About one minute after injection, the patient's arm began to redden with hives appearing from wrist to mid upper arm. Mostly anterior. Pt also began itching at forearm.    Follow-up Information    Phillips Odor, MD. Schedule an appointment as soon as possible for a visit in 2 week(s).   Specialty: Neurology Contact information: [address] [place] [address] [phone]            The results of significant diagnostics from this hospitalization (including imaging, microbiology, ancillary and laboratory) are listed below for reference.    Significant Diagnostic Studies: CT Head Wo Contrast  Result Date: 08/13/2019 CLINICAL DATA:  Seizure. EXAM: CT HEAD WITHOUT CONTRAST TECHNIQUE: Contiguous axial images were obtained from the base of the skull through the vertex without intravenous contrast. COMPARISON:  July 03, 2015. FINDINGS: Brain: Right frontal temporal and parietal encephalomalacia is noted most consistent with postoperative change. No mass effect or midline shift is noted. There is no evidence of hemorrhage, acute infarction or mass lesion. Ventricular size is unremarkable. Vascular: No hyperdense vessel or unexpected calcification. Skull: Status post right frontal and parietal craniotomy. No acute osseous abnormality is noted. Sinuses/Orbits: No acute finding. Other: None. IMPRESSION: Stable right-sided postoperative encephalomalacia and other changes is noted. No acute intracranial abnormality seen. Electronically Signed   By: Marijo Conception M.D.   On: 08/13/2019 12:32   DG Chest Port 1 View  Result Date: 08/17/2019 CLINICAL DATA:  Seizures EXAM: PORTABLE CHEST 1 VIEW COMPARISON:  Radiograph 08/16/2019 FINDINGS: Interval removal of the patient's endotracheal and transesophageal tubes. Cardiac monitoring leads overlie the chest. Stable plate and screw fixation of the mid right clavicle. No consolidation, features of edema, pneumothorax, or effusion.  The cardiomediastinal contours are unremarkable. No acute osseous or soft tissue abnormality. IMPRESSION: Removal of the endotracheal and transesophageal tubes. Lungs are otherwise clear. Electronically Signed   By: Lovena Le M.D.   On: 08/17/2019 06:45   DG CHEST PORT 1 VIEW  Result Date: 08/16/2019 CLINICAL DATA:  ETT placement EXAM: PORTABLE CHEST 1 VIEW COMPARISON:  08/16/2019, 5:28 a.m. FINDINGS: Slight interval retraction of endotracheal tube, now in satisfactory position over the mid to lower trachea, approximately 3.5 cm above the carina. Esophagogastric tube with tip and side port below the diaphragm. Mild cardiomegaly. No acute abnormality of the lungs. IMPRESSION: Slight interval retraction of endotracheal tube, now in satisfactory position over the mid to lower trachea, approximately 3.5 cm above the carina. Electronically Signed   By: Eddie Candle M.D.   On: 08/16/2019 08:47   DG Chest Port 1 View  Result Date: 08/16/2019 CLINICAL DATA:  ETT, seizure EXAM: PORTABLE CHEST 1 VIEW COMPARISON:  Radiograph 08/15/2019 FINDINGS: *Persistently low positioning of the endotracheal tube, less than 1 cm from the carina. *Transesophageal tube  tip and side port are beyond the GE junction. *Additional cardiac monitoring leads overlie the chest. Persistent streaky opacities in the left lung with a more bandlike configuration on today's exam favoring subsegmental atelectatic change. No new consolidative process. No pneumothorax or effusion. Cardiomediastinal contours are stable. Remote plate and screw fixation of the mid right clavicle. No acute osseous or soft tissue abnormality. IMPRESSION: 1. Persistently low positioning of the endotracheal tube, less than 1 cm from the carina. Consider retraction 3 cm to the mid trachea. 2. Additional support devices, as above. 3. Bandlike opacity in the left mid lung most compatible with atelectasis. These results will be called to the ordering clinician or representative  by the Radiologist Assistant, and communication documented in the PACS or zVision Dashboard. Electronically Signed   By: Lovena Le M.D.   On: 08/16/2019 06:17   DG Chest Port 1 View  Result Date: 08/15/2019 CLINICAL DATA:  ETT, seizures EXAM: PORTABLE CHEST 1 VIEW COMPARISON:  Radiograph 08/14/2019 FINDINGS: Transesophageal tube tip and side port distal to the GE junction. Endotracheal tube in the mid trachea, 3 cm from the carina. Additional support devices overlie the chest. Persistent left peribronchial opacities as well as more patchy opacity in the left mid lung. Remaining portions of the lungs are clear. No pneumothorax or effusion. Normal cardiac size and cardiomediastinal contours. Prior right clavicular ORIF. No acute osseous or soft tissue abnormality. IMPRESSION: 1. Persistent left peribronchial opacities and more patchy opacity in the left mid lung. Could feasibly reflect atelectasis, sequela of aspiration or infectious consolidation. 2. Endotracheal tube tip 3 cm from the carina. 3. Transesophageal tube tip and side port distal to the GE junction. Electronically Signed   By: Lovena Le M.D.   On: 08/15/2019 06:02   DG CHEST PORT 1 VIEW  Result Date: 08/14/2019 CLINICAL DATA:  24 year old male with fever. Seizure. Negative for COVID-19. Yesterday. EXAM: PORTABLE CHEST 1 VIEW COMPARISON:  08/13/2019 portable chest radiographs and earlier. FINDINGS: Portable AP semi upright view at 1028 hours. Intubated. Endotracheal tube tip between the clavicles and carina, about 19 millimeters above the carina. Enteric tube courses to the left upper quadrant with side hole at the level of the proximal gastric body. Larger left lung volume with streaky and confluent lower lobe peribronchial opacity. Elsewhere allowing for portable technique the lungs appear clear. No pneumothorax or pleural effusion. Normal cardiac size and mediastinal contours. Previous right clavicle ORIF. Paucity of bowel gas in the upper  abdomen. IMPRESSION: 1. Satisfactory ET tube and enteric tube. 2. Left lower lobe peribronchial opacity suspicious for bronchopneumonia, or perhaps aspiration in this setting. Electronically Signed   By: Genevie Ann M.D.   On: 08/14/2019 10:51   DG Chest Portable 1 View  Result Date: 08/13/2019 CLINICAL DATA:  Status post intubation. EXAM: PORTABLE CHEST 1 VIEW COMPARISON:  October 04, 2013. FINDINGS: Endotracheal and nasogastric tubes are in grossly good position. Stable cardiomediastinal silhouette. Lungs are clear. No pneumothorax or pleural effusion is noted. Bony thorax is unremarkable. Exam is limited as lung apices are not included in field-of-view. IMPRESSION: Endotracheal and nasogastric tubes are in grossly good position. No acute cardiopulmonary abnormality seen. Exam is limited as lung apices are not included in field-of-view. Electronically Signed   By: Marijo Conception M.D.   On: 08/13/2019 12:23   DG Swallowing Func-Speech Pathology  Result Date: 08/19/2019 Objective Swallowing Evaluation: Type of Study: MBS-Modified Barium Swallow Study  Patient Details Name: LLEYTON BAISA MRN: [id number] Date of  Birth: 07/19/1996 Today's Date: 08/19/2019 Time: SLP Start Time (ACUTE ONLY): 0935 -SLP Stop Time (ACUTE ONLY): 0950 SLP Time Calculation (min) (ACUTE ONLY): 15 min Past Medical History: Past Medical History: Diagnosis Date . ADHD (attention deficit hyperactivity disorder)  . Anxiety  . Bipolar disorder ([place])  . 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 Headache(784.0)  . Memory loss, short term  . Seizures ([place])  Past Surgical History: Past Surgical History: Procedure Laterality Date . CRANIOPLASTY Right 01/19/2014  Procedure: Right Cranioplasty with replacement of bone flap from abdomen;  Surgeon: Erline Levine, MD;  Location: [place];  Service: Neurosurgery;  Laterality: Right;  Right Cranioplasty with replacement of bone flap from abdomen . CRANIOTOMY  09/22/2013  Procedure:  Decompressive Craniectomy with ICP monitor placement and bone flap placement into abdomen;  Surgeon: Erline Levine, MD;  Location: [facility];  Service: Neurosurgery;;  Decompressive Craniectomy with ICP monitor placement and bone flap placement into abdomen . ESOPHAGOGASTRODUODENOSCOPY N/A 10/02/2013  Procedure: ESOPHAGOGASTRODUODENOSCOPY (EGD);  Surgeon: Gwenyth Ober, MD;  [facility] ENDOSCOPY;  Service: General;  Laterality: N/A; . ORIF CLAVICULAR FRACTURE Right 07/09/2015  Procedure: OPEN REDUCTION INTERNAL FIXATION (ORIF) CLAVICULAR FRACTURE;  Surgeon: Renette Butters, MD;  Location: [facility];  Service: Orthopedics;  Laterality: Right; . PEG PLACEMENT N/A 10/02/2013  Procedure: PERCUTANEOUS ENDOSCOPIC GASTROSTOMY (PEG) PLACEMENT;  Surgeon: Gwenyth Ober, MD;  Location: [facility];  Service: General;  Laterality: N/A; . PERCUTANEOUS TRACHEOSTOMY N/A 10/02/2013  Procedure: [place];  Surgeon: Gwenyth Ober, MD;  Location: [facility];  Service: General;  Laterality: N/A; HPI: 24 y.o. male with PMH of TBI and seizure disorder admitted with status epilepticus in setting of taking half of prescribed dose of Keppra. Notes from ED state his mother died in 07-21-2023 and that pt has been drinking more heavily since her passing.  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 Intubated in ED 1/3-1/7. CXR 1/7 with lungs clear  Subjective: Pt alert, cooperative Assessment / Plan / Recommendation CHL IP CLINICAL IMPRESSIONS 08/19/2019 Clinical Impression Pt presents with a functional oropharyngeal swallow. His swallow triggers primarily at the valleculae with solids and at times in the pyriform sinuses with thin liquids. He had trace penetration x1 during large, consecutive straws sips that cleared spontaneously upon completion of the swallow. He did exhibit the same, intermittent coughing that was noted clinically, but it  was never associated with decreased airway protection and pt did not aspirate on this study. Will start regular solids and thin liquids. Pt was encouraged to use general aspiration precautions. Will f/u briefly for tolerance in light of intubation and h/o dysphagia/TBI. SLP Visit Diagnosis Dysphagia, unspecified (R13.10) Attention and concentration deficit following -- Frontal lobe and executive function deficit following -- Impact on safety and function Mild aspiration risk   CHL IP TREATMENT RECOMMENDATION 08/19/2019 Treatment Recommendations Therapy as outlined in treatment plan below Some encounter information is confidential and restricted. Go to Review Flowsheets activity to see all data.   Prognosis 08/19/2019 Prognosis for Safe Diet Advancement Good Barriers to Reach Goals -- Barriers/Prognosis Comment -- Some encounter information is confidential and restricted. Go to Review Flowsheets activity to see all data. CHL IP DIET RECOMMENDATION 08/19/2019 SLP Diet Recommendations Regular solids;Thin liquid Liquid Administration via Cup;Straw Medication Administration Whole meds with liquid Compensations Slow rate;Small sips/bites Postural Changes Seated upright at 90 degrees Some encounter information is confidential  and restricted. Go to Review Flowsheets activity to see all data.   CHL IP OTHER RECOMMENDATIONS 08/19/2019 Recommended Consults -- Oral Care Recommendations Oral care BID Other Recommendations -- Some encounter information is confidential and restricted. Go to Review Flowsheets activity to see all data.   CHL IP FOLLOW UP RECOMMENDATIONS 08/19/2019 Follow up Recommendations None Some encounter information is confidential and restricted. Go to Review Flowsheets activity to see all data.   CHL IP FREQUENCY AND DURATION 08/19/2019 Speech Therapy Frequency (ACUTE ONLY) min 1 x/week Treatment Duration 1 week Some encounter information is confidential and restricted. Go to Review Flowsheets activity to see all data.       CHL IP ORAL PHASE 08/19/2019 Oral Phase WFL Oral - Pudding Teaspoon -- Oral - Pudding Cup -- Oral - Honey Teaspoon -- Oral - Honey Cup -- Oral - Nectar Teaspoon -- Oral - Nectar Cup -- Oral - Nectar Straw -- Oral - Thin Teaspoon -- Oral - Thin Cup -- Oral - Thin Straw -- Oral - Puree -- Oral - Mech Soft -- Oral - Regular -- Oral - Multi-Consistency -- Oral - Pill -- Oral Phase - Comment -- Some encounter information is confidential and restricted. Go to Review Flowsheets activity to see all data.  CHL IP PHARYNGEAL PHASE 08/19/2019 Pharyngeal Phase WFL Pharyngeal- Pudding Teaspoon -- Pharyngeal -- Pharyngeal- Pudding Cup -- Pharyngeal -- Pharyngeal- Honey Teaspoon -- Pharyngeal -- Pharyngeal- Honey Cup -- Pharyngeal -- Pharyngeal- Nectar Teaspoon -- Pharyngeal -- Pharyngeal- Nectar Cup -- Pharyngeal -- Pharyngeal- Nectar Straw -- Pharyngeal -- Pharyngeal- Thin Teaspoon -- Pharyngeal -- Pharyngeal- Thin Cup -- Pharyngeal -- Pharyngeal- Thin Straw -- Pharyngeal -- Pharyngeal- Puree -- Pharyngeal -- Pharyngeal- Mechanical Soft -- Pharyngeal -- Pharyngeal- Regular -- Pharyngeal -- Pharyngeal- Multi-consistency -- Pharyngeal -- Pharyngeal- Pill -- Pharyngeal -- Pharyngeal Comment --  CHL IP CERVICAL ESOPHAGEAL PHASE 08/19/2019 Cervical Esophageal Phase WFL Pudding Teaspoon -- Pudding Cup -- Honey Teaspoon -- Honey Cup -- Nectar Teaspoon -- Nectar Cup -- Nectar Straw -- Thin Teaspoon -- Thin Cup -- Thin Straw -- Puree -- Mechanical Soft -- Regular -- Multi-consistency -- Pill -- Cervical Esophageal Comment -- Osie Bond., M.A. CCC-SLP Acute Rehabilitation Services Pager [phone] Office [phone] 08/19/2019, 11:36 AM              Overnight EEG with video  Result Date: 08/14/2019 Lora Havens, MD     08/16/2019  8:41 AM Patient Name: ROZELLE YUILL MRN: [id number] Epilepsy Attending: Lora Havens Referring Physician/Provider: Dr. Kerney Elbe Duration: 08/14/2019 0030 to 08/15/2019 0030 Patient history: 24 year old  male with past medical history of TBI, prior right craniotomy and cranioplasty and epilepsy who presented with status epilepticus.  EEG  for seizures. Level of alertness: Comatose/sedated AEDs during EEG study: Keppra, Depakote, propofol, Versed Technical aspects: This EEG study was done with scalp electrodes positioned according to the 10-20 International system of electrode placement. Electrical activity was acquired at a sampling rate of 500Hz  and reviewed with a high frequency filter of 70Hz  and a low frequency filter of 1Hz . EEG data were recorded continuously and digitally stored. Description: EEG showed an excessive amount of 15 to 18 Hz, 2-3 uV beta activity with irregular morphology distributed symmetrically and diffusely.  At times high amplitude, intermittent rhythmic generalized 2-3hz  delta activity with overriding 15 to 18 Hz beta activity was noted which would wax and wane without any clinical activity seen on video. Patient event button was pressed on 08/14/2019 at [place] and [id number]  during which patient was noted to have intermittent semirhythmic whole-body jerking, more prominent in the right shoulder.  Concomitant EEG showed generalized high amplitude 2 to 3 Hz delta slowing with overriding 13 to 15 Hz beta activity.  Some sharply contoured waves were seen in left temporoparietal region during the same time.  Even though no clear evolution was seen, due to abrupt and and end as well as a clinical jerking, this was most likely ictal. Event button was also placed on 08/14/2019 at 0817 for unclear reasons.  Concomitant EEG before during and after the event did not show any EEG change to suggest seizure. Hyperventilation and photic stimulation were not performed. ABNORMALITY - Seizure, generalized - Intermittent rhythmic delta slow, generalized - Excessive beta, generalized IMPRESSION: This study showed two seizures on 08/14/2019 at 0420 and 0433 with generalized onset during which patient was noted to have  whole-body semirhythmic jerking, more prominent in right shoulder.  EEG also showed intermittent rhythmic delta activity without any clinical symptoms which is on the ictal-interictal continuum.  Additionally, there is evidence of severe diffuse encephalopathy, nonspecific etiology but could be secondary to sedation. Lora Havens    Microbiology: Recent Results (from the past 240 hour(s))  Respiratory Panel by RT PCR (Flu A&B, Covid) - Nasopharyngeal Swab     Status: None   Collection Time: 08/13/19 11:45 AM   Specimen: Nasopharyngeal Swab  Result Value Ref Range Status   SARS Coronavirus 2 by RT PCR NEGATIVE NEGATIVE Final    Comment: (NOTE) SARS-CoV-2 target nucleic acids are NOT DETECTED. The SARS-CoV-2 RNA is generally detectable in upper respiratoy specimens during the acute phase of infection. The lowest concentration of SARS-CoV-2 viral copies this assay can detect is 131 copies/mL. A negative result does not preclude SARS-Cov-2 infection and should not be used as the sole basis for treatment or other patient management decisions. A negative result may occur with  improper specimen collection/handling, submission of specimen other than nasopharyngeal swab, presence of viral mutation(s) within the areas targeted by this assay, and inadequate number of viral copies (<131 copies/mL). A negative result must be combined with clinical observations, patient history, and epidemiological information. The expected result is Negative. Fact Sheet for Patients:  PinkCheek.be Fact Sheet for Healthcare Providers:  GravelBags.it This test is not yet ap proved or cleared by the [place] FDA and  has been authorized for detection and/or diagnosis of SARS-CoV-2 by FDA under an Emergency Use Authorization (EUA). This EUA will remain  in effect (meaning this test can be used) for the duration of the COVID-19 declaration under Section  564(b)(1) of the Act, 21 U.S.C. section 360bbb-3(b)(1), unless the authorization is terminated or revoked sooner.    Influenza A by PCR NEGATIVE NEGATIVE Final   Influenza B by PCR NEGATIVE NEGATIVE Final    Comment: (NOTE) The Xpert Xpress SARS-CoV-2/FLU/RSV assay is intended as an aid in  the diagnosis of influenza from Nasopharyngeal swab specimens and  should not be used as a sole basis for treatment. Nasal washings and  aspirates are unacceptable for Xpert Xpress SARS-CoV-2/FLU/RSV  testing. Fact Sheet for Patients: PinkCheek.be Fact Sheet for Healthcare Providers: GravelBags.it This test is not yet approved or cleared by the [place] FDA and  has been authorized for detection and/or diagnosis of SARS-CoV-2 by  FDA under an Emergency Use Authorization (EUA). This EUA will remain  in effect (meaning this test can be used) for the duration of the  Covid-19 declaration under Section 564(b)(1) of  the Act, 21  U.S.C. section 360bbb-3(b)(1), unless the authorization is  terminated or revoked. Performed at [facility], [address], [place], [place] [address]   CSF culture     Status: None   Collection Time: 08/13/19  2:43 PM   Specimen: Back; Cerebrospinal Fluid  Result Value Ref Range Status   Specimen Description   Final    BACK Performed at [facility], [address] [place], [place] [address]    Special Requests   Final    NONE Performed at [facility], [address] [place], [place] [address]    Gram Stain   Final    NO CELLS OR ORGANISMS SEEN Gram Stain Report Called to,Read Back By and Verified With: CREWS,M. AT [id number] ON 08/13/2019 BY EVA Performed at [facility], [address] [place], [place] [address]    Culture   Final    NO GROWTH 3 DAYS Performed at [facility], [place] [address] [place], [place] [address]    Report Status 08/16/2019 FINAL  Final  Hsv Culture And Typing     Status: None    Collection Time: 08/13/19  2:43 PM   Specimen: Back; Cerebrospinal Fluid  Result Value Ref Range Status   HSV Culture/Type Comment  Final    Comment: (NOTE) Negative No Herpes simplex virus isolated. Performed At: [facility] [place], [place] [id number] Rush Farmer MD [id number]    Source of Sample CSF  Final    Comment: Performed at [facility], [address], [place], [place] [address]  MRSA PCR Screening     Status: None   Collection Time: 08/13/19 10:10 PM   Specimen: Nasal Mucosa; Nasopharyngeal  Result Value Ref Range Status   MRSA by PCR NEGATIVE NEGATIVE Final    Comment:        The GeneXpert MRSA Assay (FDA approved for NASAL specimens only), is one component of a comprehensive MRSA colonization surveillance program. It is not intended to diagnose MRSA infection nor to guide or monitor treatment for MRSA infections. Performed at [facility], [place] [address], [place], [place] [address]   Culture, respiratory (non-expectorated)     Status: None   Collection Time: 08/14/19  9:30 AM   Specimen: Tracheal Aspirate; Respiratory  Result Value Ref Range Status   Specimen Description TRACHEAL ASPIRATE  Final   Special Requests NONE  Final   Gram Stain   Final    FEW WBC PRESENT, PREDOMINANTLY PMN FEW GRAM POSITIVE COCCI IN PAIRS IN CLUSTERS Performed at [facility], [address], [place], [place] [address]    Culture RARE STAPHYLOCOCCUS AUREUS  Final   Report Status 08/18/2019 FINAL  Final   Organism ID, Bacteria STAPHYLOCOCCUS AUREUS  Final      Susceptibility   Staphylococcus aureus - MIC*    CIPROFLOXACIN <=0.5 SENSITIVE Sensitive     ERYTHROMYCIN >=8 RESISTANT Resistant     GENTAMICIN <=0.5 SENSITIVE Sensitive     OXACILLIN <=0.25 SENSITIVE Sensitive     TETRACYCLINE <=1 SENSITIVE Sensitive     VANCOMYCIN <=0.5 SENSITIVE Sensitive     TRIMETH/SULFA <=10 SENSITIVE Sensitive     CLINDAMYCIN <=0.25 SENSITIVE  Sensitive     RIFAMPIN <=0.5 SENSITIVE Sensitive     Inducible Clindamycin NEGATIVE Sensitive     * RARE STAPHYLOCOCCUS AUREUS     Labs: Basic Metabolic Panel: Recent Labs  Lab 08/15/19 0309 08/15/19 1644 08/16/19 [id number] 08/16/19 1642 08/17/19 [id number] 08/18/19 [id number]  08/19/19 0557 08/20/19 0138  NA 144  --  145  --  142 140 141 139  K 3.5  --  3.8  --  3.2* 3.5 3.5 3.5  CL 109  --  113*  --  107 107 105 103  CO2 24  --  19*  --  20* 21* 26 27  GLUCOSE 73  --  98  --  99 112* 93 89  BUN 15  --  11  --  10 7 9 7   CREATININE 1.26*  --  1.00  --  0.79 0.72 0.95 0.86  CALCIUM 8.7*  --  9.4  --  9.1 9.2 9.4 9.5  MG 2.0 1.8 1.7 1.6* 1.7  --   --   --   PHOS 3.0 3.4 3.8 4.1 3.5  --   --   --    Liver Function Tests: No results for input(s): AST, ALT, ALKPHOS, BILITOT, PROT, ALBUMIN in the last 168 hours. No results for input(s): LIPASE, AMYLASE in the last 168 hours. No results for input(s): AMMONIA in the last 168 hours. CBC: Recent Labs  Lab 08/16/19 0508 08/17/19 0711 08/18/19 0625 08/19/19 0557 08/20/19 0138  WBC 10.0 7.7 7.4 7.2 7.4  HGB 13.7 13.7 14.7 15.4 14.7  HCT 39.8 40.2 42.5 43.5 42.8  MCV 89.4 89.5 88.4 88.1 89.2  PLT 190 182 206 201 252   Cardiac Enzymes: No results for input(s): CKTOTAL, CKMB, CKMBINDEX, TROPONINI in the last 168 hours. BNP: BNP (last 3 results) No results for input(s): BNP in the last 8760 hours.  ProBNP (last 3 results) No results for input(s): PROBNP in the last 8760 hours.  CBG: Recent Labs  Lab 08/16/19 1935 08/16/19 2323 08/17/19 0345 08/17/19 1205 08/17/19 1517  GLUCAP 81 82 85 98 94       Signed:  Nita Sells MD   [facility] 08/21/2019, 12:23 PM

## 2019-08-21 NOTE — Progress Notes (Signed)
Pt transferred to room [facility]. Around 1130. Report given to [place] by [employer]. Anticipate pt d/c later today.

## 2019-11-29 ENCOUNTER — Encounter (HOSPITAL_COMMUNITY): Payer: Self-pay

## 2019-11-29 ENCOUNTER — Inpatient Hospital Stay (HOSPITAL_COMMUNITY)
Admission: EM | Admit: 2019-11-29 | Discharge: 2020-01-01 | DRG: 003 | Disposition: A | Discharge status: Skilled Nursing Facility | Payer: Medicaid Other | Attending: General Surgery | Admitting: General Surgery

## 2019-11-29 ENCOUNTER — Emergency Department (HOSPITAL_COMMUNITY): Payer: Medicaid Other

## 2019-11-29 ENCOUNTER — Inpatient Hospital Stay (HOSPITAL_COMMUNITY): Payer: Medicaid Other

## 2019-11-29 DIAGNOSIS — S066X9A Traumatic subarachnoid hemorrhage with loss of consciousness of unspecified duration, initial encounter: Secondary | ICD-10-CM | POA: Diagnosis present

## 2019-11-29 DIAGNOSIS — K859 Acute pancreatitis without necrosis or infection, unspecified: Secondary | ICD-10-CM | POA: Diagnosis present

## 2019-11-29 DIAGNOSIS — R402352 Coma scale, best motor response, localizes pain, at arrival to emergency department: Secondary | ICD-10-CM | POA: Diagnosis present

## 2019-11-29 DIAGNOSIS — R402212 Coma scale, best verbal response, none, at arrival to emergency department: Secondary | ICD-10-CM | POA: Diagnosis present

## 2019-11-29 DIAGNOSIS — I629 Nontraumatic intracranial hemorrhage, unspecified: Secondary | ICD-10-CM | POA: Diagnosis not present

## 2019-11-29 DIAGNOSIS — T1490XA Injury, unspecified, initial encounter: Secondary | ICD-10-CM

## 2019-11-29 DIAGNOSIS — S82102A Unspecified fracture of upper end of left tibia, initial encounter for closed fracture: Secondary | ICD-10-CM | POA: Diagnosis present

## 2019-11-29 DIAGNOSIS — B957 Other staphylococcus as the cause of diseases classified elsewhere: Secondary | ICD-10-CM | POA: Diagnosis present

## 2019-11-29 DIAGNOSIS — S82832A Other fracture of upper and lower end of left fibula, initial encounter for closed fracture: Secondary | ICD-10-CM | POA: Diagnosis present

## 2019-11-29 DIAGNOSIS — I619 Nontraumatic intracerebral hemorrhage, unspecified: Secondary | ICD-10-CM | POA: Diagnosis present

## 2019-11-29 DIAGNOSIS — Z781 Physical restraint status: Secondary | ICD-10-CM

## 2019-11-29 DIAGNOSIS — G40909 Epilepsy, unspecified, not intractable, without status epilepticus: Secondary | ICD-10-CM | POA: Diagnosis present

## 2019-11-29 DIAGNOSIS — Z9911 Dependence on respirator [ventilator] status: Secondary | ICD-10-CM | POA: Diagnosis not present

## 2019-11-29 DIAGNOSIS — D62 Acute posthemorrhagic anemia: Secondary | ICD-10-CM | POA: Diagnosis not present

## 2019-11-29 DIAGNOSIS — S82262A Displaced segmental fracture of shaft of left tibia, initial encounter for closed fracture: Secondary | ICD-10-CM

## 2019-11-29 DIAGNOSIS — R402112 Coma scale, eyes open, never, at arrival to emergency department: Secondary | ICD-10-CM | POA: Diagnosis present

## 2019-11-29 DIAGNOSIS — I609 Nontraumatic subarachnoid hemorrhage, unspecified: Secondary | ICD-10-CM

## 2019-11-29 DIAGNOSIS — Z91040 Latex allergy status: Secondary | ICD-10-CM

## 2019-11-29 DIAGNOSIS — Z4659 Encounter for fitting and adjustment of other gastrointestinal appliance and device: Secondary | ICD-10-CM

## 2019-11-29 DIAGNOSIS — E8889 Other specified metabolic disorders: Secondary | ICD-10-CM | POA: Diagnosis present

## 2019-11-29 DIAGNOSIS — S0990XD Unspecified injury of head, subsequent encounter: Secondary | ICD-10-CM | POA: Diagnosis not present

## 2019-11-29 DIAGNOSIS — S72099A Other fracture of head and neck of unspecified femur, initial encounter for closed fracture: Secondary | ICD-10-CM | POA: Diagnosis not present

## 2019-11-29 DIAGNOSIS — R7881 Bacteremia: Secondary | ICD-10-CM | POA: Diagnosis present

## 2019-11-29 DIAGNOSIS — Z20822 Contact with and (suspected) exposure to covid-19: Secondary | ICD-10-CM | POA: Diagnosis present

## 2019-11-29 DIAGNOSIS — Z66 Do not resuscitate: Secondary | ICD-10-CM | POA: Diagnosis present

## 2019-11-29 DIAGNOSIS — F1010 Alcohol abuse, uncomplicated: Secondary | ICD-10-CM | POA: Diagnosis present

## 2019-11-29 DIAGNOSIS — R609 Edema, unspecified: Secondary | ICD-10-CM

## 2019-11-29 DIAGNOSIS — T148XXA Other injury of unspecified body region, initial encounter: Secondary | ICD-10-CM

## 2019-11-29 DIAGNOSIS — Z419 Encounter for procedure for purposes other than remedying health state, unspecified: Secondary | ICD-10-CM

## 2019-11-29 DIAGNOSIS — S06369A Traumatic hemorrhage of cerebrum, unspecified, with loss of consciousness of unspecified duration, initial encounter: Secondary | ICD-10-CM | POA: Diagnosis not present

## 2019-11-29 DIAGNOSIS — Z930 Tracheostomy status: Secondary | ICD-10-CM | POA: Diagnosis not present

## 2019-11-29 DIAGNOSIS — S06366A Traumatic hemorrhage of cerebrum, unspecified, with loss of consciousness greater than 24 hours without return to pre-existing conscious level with patient surviving, initial encounter: Secondary | ICD-10-CM | POA: Diagnosis not present

## 2019-11-29 DIAGNOSIS — Y92410 Unspecified street and highway as the place of occurrence of the external cause: Secondary | ICD-10-CM

## 2019-11-29 DIAGNOSIS — E870 Hyperosmolality and hypernatremia: Secondary | ICD-10-CM | POA: Diagnosis not present

## 2019-11-29 DIAGNOSIS — F319 Bipolar disorder, unspecified: Secondary | ICD-10-CM | POA: Diagnosis present

## 2019-11-29 DIAGNOSIS — Z515 Encounter for palliative care: Secondary | ICD-10-CM | POA: Diagnosis not present

## 2019-11-29 DIAGNOSIS — Z7189 Other specified counseling: Secondary | ICD-10-CM | POA: Diagnosis not present

## 2019-11-29 DIAGNOSIS — Z0189 Encounter for other specified special examinations: Secondary | ICD-10-CM

## 2019-11-29 DIAGNOSIS — J9600 Acute respiratory failure, unspecified whether with hypoxia or hypercapnia: Secondary | ICD-10-CM | POA: Diagnosis not present

## 2019-11-29 DIAGNOSIS — Z885 Allergy status to narcotic agent status: Secondary | ICD-10-CM | POA: Diagnosis not present

## 2019-11-29 DIAGNOSIS — J9690 Respiratory failure, unspecified, unspecified whether with hypoxia or hypercapnia: Secondary | ICD-10-CM | POA: Diagnosis present

## 2019-11-29 DIAGNOSIS — B958 Unspecified staphylococcus as the cause of diseases classified elsewhere: Secondary | ICD-10-CM | POA: Diagnosis not present

## 2019-11-29 DIAGNOSIS — R7889 Finding of other specified substances, not normally found in blood: Secondary | ICD-10-CM | POA: Diagnosis not present

## 2019-11-29 DIAGNOSIS — Z978 Presence of other specified devices: Secondary | ICD-10-CM

## 2019-11-29 DIAGNOSIS — L8990 Pressure ulcer of unspecified site, unspecified stage: Secondary | ICD-10-CM | POA: Insufficient documentation

## 2019-11-29 LAB — COMPREHENSIVE METABOLIC PANEL
ALT: 21 U/L (ref 0–44)
AST: 44 U/L — ABNORMAL HIGH (ref 15–41)
Albumin: 4.1 g/dL (ref 3.5–5.0)
Alkaline Phosphatase: 42 U/L (ref 38–126)
Anion gap: 16 — ABNORMAL HIGH (ref 5–15)
BUN: 10 mg/dL (ref 6–20)
CO2: 21 mmol/L — ABNORMAL LOW (ref 22–32)
Calcium: 9.2 mg/dL (ref 8.9–10.3)
Chloride: 101 mmol/L (ref 98–111)
Creatinine, Ser: 0.98 mg/dL (ref 0.61–1.24)
GFR calc Af Amer: >60 mL/min (ref >60)
GFR calc non Af Amer: >60 mL/min (ref >60)
Glucose, Bld: 103 mg/dL — ABNORMAL HIGH (ref 70–99)
Potassium: 4.2 mmol/L (ref 3.5–5.1)
Sodium: 138 mmol/L (ref 135–145)
Total Bilirubin: 0.9 mg/dL (ref 0.3–1.2)
Total Protein: 7.1 g/dL (ref 6.5–8.1)

## 2019-11-29 LAB — I-STAT CHEM 8, ED
BUN: 11 mg/dL (ref 6–20)
Calcium, Ion: 1.09 mmol/L — ABNORMAL LOW (ref 1.15–1.40)
Chloride: 103 mmol/L (ref 98–111)
Creatinine, Ser: 1.1 mg/dL (ref 0.61–1.24)
Glucose, Bld: 99 mg/dL (ref 70–99)
HCT: 44 % (ref 39.0–52.0)
Hemoglobin: 15 g/dL (ref 13.0–17.0)
Potassium: 4.2 mmol/L (ref 3.5–5.1)
Sodium: 137 mmol/L (ref 135–145)
TCO2: 24 mmol/L (ref 22–32)

## 2019-11-29 LAB — CBC
HCT: 43.8 % (ref 39.0–52.0)
Hemoglobin: 14.9 g/dL (ref 13.0–17.0)
MCH: 31.7 pg (ref 26.0–34.0)
MCHC: 34 g/dL (ref 30.0–36.0)
MCV: 93.2 fL (ref 80.0–100.0)
Platelets: 210 10*3/uL (ref 150–400)
RBC: 4.7 MIL/uL (ref 4.22–5.81)
RDW: 12.8 % (ref 11.5–15.5)
WBC: 10.4 10*3/uL (ref 4.0–10.5)
nRBC: 0 % (ref 0.0–0.2)

## 2019-11-29 LAB — RESPIRATORY PANEL BY RT PCR (FLU A&B, COVID)
Influenza A by PCR: NEGATIVE
Influenza B by PCR: NEGATIVE
SARS Coronavirus 2 by RT PCR: NEGATIVE

## 2019-11-29 LAB — SAMPLE TO BLOOD BANK

## 2019-11-29 LAB — ETHANOL: Alcohol, Ethyl (B): 129 mg/dL — ABNORMAL HIGH (ref <10)

## 2019-11-29 LAB — LACTIC ACID, PLASMA: Lactic Acid, Venous: 3.1 mmol/L (ref 0.5–1.9)

## 2019-11-29 LAB — PROTIME-INR
INR: 1.1 (ref 0.8–1.2)
Prothrombin Time: 13.6 seconds (ref 11.4–15.2)

## 2019-11-29 MED ORDER — ONDANSETRON 4 MG PO TBDP: 4.0000 mg | ORAL_TABLET | Freq: Four times a day (QID) | ORAL | Status: DC | PRN

## 2019-11-29 MED ORDER — LEVETIRACETAM IN NACL 500 MG/100ML IV SOLN
500.0000 mg | Freq: Two times a day (BID) | INTRAVENOUS | Status: DC
  Administered 2019-11-30: 500 mg via INTRAVENOUS
  Filled 2019-11-29: qty 100

## 2019-11-29 MED ORDER — FENTANYL CITRATE (PF) 100 MCG/2ML IJ SOLN
50.0000 ug | INTRAMUSCULAR | Status: AC | PRN
  Administered 2019-12-17 – 2019-12-18 (×3): 50 ug via INTRAVENOUS
  Filled 2019-11-29 (×3): qty 2

## 2019-11-29 MED ORDER — PROPOFOL 1000 MG/100ML IV EMUL
0.0000 ug/kg/min | INTRAVENOUS | Status: DC
  Administered 2019-11-29: 10 ug/kg/min via INTRAVENOUS
  Administered 2019-11-30 (×2): 30 ug/kg/min via INTRAVENOUS
  Filled 2019-11-29: qty 100

## 2019-11-29 MED ORDER — ROCURONIUM BROMIDE 50 MG/5ML IV SOLN
100.0000 mg | Freq: Once | INTRAVENOUS | Status: AC
  Administered 2019-11-29: 100 mg via INTRAVENOUS
  Filled 2019-11-29: qty 10

## 2019-11-29 MED ORDER — FENTANYL CITRATE (PF) 100 MCG/2ML IJ SOLN
50.0000 ug | INTRAMUSCULAR | Status: DC | PRN
  Administered 2019-11-30 – 2019-12-13 (×5): 100 ug via INTRAVENOUS
  Administered 2019-12-17 – 2019-12-18 (×5): 50 ug via INTRAVENOUS
  Administered 2019-12-18: 75 ug via INTRAVENOUS
  Administered 2019-12-18 (×4): 50 ug via INTRAVENOUS
  Administered 2019-12-18: 100 ug via INTRAVENOUS
  Administered 2019-12-18: 50 ug via INTRAVENOUS
  Administered 2019-12-19 (×2): 100 ug via INTRAVENOUS
  Administered 2019-12-19: 50 ug via INTRAVENOUS
  Administered 2019-12-19 – 2019-12-21 (×10): 100 ug via INTRAVENOUS
  Administered 2019-12-22 (×2): 50 ug via INTRAVENOUS
  Filled 2019-11-29 (×31): qty 2
  Filled 2019-11-29: qty 4
  Filled 2019-11-29 (×3): qty 2

## 2019-11-29 MED ORDER — ACETAMINOPHEN 10 MG/ML IV SOLN
1000.0000 mg | Freq: Four times a day (QID) | INTRAVENOUS | Status: AC
  Administered 2019-11-30 (×4): 1000 mg via INTRAVENOUS
  Filled 2019-11-29 (×4): qty 100

## 2019-11-29 MED ORDER — ONDANSETRON HCL 4 MG/2ML IJ SOLN
4.0000 mg | Freq: Four times a day (QID) | INTRAMUSCULAR | Status: DC | PRN
  Administered 2019-12-03: 4 mg via INTRAVENOUS
  Filled 2019-11-29: qty 2

## 2019-11-29 MED ORDER — LEVETIRACETAM IN NACL 1000 MG/100ML IV SOLN
1000.0000 mg | Freq: Once | INTRAVENOUS | Status: AC
  Administered 2019-11-29: 1000 mg via INTRAVENOUS

## 2019-11-29 MED ORDER — OXYCODONE HCL 5 MG PO TABS
5.0000 mg | ORAL_TABLET | ORAL | Status: DC | PRN
  Filled 2019-11-29: qty 2

## 2019-11-29 MED ORDER — MORPHINE SULFATE (PF) 4 MG/ML IV SOLN
4.0000 mg | INTRAVENOUS | Status: DC | PRN
  Filled 2019-11-29: qty 1

## 2019-11-29 MED ORDER — CEFAZOLIN SODIUM-DEXTROSE 2-4 GM/100ML-% IV SOLN
2.0000 g | Freq: Once | INTRAVENOUS | Status: AC
  Administered 2019-11-29: 2 g via INTRAVENOUS

## 2019-11-29 MED ORDER — MIDAZOLAM HCL 2 MG/2ML IJ SOLN
2.0000 mg | INTRAMUSCULAR | Status: DC | PRN
  Administered 2019-11-29 – 2019-12-10 (×3): 2 mg via INTRAVENOUS
  Filled 2019-11-29 (×2): qty 2

## 2019-11-29 MED ORDER — TETANUS-DIPHTH-ACELL PERTUSSIS 5-2.5-18.5 LF-MCG/0.5 IM SUSP
0.5000 mL | Freq: Once | INTRAMUSCULAR | Status: AC
  Administered 2019-11-29: 0.5 mL via INTRAMUSCULAR

## 2019-11-29 MED ORDER — SODIUM CHLORIDE 0.9 % IV SOLN: INTRAVENOUS | Status: DC

## 2019-11-29 MED ORDER — PROPOFOL 1000 MG/100ML IV EMUL
INTRAVENOUS | Status: AC
  Filled 2019-11-29: qty 100

## 2019-11-29 MED ORDER — MIDAZOLAM HCL 2 MG/2ML IJ SOLN
INTRAMUSCULAR | Status: AC
  Filled 2019-11-29: qty 2

## 2019-11-29 MED ORDER — ETOMIDATE 2 MG/ML IV SOLN
25.0000 mg | Freq: Once | INTRAVENOUS | Status: AC
  Administered 2019-11-29: 25 mg via INTRAVENOUS

## 2019-11-29 MED ORDER — DOCUSATE SODIUM 100 MG PO CAPS: 100.0000 mg | ORAL_CAPSULE | Freq: Two times a day (BID) | ORAL | Status: DC

## 2019-11-29 MED ORDER — IOHEXOL 300 MG/ML  SOLN
100.0000 mL | Freq: Once | INTRAMUSCULAR | Status: AC | PRN
  Administered 2019-11-29: 100 mL via INTRAVENOUS

## 2019-11-29 NOTE — Procedures (Signed)
Intubation Procedure Note Clinton [name] [id number] [birth year]  Procedure: Intubation Indications: Airway protection and maintenance  Procedure Details Consent: Unable to obtain consent because of emergent medical necessity. Time Out: Verified patient identification, verified procedure, site/side was marked, verified correct patient position, special equipment/implants available, medications/allergies/relevent history reviewed, required imaging and test results available.  Performed  Maximum sterile technique was used including gloves.  4   Evaluation Hemodynamic Status: BP stable throughout; O2 sats: stable throughout Patient's Current Condition: stable Complications: No apparent complications Patient did tolerate procedure well. Chest X-ray ordered to verify placement.  CXR: pending.   Jesusita Oka 11/29/2019
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
## 2019-11-30 ENCOUNTER — Inpatient Hospital Stay (HOSPITAL_COMMUNITY): Payer: Medicaid Other | Admitting: Certified Registered Nurse Anesthetist

## 2019-11-30 ENCOUNTER — Inpatient Hospital Stay (HOSPITAL_COMMUNITY): Payer: Medicaid Other

## 2019-11-30 ENCOUNTER — Encounter (HOSPITAL_COMMUNITY): Admission: EM | Disposition: A | Discharge status: Skilled Nursing Facility | Payer: Self-pay | Source: Home / Self Care

## 2019-11-30 DIAGNOSIS — T1490XA Injury, unspecified, initial encounter: Secondary | ICD-10-CM

## 2019-11-30 DIAGNOSIS — I629 Nontraumatic intracranial hemorrhage, unspecified: Secondary | ICD-10-CM

## 2019-11-30 DIAGNOSIS — I609 Nontraumatic subarachnoid hemorrhage, unspecified: Secondary | ICD-10-CM

## 2019-11-30 HISTORY — PX: TIBIA IM NAIL INSERTION: SHX2516

## 2019-11-30 LAB — BASIC METABOLIC PANEL
Anion gap: 15 (ref 5–15)
BUN: 10 mg/dL (ref 6–20)
CO2: 18 mmol/L — ABNORMAL LOW (ref 22–32)
Calcium: 8.4 mg/dL — ABNORMAL LOW (ref 8.9–10.3)
Chloride: 104 mmol/L (ref 98–111)
Creatinine, Ser: 1.02 mg/dL (ref 0.61–1.24)
GFR calc Af Amer: >60 mL/min (ref >60)
GFR calc non Af Amer: >60 mL/min (ref >60)
Glucose, Bld: 90 mg/dL (ref 70–99)
Potassium: 3.9 mmol/L (ref 3.5–5.1)
Sodium: 137 mmol/L (ref 135–145)

## 2019-11-30 LAB — URINALYSIS, ROUTINE W REFLEX MICROSCOPIC
Bilirubin Urine: NEGATIVE
Glucose, UA: NEGATIVE mg/dL
Hgb urine dipstick: NEGATIVE
Ketones, ur: 20 mg/dL — AB
Leukocytes,Ua: NEGATIVE
Nitrite: NEGATIVE
Protein, ur: 30 mg/dL — AB
Specific Gravity, Urine: 1.04 — ABNORMAL HIGH (ref 1.005–1.030)
pH: 5 (ref 5.0–8.0)

## 2019-11-30 LAB — TRIGLYCERIDES: Triglycerides: 182 mg/dL — ABNORMAL HIGH (ref <150)

## 2019-11-30 LAB — CBC
HCT: 39 % (ref 39.0–52.0)
Hemoglobin: 13.2 g/dL (ref 13.0–17.0)
MCH: 31.7 pg (ref 26.0–34.0)
MCHC: 33.8 g/dL (ref 30.0–36.0)
MCV: 93.8 fL (ref 80.0–100.0)
Platelets: 222 10*3/uL (ref 150–400)
RBC: 4.16 MIL/uL — ABNORMAL LOW (ref 4.22–5.81)
RDW: 12.8 % (ref 11.5–15.5)
WBC: 11.2 10*3/uL — ABNORMAL HIGH (ref 4.0–10.5)
nRBC: 0 % (ref 0.0–0.2)

## 2019-11-30 LAB — HIV ANTIBODY (ROUTINE TESTING W REFLEX): HIV Screen 4th Generation wRfx: NONREACTIVE

## 2019-11-30 LAB — SURGICAL PCR SCREEN
MRSA, PCR: NEGATIVE
Staphylococcus aureus: NEGATIVE

## 2019-11-30 LAB — MRSA PCR SCREENING: MRSA by PCR: NEGATIVE

## 2019-11-30 SURGERY — INSERTION, INTRAMEDULLARY ROD, TIBIA
Anesthesia: General | Site: Leg Lower | Laterality: Left

## 2019-11-30 MED ORDER — CEFAZOLIN SODIUM-DEXTROSE 2-4 GM/100ML-% IV SOLN
2.0000 g | Freq: Three times a day (TID) | INTRAVENOUS | Status: AC
  Administered 2019-11-30 – 2019-12-01 (×3): 2 g via INTRAVENOUS
  Filled 2019-11-30 (×3): qty 100

## 2019-11-30 MED ORDER — LEVETIRACETAM IN NACL 1000 MG/100ML IV SOLN
1000.0000 mg | Freq: Two times a day (BID) | INTRAVENOUS | Status: DC
  Administered 2019-11-30 – 2019-12-11 (×23): 1000 mg via INTRAVENOUS
  Filled 2019-11-30 (×23): qty 100

## 2019-11-30 MED ORDER — DOCUSATE SODIUM 50 MG/5ML PO LIQD
100.0000 mg | Freq: Two times a day (BID) | ORAL | Status: DC
  Administered 2019-11-30 – 2019-12-12 (×18): 100 mg
  Filled 2019-11-30 (×22): qty 10

## 2019-11-30 MED ORDER — FENTANYL CITRATE (PF) 250 MCG/5ML IJ SOLN
INTRAMUSCULAR | Status: AC
  Filled 2019-11-30: qty 5

## 2019-11-30 MED ORDER — POLYETHYLENE GLYCOL 3350 17 G PO PACK
17.0000 g | PACK | Freq: Two times a day (BID) | ORAL | Status: DC
  Administered 2019-11-30 – 2019-12-12 (×15): 17 g
  Filled 2019-11-30 (×18): qty 1

## 2019-11-30 MED ORDER — PANTOPRAZOLE SODIUM 40 MG IV SOLR
40.0000 mg | INTRAVENOUS | Status: DC
  Administered 2019-11-30 – 2019-12-18 (×11): 40 mg via INTRAVENOUS
  Filled 2019-11-30 (×11): qty 40

## 2019-11-30 MED ORDER — TOBRAMYCIN SULFATE 1.2 G IJ SOLR
INTRAMUSCULAR | Status: AC
  Filled 2019-11-30: qty 1.2

## 2019-11-30 MED ORDER — ORAL CARE MOUTH RINSE
15.0000 mL | OROMUCOSAL | Status: DC
  Administered 2019-11-30 – 2019-12-16 (×153): 15 mL via OROMUCOSAL

## 2019-11-30 MED ORDER — MIDAZOLAM HCL 2 MG/2ML IJ SOLN
INTRAMUSCULAR | Status: AC
  Filled 2019-11-30: qty 2

## 2019-11-30 MED ORDER — PANTOPRAZOLE SODIUM 40 MG PO PACK
40.0000 mg | PACK | ORAL | Status: DC
  Administered 2019-12-04 – 2019-12-17 (×8): 40 mg
  Filled 2019-11-30 (×8): qty 20

## 2019-11-30 MED ORDER — FENTANYL 2500MCG IN NS 250ML (10MCG/ML) PREMIX INFUSION
50.0000 ug/h | INTRAVENOUS | Status: DC
  Administered 2019-11-30 – 2019-12-04 (×2): 50 ug/h via INTRAVENOUS
  Administered 2019-12-07: 25 ug/h via INTRAVENOUS
  Administered 2019-12-09: 50 ug/h via INTRAVENOUS
  Administered 2019-12-10: 100 ug/h via INTRAVENOUS
  Administered 2019-12-11: 50 ug/h via INTRAVENOUS
  Administered 2019-12-13 – 2019-12-14 (×4): 150 ug/h via INTRAVENOUS
  Administered 2019-12-15: 75 ug/h via INTRAVENOUS
  Filled 2019-11-30 (×11): qty 250

## 2019-11-30 MED ORDER — MUPIROCIN 2 % EX OINT
1.0000 "application " | TOPICAL_OINTMENT | Freq: Two times a day (BID) | CUTANEOUS | Status: AC
  Administered 2019-11-30 – 2019-12-04 (×10): 1 via NASAL
  Filled 2019-11-30 (×2): qty 22

## 2019-11-30 MED ORDER — 0.9 % SODIUM CHLORIDE (POUR BTL) OPTIME
TOPICAL | Status: DC | PRN
  Administered 2019-11-30: 1000 mL

## 2019-11-30 MED ORDER — VANCOMYCIN HCL 1000 MG IV SOLR
INTRAVENOUS | Status: AC
  Filled 2019-11-30: qty 1000

## 2019-11-30 MED ORDER — CEFAZOLIN SODIUM-DEXTROSE 2-4 GM/100ML-% IV SOLN: 2.0000 g | INTRAVENOUS | Status: DC

## 2019-11-30 MED ORDER — POVIDONE-IODINE 10 % EX SWAB: 2.0000 "application " | Freq: Once | CUTANEOUS | Status: DC

## 2019-11-30 MED ORDER — MIDAZOLAM HCL 5 MG/5ML IJ SOLN
INTRAMUSCULAR | Status: DC | PRN
  Administered 2019-11-30: 2 mg via INTRAVENOUS

## 2019-11-30 MED ORDER — ROCURONIUM BROMIDE 100 MG/10ML IV SOLN
INTRAVENOUS | Status: DC | PRN
  Administered 2019-11-30: 50 mg via INTRAVENOUS
  Administered 2019-11-30: 10 mg via INTRAVENOUS

## 2019-11-30 MED ORDER — CHLORHEXIDINE GLUCONATE 4 % EX LIQD
60.0000 mL | Freq: Once | CUTANEOUS | Status: DC
  Filled 2019-11-30 (×2): qty 60

## 2019-11-30 MED ORDER — FENTANYL CITRATE (PF) 100 MCG/2ML IJ SOLN
INTRAMUSCULAR | Status: DC | PRN
  Administered 2019-11-30 (×3): 50 ug via INTRAVENOUS
  Administered 2019-11-30 (×2): 100 ug via INTRAVENOUS

## 2019-11-30 MED ORDER — PROPRANOLOL HCL 20 MG/5ML PO SOLN
20.0000 mg | Freq: Two times a day (BID) | ORAL | Status: DC
  Administered 2019-11-30 – 2019-12-18 (×37): 20 mg
  Filled 2019-11-30 (×39): qty 5

## 2019-11-30 MED ORDER — PROPOFOL 1000 MG/100ML IV EMUL
0.0000 ug/kg/min | INTRAVENOUS | Status: DC
  Administered 2019-11-30: 15 ug/kg/min via INTRAVENOUS

## 2019-11-30 MED ORDER — VALPROIC ACID 250 MG/5ML PO SOLN
750.0000 mg | Freq: Every evening | ORAL | Status: DC
  Administered 2019-11-30 – 2019-12-31 (×31): 750 mg
  Filled 2019-11-30 (×32): qty 15

## 2019-11-30 MED ORDER — VALPROIC ACID 250 MG/5ML PO SOLN
500.0000 mg | Freq: Every day | ORAL | Status: DC
  Administered 2019-12-01 – 2020-01-01 (×33): 500 mg
  Filled 2019-11-30 (×33): qty 10

## 2019-11-30 MED ORDER — CHLORHEXIDINE GLUCONATE CLOTH 2 % EX PADS
6.0000 | MEDICATED_PAD | Freq: Every day | CUTANEOUS | Status: DC
  Administered 2019-11-30 – 2020-01-01 (×30): 6 via TOPICAL

## 2019-11-30 MED ORDER — LACTATED RINGERS IV SOLN: INTRAVENOUS | Status: DC | PRN

## 2019-11-30 MED ORDER — CHLORHEXIDINE GLUCONATE 0.12% ORAL RINSE (MEDLINE KIT)
15.0000 mL | Freq: Two times a day (BID) | OROMUCOSAL | Status: DC
  Administered 2019-11-30 – 2019-12-16 (×32): 15 mL via OROMUCOSAL

## 2019-11-30 MED ORDER — VANCOMYCIN HCL 1000 MG IV SOLR
INTRAVENOUS | Status: DC | PRN
  Administered 2019-11-30: 1000 mg via TOPICAL

## 2019-11-30 SURGICAL SUPPLY — 68 items
APL PRP STRL LF DISP 70% ISPRP (MISCELLANEOUS) ×1
BIT DRILL CALIBRATED 4.2 (BIT) IMPLANT
BIT DRILL SHORT 3.2MM (DRILL) IMPLANT
BIT DRILL SHORT 4.2 (BIT) IMPLANT
BLADE SURG 10 STRL SS (BLADE) ×6 IMPLANT
BNDG CMPR MED 10X6 ELC LF (GAUZE/BANDAGES/DRESSINGS) ×1
BNDG COHESIVE 4X5 TAN STRL (GAUZE/BANDAGES/DRESSINGS) ×3 IMPLANT
BNDG ELASTIC 4X5.8 VLCR STR LF (GAUZE/BANDAGES/DRESSINGS) ×3 IMPLANT
BNDG ELASTIC 6X10 VLCR STRL LF (GAUZE/BANDAGES/DRESSINGS) ×2 IMPLANT
BNDG ELASTIC 6X5.8 VLCR STR LF (GAUZE/BANDAGES/DRESSINGS) ×3 IMPLANT
BNDG GAUZE ELAST 4 BULKY (GAUZE/BANDAGES/DRESSINGS) ×3 IMPLANT
BRUSH SCRUB EZ PLAIN DRY (MISCELLANEOUS) ×6 IMPLANT
CHLORAPREP W/TINT 26 (MISCELLANEOUS) ×3 IMPLANT
CLOSURE WOUND 1/2 X4 (GAUZE/BANDAGES/DRESSINGS) ×1
COVER SURGICAL LIGHT HANDLE (MISCELLANEOUS) ×6 IMPLANT
COVER WAND RF STERILE (DRAPES) ×3 IMPLANT
DRAPE C-ARM 42X72 X-RAY (DRAPES) ×3 IMPLANT
DRAPE C-ARMOR (DRAPES) ×3 IMPLANT
DRAPE HALF SHEET 40X57 (DRAPES) ×6 IMPLANT
DRAPE IMP U-DRAPE 54X76 (DRAPES) ×6 IMPLANT
DRAPE INCISE IOBAN 66X45 STRL (DRAPES) IMPLANT
DRAPE ORTHO SPLIT 77X108 STRL (DRAPES) ×6
DRAPE SURG ORHT 6 SPLT 77X108 (DRAPES) ×2 IMPLANT
DRAPE U-SHAPE 47X51 STRL (DRAPES) ×3 IMPLANT
DRILL BIT CALIBRATED 4.2 (BIT) ×3
DRILL BIT SHORT 4.2 (BIT) ×6
DRILL SHORT 3.2MM (DRILL) ×3
DRSG ADAPTIC 3X8 NADH LF (GAUZE/BANDAGES/DRESSINGS) ×3 IMPLANT
ELECT REM PT RETURN 9FT ADLT (ELECTROSURGICAL) ×3
ELECTRODE REM PT RTRN 9FT ADLT (ELECTROSURGICAL) ×1 IMPLANT
GAUZE SPONGE 4X4 12PLY STRL (GAUZE/BANDAGES/DRESSINGS) ×3 IMPLANT
GLOVE BIO SURGEON STRL SZ 6.5 (GLOVE) ×6 IMPLANT
GLOVE BIO SURGEON STRL SZ7.5 (GLOVE) ×12 IMPLANT
GLOVE BIO SURGEONS STRL SZ 6.5 (GLOVE) ×3
GLOVE BIOGEL PI IND STRL 6.5 (GLOVE) ×1 IMPLANT
GLOVE BIOGEL PI IND STRL 7.5 (GLOVE) ×1 IMPLANT
GLOVE BIOGEL PI INDICATOR 6.5 (GLOVE) ×2
GLOVE BIOGEL PI INDICATOR 7.5 (GLOVE) ×2
GOWN STRL REUS W/ TWL LRG LVL3 (GOWN DISPOSABLE) ×2 IMPLANT
GOWN STRL REUS W/TWL LRG LVL3 (GOWN DISPOSABLE) ×6
GUIDEWIRE 3.2X400 (WIRE) ×2 IMPLANT
KIT BASIN OR (CUSTOM PROCEDURE TRAY) ×3 IMPLANT
KIT TURNOVER KIT B (KITS) ×3 IMPLANT
NAIL TIBIAL W/PROX BEND 10X345 (Nail) ×2 IMPLANT
PACK TOTAL JOINT (CUSTOM PROCEDURE TRAY) ×3 IMPLANT
PAD ARMBOARD 7.5X6 YLW CONV (MISCELLANEOUS) ×6 IMPLANT
PAD CAST 4YDX4 CTTN HI CHSV (CAST SUPPLIES) IMPLANT
PADDING CAST COTTON 4X4 STRL (CAST SUPPLIES) ×3
PADDING CAST COTTON 6X4 STRL (CAST SUPPLIES) ×2 IMPLANT
REAMER ROD DEEP FLUTE 2.5X950 (INSTRUMENTS) ×2 IMPLANT
ROD REAMER STRT BALL 3.0X950 (INSTRUMENTS) ×2 IMPLANT
SCREW LOCK STAR 5X30 (Screw) ×2 IMPLANT
SCREW LOCK STAR 5X38 (Screw) ×2 IMPLANT
SCREW LOCK STAR 5X40 (Screw) ×2 IMPLANT
SCREW LOCK STAR 5X42 (Screw) ×2 IMPLANT
SCREW LOCK STAR 5X66 (Screw) ×2 IMPLANT
SCREW LOCK STAR 5X80 (Screw) ×2 IMPLANT
SCREW LOCK TI 4X42 (Screw) ×2 IMPLANT
STAPLER VISISTAT 35W (STAPLE) ×3 IMPLANT
STRIP CLOSURE SKIN 1/2X4 (GAUZE/BANDAGES/DRESSINGS) ×1 IMPLANT
SUT MNCRL AB 3-0 PS2 18 (SUTURE) ×3 IMPLANT
SUT VIC AB 0 CT1 27 (SUTURE) ×6
SUT VIC AB 0 CT1 27XBRD ANBCTR (SUTURE) IMPLANT
SUT VIC AB 2-0 CT1 27 (SUTURE) ×3
SUT VIC AB 2-0 CT1 TAPERPNT 27 (SUTURE) IMPLANT
TOWEL GREEN STERILE (TOWEL DISPOSABLE) ×6 IMPLANT
TOWEL GREEN STERILE FF (TOWEL DISPOSABLE) ×3 IMPLANT
YANKAUER SUCT BULB TIP NO VENT (SUCTIONS) IMPLANT

## 2019-11-30 NOTE — Progress Notes (Signed)
SLP Cancellation Note  Patient Details Name: Clinton [name] MRN: [id number] DOB: 1996/04/22   Cancelled treatment:       Reason Eval/Treat Not Completed: Medical issues which prohibited therapy. SLP order received. Pt is currently still on the vent. Will follow up for evaluation post-extubation.    Sarra Rachels L. Tivis Ringer, [place] Office number [phone] Pager [phone]  [name] 11/30/2019, 9:16 AM
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
## 2019-11-30 NOTE — Progress Notes (Signed)
PT Cancellation Note  Patient Details Name: Clinton [name] MRN: [id number] DOB: April 27, 1996   Cancelled Treatment:    Reason Eval/Treat Not Completed: Medical issues which prohibited therapy. Only responding to high levels of stimulation per RN. Also awaiting possible sx later today for tib/fib fx. Will hold and return as time allows.   Zenaida Niece 11/30/2019, 9:42 AM

## 2019-11-30 NOTE — Anesthesia Preprocedure Evaluation (Addendum)
Anesthesia Evaluation   Patient unresponsive    Reviewed: Allergy & Precautions, Patient's Chart, lab work & pertinent test results  Airway Mallampati: Intubated       Dental   Pulmonary  Intubated      + intubated    Cardiovascular negative cardio ROS Normal cardiovascular exam     Neuro/Psych Seizures -,  ICH (intracerebral hemorrhage TBI    GI/Hepatic negative GI ROS, Neg liver ROS,   Endo/Other  negative endocrine ROS  Renal/GU negative Renal ROS     Musculoskeletal negative musculoskeletal ROS (+)   Abdominal   Peds  Hematology negative hematology ROS (+)   Anesthesia Other Findings Left tibia fx  Reproductive/Obstetrics                           Anesthesia Physical Anesthesia Plan  ASA: II  Anesthesia Plan: General   Post-op Pain Management:    Induction: Intravenous  PONV Risk Score and Plan: 2 and Ondansetron, Dexamethasone, Midazolam and Treatment may vary due to age or medical condition  Airway Management Planned: Oral ETT  Additional Equipment:   Intra-op Plan:   Post-operative Plan: Post-operative intubation/ventilation  Informed Consent: I have reviewed the patients History and Physical, chart, labs and discussed the procedure including the risks, benefits and alternatives for the proposed anesthesia with the patient or authorized representative who has indicated his/her understanding and acceptance.     Consent reviewed with POA  Plan Discussed with: CRNA  Anesthesia Plan Comments: (Anesthetic plan discussed with father via telephone )       Anesthesia Quick Evaluation

## 2019-11-30 NOTE — Progress Notes (Addendum)
1352Chrys Racer, RN called Hilbert Odor, PA-C at this time regarding if patient's father, Tami Ribas [phone]), had been informed about patient needing orthopedic surgery for L tib-fib fracture. PA-C will explain procedure, risks, benefits, etc to father when he arrives to patient's room.  1355: RN also called Jebadia and he stated he is on his way from [place] to the hospital now. Per request, RN will call PA-C when father arrives.   1414: Dr. Doreatha Martin at bedside and currently on the phone with patient's father.

## 2019-11-30 NOTE — ED Notes (Signed)
Pt's belongings taken home by father and sister.
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
## 2019-11-30 NOTE — Progress Notes (Signed)
Patient ID: Clinton [name], male   DOB: 1995-10-20, 24 y.o.   MRN: [id number] Overall seems stable, pupils sluggish but equal, off propofol he felxes on the right and now somewhat in the LUE which is surprising, good gag. Continue supportive care. I do not feel an ICP monitor will change his care. CT head tomorrow
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
## 2019-11-30 NOTE — Transfer of Care (Signed)
Immediate Anesthesia Transfer of Care Note  Patient: Clinton [name]  Procedure(s) Performed: INTRAMEDULLARY LEFT TIBIAL NAIL (Left Leg Lower)  Patient Location: ICU  Anesthesia Type:General  Level of Consciousness: sedated and Patient remains intubated per anesthesia plan  Airway & Oxygen Therapy: Patient remains intubated per anesthesia plan and Patient placed on Ventilator (see vital sign flow sheet for setting)  Post-op Assessment: Report given to RN and Post -op Vital signs reviewed and stable  Post vital signs: Reviewed and stable  Last Vitals:  Vitals Value Taken Time  BP 141/81 11/30/19 1657  Temp    Pulse 95 11/30/19 1658  Resp 15 11/30/19 1658  SpO2 100 % 11/30/19 1658  Vitals shown include unvalidated device data.  Last Pain:  Vitals:   11/30/19 0800  TempSrc: Axillary         Complications: No apparent anesthesia complications   Transported to and from OR with full monitors.  Tolerated transport and surgery well.  VS remain stable.  Report to RN at bedside.

## 2019-11-30 NOTE — Progress Notes (Signed)
RT NOTE:  Pt transported to 4N without event. Report given to [place], RT.

## 2019-11-30 NOTE — Anesthesia Postprocedure Evaluation (Signed)
Anesthesia Post Note  Patient: Clinton [name]  Procedure(s) Performed: INTRAMEDULLARY LEFT TIBIAL NAIL (Left Leg Lower)     Patient location during evaluation: ICU Anesthesia Type: General Level of consciousness: sedated Pain management: pain level controlled Vital Signs Assessment: post-procedure vital signs reviewed and stable Respiratory status: patient remains intubated per anesthesia plan Cardiovascular status: stable Postop Assessment: no apparent nausea or vomiting Anesthetic complications: no    Last Vitals:  Vitals:   11/30/19 1900 11/30/19 2000  BP: (!) 144/96   Pulse: 74   Resp: 15   Temp:  37.1 C  SpO2: 100%     Last Pain:  Vitals:   11/30/19 2000  TempSrc: Oral                 Ryan P Ellender

## 2019-11-30 NOTE — ED Notes (Signed)
Estill Bamberg (father) [phone] and Linward Natal (sister) [phone], call for updates/question.
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
## 2019-11-30 NOTE — Progress Notes (Signed)
OT Cancellation Note  Patient Details Name: Clinton [name] MRN: [id number] DOB: Dec 26, 1995   Cancelled Treatment:    Reason Eval/Treat Not Completed: Patient not medically ready;Medical issues which prohibited therapy(Intubated. Only responding to high levels of stimulation per RN. Also awaiting possible sx later today for tib/fib fx. Will hold and return as schedule allows. Thank you.)  [place], OTR/L Acute Rehab Pager: [phone] Office: [phone] 11/30/2019, 9:09 AM

## 2019-11-30 NOTE — Progress Notes (Signed)
EEG complete - results pending 

## 2019-12-01 ENCOUNTER — Inpatient Hospital Stay (HOSPITAL_COMMUNITY): Payer: Medicaid Other

## 2019-12-01 ENCOUNTER — Encounter: Payer: Self-pay | Admitting: *Deleted

## 2019-12-01 DIAGNOSIS — S82262A Displaced segmental fracture of shaft of left tibia, initial encounter for closed fracture: Secondary | ICD-10-CM

## 2019-12-01 LAB — GLUCOSE, CAPILLARY
Glucose-Capillary: 137 mg/dL — ABNORMAL HIGH (ref 70–99)
Glucose-Capillary: 137 mg/dL — ABNORMAL HIGH (ref 70–99)
Glucose-Capillary: 141 mg/dL — ABNORMAL HIGH (ref 70–99)
Glucose-Capillary: 143 mg/dL — ABNORMAL HIGH (ref 70–99)

## 2019-12-01 LAB — CBC
HCT: 24.2 % — ABNORMAL LOW (ref 39.0–52.0)
Hemoglobin: 8.1 g/dL — ABNORMAL LOW (ref 13.0–17.0)
MCH: 31.6 pg (ref 26.0–34.0)
MCHC: 33.5 g/dL (ref 30.0–36.0)
MCV: 94.5 fL (ref 80.0–100.0)
Platelets: 130 10*3/uL — ABNORMAL LOW (ref 150–400)
RBC: 2.56 MIL/uL — ABNORMAL LOW (ref 4.22–5.81)
RDW: 12.6 % (ref 11.5–15.5)
WBC: 6.5 10*3/uL (ref 4.0–10.5)
nRBC: 0 % (ref 0.0–0.2)

## 2019-12-01 LAB — BASIC METABOLIC PANEL
Anion gap: 6 (ref 5–15)
BUN: 9 mg/dL (ref 6–20)
CO2: 27 mmol/L (ref 22–32)
Calcium: 8 mg/dL — ABNORMAL LOW (ref 8.9–10.3)
Chloride: 103 mmol/L (ref 98–111)
Creatinine, Ser: 0.85 mg/dL (ref 0.61–1.24)
GFR calc Af Amer: >60 mL/min (ref >60)
GFR calc non Af Amer: >60 mL/min (ref >60)
Glucose, Bld: 124 mg/dL — ABNORMAL HIGH (ref 70–99)
Potassium: 3.9 mmol/L (ref 3.5–5.1)
Sodium: 136 mmol/L (ref 135–145)

## 2019-12-01 LAB — TRIGLYCERIDES: Triglycerides: 128 mg/dL (ref <150)

## 2019-12-01 LAB — PHOSPHORUS
Phosphorus: 1.9 mg/dL — ABNORMAL LOW (ref 2.5–4.6)
Phosphorus: 2.5 mg/dL (ref 2.5–4.6)

## 2019-12-01 LAB — VITAMIN D 25 HYDROXY (VIT D DEFICIENCY, FRACTURES): Vit D, 25-Hydroxy: 26.37 ng/mL — ABNORMAL LOW (ref 30–100)

## 2019-12-01 LAB — MAGNESIUM: Magnesium: 1.8 mg/dL (ref 1.7–2.4)

## 2019-12-01 MED ORDER — PIVOT 1.5 CAL PO LIQD
1000.0000 mL | ORAL | Status: DC
  Administered 2019-12-01 – 2019-12-04 (×3): 1000 mL

## 2019-12-01 MED ORDER — MAGNESIUM SULFATE 2 GM/50ML IV SOLN
2.0000 g | Freq: Once | INTRAVENOUS | Status: AC
  Administered 2019-12-01: 2 g via INTRAVENOUS
  Filled 2019-12-01: qty 50

## 2019-12-01 MED ORDER — VITAL HIGH PROTEIN PO LIQD: 1000.0000 mL | ORAL | Status: DC

## 2019-12-01 MED ORDER — PRO-STAT SUGAR FREE PO LIQD
30.0000 mL | Freq: Every day | ORAL | Status: DC
  Administered 2019-12-02 – 2019-12-07 (×6): 30 mL
  Filled 2019-12-01 (×6): qty 30

## 2019-12-01 MED ORDER — PRO-STAT SUGAR FREE PO LIQD
30.0000 mL | Freq: Two times a day (BID) | ORAL | Status: DC
  Administered 2019-12-01: 30 mL
  Filled 2019-12-01: qty 30

## 2019-12-01 MED ORDER — SODIUM PHOSPHATES 45 MMOLE/15ML IV SOLN
30.0000 mmol | Freq: Once | INTRAVENOUS | Status: AC
  Administered 2019-12-01: 30 mmol via INTRAVENOUS
  Filled 2019-12-01: qty 10
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
## 2019-12-01 NOTE — Progress Notes (Signed)
PT Cancellation Note  Patient Details Name: Clinton [name] MRN: [id number] DOB: Dec 15, 1995   Cancelled Treatment:    Reason Eval/Treat Not Completed: Patient's level of consciousness;Patient not medically ready. Pt with a GCS of 3, unable to participate in skilled PT intervention at this time. PT will hold until patient becomes more alert and better able to participate in PT evaluation.   Zenaida Niece 12/01/2019, 7:48 AM

## 2019-12-01 NOTE — Plan of Care (Signed)
  Problem: Education: Goal: Knowledge of the prescribed therapeutic regimen Outcome: Progressing Goal: Knowledge of disease or condition will improve Outcome: Progressing   Problem: Clinical Measurements: Goal: Neurologic status will improve Outcome: Progressing   Problem: Tissue Perfusion: Goal: Ability to maintain intracranial pressure will improve Outcome: Progressing   Problem: Respiratory: Goal: Will regain and/or maintain adequate ventilation Outcome: Progressing   Problem: Skin Integrity: Goal: Risk for impaired skin integrity will decrease Outcome: Progressing Goal: Demonstration of wound healing without infection will improve Outcome: Progressing   Problem: Psychosocial: Goal: Ability to verbalize positive feelings about self will improve Outcome: Progressing Goal: Ability to participate in self-care as condition permits will improve Outcome: Progressing Goal: Ability to identify appropriate support needs will improve Outcome: Progressing   Problem: Health Behavior/Discharge Planning: Goal: Ability to manage health-related needs will improve Outcome: Progressing   Problem: Nutritional: Goal: Risk of aspiration will decrease Outcome: Progressing Goal: Dietary intake will improve Outcome: Progressing   Problem: Communication: Goal: Ability to communicate needs accurately will improve Outcome: Progressing
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
## 2019-12-01 NOTE — Progress Notes (Signed)
Patient ID: Clinton [name], male   DOB: [birth year], 24 y.o.   MRN: [id number] Looks better. Opens eyes, not FC, tries to localize on R, not much movement on L. MS much better. CT head reviewed. He's starting to blossom his contusions, no mass effect or shift. I'm pleased with his clinical progress.

## 2019-12-01 NOTE — Procedures (Signed)
Cortrak  Person Inserting Tube:  Clinton [name], RD Tube Type:  Cortrak - 43 inches Tube Location:  Right nare Initial Placement:  Stomach Secured by: Bridle Technique Used to Measure Tube Placement:  Documented cm marking at nare/ corner of mouth Cortrak Secured At:  70 cm Procedure Comments:  Cortrak Tube Team Note:  Consult received to place a Cortrak feeding tube.   No x-ray is required. RN may begin using tube.   If the tube becomes dislodged please keep the tube and contact the Cortrak team at [email or web address] ([email or web address]) for replacement.  If after hours and replacement cannot be delayed, place a NG tube and confirm placement with an abdominal x-ray.      Jarome Matin, MS, RD, LDN, CNSC Inpatient Clinical Dietitian RD pager # available in [place]  After hours/weekend pager # available in [employer]
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
## 2019-12-01 NOTE — Progress Notes (Signed)
Called to provide clinical update to father, Barbera Setters. All questions answered.   Jesusita Oka, MD General and [facility]

## 2019-12-01 NOTE — Progress Notes (Signed)
CDS initiated. Referral [phone]
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
## 2019-12-01 NOTE — Progress Notes (Signed)
Inpatient Rehab Admissions Coordinator Note:   Per PT/OT recommendations, pt was screened for CIR candidacy by Shann Medal, PT, DPT.  At this time we are recommending a CIR consult.  I will place an order per our protocol.  Please contact me with questions.   Shann Medal, PT, DPT [phone] 12/01/19 4:58 PM
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
## 2019-12-02 ENCOUNTER — Inpatient Hospital Stay (HOSPITAL_COMMUNITY): Payer: Medicaid Other

## 2019-12-02 LAB — GLUCOSE, CAPILLARY
Glucose-Capillary: 105 mg/dL — ABNORMAL HIGH (ref 70–99)
Glucose-Capillary: 108 mg/dL — ABNORMAL HIGH (ref 70–99)
Glucose-Capillary: 110 mg/dL — ABNORMAL HIGH (ref 70–99)
Glucose-Capillary: 111 mg/dL — ABNORMAL HIGH (ref 70–99)
Glucose-Capillary: 112 mg/dL — ABNORMAL HIGH (ref 70–99)
Glucose-Capillary: 143 mg/dL — ABNORMAL HIGH (ref 70–99)

## 2019-12-02 LAB — BASIC METABOLIC PANEL
Anion gap: 6 (ref 5–15)
BUN: 8 mg/dL (ref 6–20)
CO2: 29 mmol/L (ref 22–32)
Calcium: 7.8 mg/dL — ABNORMAL LOW (ref 8.9–10.3)
Chloride: 104 mmol/L (ref 98–111)
Creatinine, Ser: 0.58 mg/dL — ABNORMAL LOW (ref 0.61–1.24)
GFR calc Af Amer: >60 mL/min (ref >60)
GFR calc non Af Amer: >60 mL/min (ref >60)
Glucose, Bld: 141 mg/dL — ABNORMAL HIGH (ref 70–99)
Potassium: 3.7 mmol/L (ref 3.5–5.1)
Sodium: 139 mmol/L (ref 135–145)

## 2019-12-02 LAB — TRIGLYCERIDES: Triglycerides: 97 mg/dL (ref <150)

## 2019-12-02 LAB — CBC
HCT: 19 % — ABNORMAL LOW (ref 39.0–52.0)
Hemoglobin: 6.5 g/dL — CL (ref 13.0–17.0)
MCH: 32.2 pg (ref 26.0–34.0)
MCHC: 34.2 g/dL (ref 30.0–36.0)
MCV: 94.1 fL (ref 80.0–100.0)
Platelets: 114 10*3/uL — ABNORMAL LOW (ref 150–400)
RBC: 2.02 MIL/uL — ABNORMAL LOW (ref 4.22–5.81)
RDW: 12.6 % (ref 11.5–15.5)
WBC: 5.9 10*3/uL (ref 4.0–10.5)
nRBC: 0 % (ref 0.0–0.2)

## 2019-12-02 LAB — HEMOGLOBIN AND HEMATOCRIT, BLOOD
HCT: 21.4 % — ABNORMAL LOW (ref 39.0–52.0)
Hemoglobin: 7.1 g/dL — ABNORMAL LOW (ref 13.0–17.0)

## 2019-12-02 LAB — PREPARE RBC (CROSSMATCH)

## 2019-12-02 LAB — MAGNESIUM: Magnesium: 2.2 mg/dL (ref 1.7–2.4)

## 2019-12-02 LAB — ABO/RH: ABO/RH(D): A POS

## 2019-12-02 LAB — PHOSPHORUS: Phosphorus: 1.4 mg/dL — ABNORMAL LOW (ref 2.5–4.6)

## 2019-12-02 MED ORDER — SODIUM CHLORIDE 0.9% IV SOLUTION: Freq: Once | INTRAVENOUS | Status: AC

## 2019-12-02 MED ORDER — SODIUM PHOSPHATES 45 MMOLE/15ML IV SOLN
30.0000 mmol | Freq: Once | INTRAVENOUS | Status: AC
  Administered 2019-12-02: 30 mmol via INTRAVENOUS
  Filled 2019-12-02: qty 10

## 2019-12-02 MED ORDER — SODIUM CHLORIDE 0.9% IV SOLUTION: Freq: Once | INTRAVENOUS | Status: DC

## 2019-12-02 NOTE — Progress Notes (Signed)
Patient ID: Clinton [name], male   DOB: 12/07/1995, 24 y.o.   MRN: [id number] BP (!) 130/58   Pulse 87   Temp 99.7 F (37.6 C) (Axillary)   Resp 15   Ht 5\' 10"  (1.778 m)   Wt 83.2 kg   SpO2 100%   BMI 26.32 kg/m  Comatose, not localizing Win[place] with noxious stimuli +cough, +gag perrl Diaphoretic No real neurological changes No new recommendations
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
## 2019-12-02 NOTE — Progress Notes (Signed)
CRITICAL VALUE ALERT  Critical Value:  Hemoglobin 6.5  Date & Time Notied:  0445  Provider Notified: Rosendo Gros  Orders Received/Actions taken: Transfuse 1 unit RBC and repeat CBC after

## 2019-12-02 NOTE — Plan of Care (Signed)
  Problem: Education: Goal: Knowledge of the prescribed therapeutic regimen Outcome: Progressing Goal: Knowledge of disease or condition will improve Outcome: Progressing   Problem: Clinical Measurements: Goal: Neurologic status will improve Outcome: Progressing   Problem: Tissue Perfusion: Goal: Ability to maintain intracranial pressure will improve Outcome: Progressing   Problem: Respiratory: Goal: Will regain and/or maintain adequate ventilation Outcome: Progressing   Problem: Skin Integrity: Goal: Risk for impaired skin integrity will decrease Outcome: Progressing Goal: Demonstration of wound healing without infection will improve Outcome: Progressing   Problem: Psychosocial: Goal: Ability to verbalize positive feelings about self will improve Outcome: Progressing Goal: Ability to participate in self-care as condition permits will improve Outcome: Progressing Goal: Ability to identify appropriate support needs will improve Outcome: Progressing   Problem: Health Behavior/Discharge Planning: Goal: Ability to manage health-related needs will improve Outcome: Progressing   Problem: Nutritional: Goal: Risk of aspiration will decrease Outcome: Progressing Goal: Dietary intake will improve Outcome: Progressing   Problem: Communication: Goal: Ability to communicate needs accurately will improve Outcome: Progressing

## 2019-12-02 NOTE — Progress Notes (Signed)
Obtained Informed Consent from sister, Linward Natal for blood administration with Sharlet Salina RN over phone.

## 2019-12-02 NOTE — Progress Notes (Signed)
Patient began coughing and became diaphoretic with diaphoresis. Restarted Fentanyl at 12.58mL/hr with 42mcg bolus. Called respiratory to return to full support.
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
## 2019-12-03 LAB — BASIC METABOLIC PANEL
Anion gap: 8 (ref 5–15)
BUN: 8 mg/dL (ref 6–20)
CO2: 27 mmol/L (ref 22–32)
Calcium: 8.3 mg/dL — ABNORMAL LOW (ref 8.9–10.3)
Chloride: 106 mmol/L (ref 98–111)
Creatinine, Ser: 0.61 mg/dL (ref 0.61–1.24)
GFR calc Af Amer: >60 mL/min (ref >60)
GFR calc non Af Amer: >60 mL/min (ref >60)
Glucose, Bld: 111 mg/dL — ABNORMAL HIGH (ref 70–99)
Potassium: 3.8 mmol/L (ref 3.5–5.1)
Sodium: 141 mmol/L (ref 135–145)

## 2019-12-03 LAB — PHOSPHORUS: Phosphorus: 2.6 mg/dL (ref 2.5–4.6)

## 2019-12-03 LAB — CBC
HCT: 22.8 % — ABNORMAL LOW (ref 39.0–52.0)
Hemoglobin: 7.6 g/dL — ABNORMAL LOW (ref 13.0–17.0)
MCH: 31.9 pg (ref 26.0–34.0)
MCHC: 33.3 g/dL (ref 30.0–36.0)
MCV: 95.8 fL (ref 80.0–100.0)
Platelets: 149 10*3/uL — ABNORMAL LOW (ref 150–400)
RBC: 2.38 MIL/uL — ABNORMAL LOW (ref 4.22–5.81)
RDW: 14.4 % (ref 11.5–15.5)
WBC: 7.2 10*3/uL (ref 4.0–10.5)
nRBC: 0 % (ref 0.0–0.2)

## 2019-12-03 LAB — GLUCOSE, CAPILLARY
Glucose-Capillary: 105 mg/dL — ABNORMAL HIGH (ref 70–99)
Glucose-Capillary: 131 mg/dL — ABNORMAL HIGH (ref 70–99)
Glucose-Capillary: 127 mg/dL — ABNORMAL HIGH (ref 70–99)
Glucose-Capillary: 98 mg/dL (ref 70–99)
Glucose-Capillary: 104 mg/dL — ABNORMAL HIGH (ref 70–99)

## 2019-12-03 LAB — TRIGLYCERIDES: Triglycerides: 74 mg/dL (ref <150)

## 2019-12-03 LAB — MAGNESIUM: Magnesium: 2 mg/dL (ref 1.7–2.4)
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
## 2019-12-03 NOTE — Progress Notes (Signed)
Received night splint from [employer]. Changed dressing on patient' LLE and applied splint.

## 2019-12-03 NOTE — Plan of Care (Signed)
  Problem: Education: Goal: Knowledge of the prescribed therapeutic regimen Outcome: Progressing Goal: Knowledge of disease or condition will improve Outcome: Progressing   Problem: Clinical Measurements: Goal: Neurologic status will improve Outcome: Progressing   Problem: Tissue Perfusion: Goal: Ability to maintain intracranial pressure will improve Outcome: Progressing   Problem: Respiratory: Goal: Will regain and/or maintain adequate ventilation Outcome: Progressing   Problem: Skin Integrity: Goal: Risk for impaired skin integrity will decrease Outcome: Progressing Goal: Demonstration of wound healing without infection will improve Outcome: Progressing   Problem: Psychosocial: Goal: Ability to verbalize positive feelings about self will improve Outcome: Progressing Goal: Ability to participate in self-care as condition permits will improve Outcome: Progressing Goal: Ability to identify appropriate support needs will improve Outcome: Progressing   Problem: Health Behavior/Discharge Planning: Goal: Ability to manage health-related needs will improve Outcome: Progressing   Problem: Nutritional: Goal: Risk of aspiration will decrease Outcome: Progressing Goal: Dietary intake will improve Outcome: Progressing   Problem: Communication: Goal: Ability to communicate needs accurately will improve Outcome: Progressing

## 2019-12-03 NOTE — Progress Notes (Signed)
Orthopedic Tech Progress Note Patient Details:  DIAMONTAE AMIGON [birth year] [id number] [employer] was notified on status of night splint for pt., RN was notified on status of night splint for pt as well Patient ID: [name], male   DOB: 1996/07/29, 24 y.o.   MRN: [id number]   [name] 12/03/2019, 9:16 AM

## 2019-12-03 NOTE — Progress Notes (Signed)
Immediately, following administration of 1000 doses of depakene, miralax, colace, and pro-stat patient vomited violently. This nurse reduced tube feeds by 1/2 and administered zofran and waited approximately 1 hour prior to administering propanalol and readministed depakene.  Will continue to monitor.

## 2019-12-03 NOTE — Progress Notes (Signed)
RT NOTES: Tried patient on pressure support. RR of 8. Placed back on full support at this time.

## 2019-12-03 NOTE — Progress Notes (Signed)
Orthopedic Tech Progress Note Patient Details:  Clinton [name] Nov 08, 1995 [id number] Received call from [facility] vendor and informed them of current situation for the requested item, pt should be received night splint from vendor within the hour Patient ID: Clinton [name], male   DOB: Mar 06, 1996, 24 y.o.   MRN: [id number]   [name] 12/03/2019, 9:46 AM
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
## 2019-12-04 LAB — CBC
HCT: 25.1 % — ABNORMAL LOW (ref 39.0–52.0)
Hemoglobin: 8.1 g/dL — ABNORMAL LOW (ref 13.0–17.0)
MCH: 31.4 pg (ref 26.0–34.0)
MCHC: 32.3 g/dL (ref 30.0–36.0)
MCV: 97.3 fL (ref 80.0–100.0)
Platelets: 230 10*3/uL (ref 150–400)
RBC: 2.58 MIL/uL — ABNORMAL LOW (ref 4.22–5.81)
RDW: 14.1 % (ref 11.5–15.5)
WBC: 9.3 10*3/uL (ref 4.0–10.5)
nRBC: 0 % (ref 0.0–0.2)

## 2019-12-04 LAB — GLUCOSE, CAPILLARY
Glucose-Capillary: 100 mg/dL — ABNORMAL HIGH (ref 70–99)
Glucose-Capillary: 114 mg/dL — ABNORMAL HIGH (ref 70–99)
Glucose-Capillary: 127 mg/dL — ABNORMAL HIGH (ref 70–99)
Glucose-Capillary: 131 mg/dL — ABNORMAL HIGH (ref 70–99)
Glucose-Capillary: 139 mg/dL — ABNORMAL HIGH (ref 70–99)
Glucose-Capillary: 152 mg/dL — ABNORMAL HIGH (ref 70–99)

## 2019-12-04 LAB — BASIC METABOLIC PANEL
Anion gap: 7 (ref 5–15)
BUN: 12 mg/dL (ref 6–20)
CO2: 28 mmol/L (ref 22–32)
Calcium: 8.8 mg/dL — ABNORMAL LOW (ref 8.9–10.3)
Chloride: 108 mmol/L (ref 98–111)
Creatinine, Ser: 0.65 mg/dL (ref 0.61–1.24)
GFR calc Af Amer: >60 mL/min (ref >60)
GFR calc non Af Amer: >60 mL/min (ref >60)
Glucose, Bld: 127 mg/dL — ABNORMAL HIGH (ref 70–99)
Potassium: 4 mmol/L (ref 3.5–5.1)
Sodium: 143 mmol/L (ref 135–145)

## 2019-12-04 LAB — TYPE AND SCREEN
ABO/RH(D): A POS
Antibody Screen: NEGATIVE
Unit division: 0

## 2019-12-04 LAB — BPAM RBC
Blood Product Expiration Date: 202104292359
ISSUE DATE / TIME: 202104240733
Unit Type and Rh: 6200

## 2019-12-04 LAB — MAGNESIUM: Magnesium: 2 mg/dL (ref 1.7–2.4)

## 2019-12-04 LAB — TRIGLYCERIDES: Triglycerides: 79 mg/dL (ref <150)

## 2019-12-04 LAB — PHOSPHORUS: Phosphorus: 2.7 mg/dL (ref 2.5–4.6)

## 2019-12-04 MED ORDER — ENOXAPARIN SODIUM 30 MG/0.3ML ~~LOC~~ SOLN
30.0000 mg | Freq: Two times a day (BID) | SUBCUTANEOUS | Status: DC
  Administered 2019-12-04 – 2020-01-01 (×55): 30 mg via SUBCUTANEOUS
  Filled 2019-12-04 (×57): qty 0.3

## 2019-12-04 MED ORDER — SODIUM PHOSPHATES 45 MMOLE/15ML IV SOLN
20.0000 mmol | Freq: Once | INTRAVENOUS | Status: AC
  Administered 2019-12-04: 20 mmol via INTRAVENOUS
  Filled 2019-12-04: qty 6.67

## 2019-12-04 NOTE — Progress Notes (Signed)
Patient is currently vented.  We will continue to follow for rehabilitation needs

## 2019-12-04 NOTE — Progress Notes (Signed)
Orthopedic Tech Progress Note Patient Details:  Clinton [name] Dec 17, 1995 [id number] Called in order to [employer] for a NIGHT TIME SPLINT Patient ID: [name], male   DOB: 01-30-1996, 24 y.o.   MRN: [id number]   Janit Pagan 12/04/2019, 8:14 AM

## 2019-12-04 NOTE — Progress Notes (Signed)
Attempted to reach father, Barbera Setters, via phone to provide clinical update. No answer, left VM.   Jesusita Oka, MD General and [facility]
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
## 2019-12-04 NOTE — Progress Notes (Signed)
Patient ID: Clinton [name], male   DOB: August 24, 1995, 24 y.o.   MRN: [id number] Opens eyes to pain stim, remains intubated on fentanyl, some flexion on R, not much mvmt on L, PERRL. No new recs for severe CHI, his 3rd now.
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
## 2019-12-05 ENCOUNTER — Inpatient Hospital Stay (HOSPITAL_COMMUNITY): Payer: Medicaid Other

## 2019-12-05 LAB — BASIC METABOLIC PANEL
Anion gap: 10 (ref 5–15)
BUN: 16 mg/dL (ref 6–20)
CO2: 28 mmol/L (ref 22–32)
Calcium: 8.9 mg/dL (ref 8.9–10.3)
Chloride: 105 mmol/L (ref 98–111)
Creatinine, Ser: 0.59 mg/dL — ABNORMAL LOW (ref 0.61–1.24)
GFR calc Af Amer: >60 mL/min (ref >60)
GFR calc non Af Amer: >60 mL/min (ref >60)
Glucose, Bld: 110 mg/dL — ABNORMAL HIGH (ref 70–99)
Potassium: 4.3 mmol/L (ref 3.5–5.1)
Sodium: 143 mmol/L (ref 135–145)

## 2019-12-05 LAB — VALPROIC ACID LEVEL: Valproic Acid Lvl: 32 ug/mL — ABNORMAL LOW (ref 50.0–100.0)

## 2019-12-05 LAB — GLUCOSE, CAPILLARY
Glucose-Capillary: 108 mg/dL — ABNORMAL HIGH (ref 70–99)
Glucose-Capillary: 119 mg/dL — ABNORMAL HIGH (ref 70–99)
Glucose-Capillary: 119 mg/dL — ABNORMAL HIGH (ref 70–99)
Glucose-Capillary: 120 mg/dL — ABNORMAL HIGH (ref 70–99)
Glucose-Capillary: 122 mg/dL — ABNORMAL HIGH (ref 70–99)
Glucose-Capillary: 179 mg/dL — ABNORMAL HIGH (ref 70–99)
Glucose-Capillary: 97 mg/dL (ref 70–99)

## 2019-12-05 LAB — TRIGLYCERIDES: Triglycerides: 43 mg/dL (ref <150)

## 2019-12-05 LAB — PHOSPHORUS: Phosphorus: 4 mg/dL (ref 2.5–4.6)

## 2019-12-05 MED ORDER — OXYCODONE HCL 5 MG PO TABS
5.0000 mg | ORAL_TABLET | ORAL | Status: DC | PRN
  Administered 2019-12-05 – 2019-12-15 (×3): 10 mg
  Filled 2019-12-05 (×2): qty 2

## 2019-12-05 MED ORDER — SODIUM CHLORIDE 0.9 % IV SOLN: INTRAVENOUS | Status: DC
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
## 2019-12-05 NOTE — Progress Notes (Signed)
Inpatient Rehab Admissions Coordinator:   Note pt continues on the vent.  Will sign off for now.  Please re-consult if appropriate.   Shann Medal, PT, DPT Admissions Coordinator [phone] 12/05/19  2:50 PM

## 2019-12-05 NOTE — Progress Notes (Signed)
Patient ID: Clinton [name], male   DOB: [birth year], 24 y.o.   MRN: [id number] No change in exam, opens eyes to noxious stim, intubated and on fentanyl drip, flexes RUE to pain, little mvmt on L likely from large BG contusion. No new recs, following

## 2019-12-05 NOTE — Progress Notes (Signed)
SLP Cancellation Note  Patient Details Name: Clinton [name] MRN: [id number] DOB: 1995-09-29   Cancelled treatment:       Reason Eval/Treat Not Completed: Medical issues which prohibited therapy (intubated, sedated). SLP will continue to follow for cognitive evaluation.     Osie Bond., M.A. [facility] Pager [phone] Office [phone]  12/05/2019, 9:17 AM

## 2019-12-06 LAB — BASIC METABOLIC PANEL
Anion gap: 11 (ref 5–15)
BUN: 16 mg/dL (ref 6–20)
CO2: 28 mmol/L (ref 22–32)
Calcium: 9.1 mg/dL (ref 8.9–10.3)
Chloride: 105 mmol/L (ref 98–111)
Creatinine, Ser: 0.7 mg/dL (ref 0.61–1.24)
GFR calc Af Amer: >60 mL/min (ref >60)
GFR calc non Af Amer: >60 mL/min (ref >60)
Glucose, Bld: 129 mg/dL — ABNORMAL HIGH (ref 70–99)
Potassium: 4.2 mmol/L (ref 3.5–5.1)
Sodium: 144 mmol/L (ref 135–145)

## 2019-12-06 LAB — GLUCOSE, CAPILLARY
Glucose-Capillary: 103 mg/dL — ABNORMAL HIGH (ref 70–99)
Glucose-Capillary: 128 mg/dL — ABNORMAL HIGH (ref 70–99)
Glucose-Capillary: 120 mg/dL — ABNORMAL HIGH (ref 70–99)
Glucose-Capillary: 112 mg/dL — ABNORMAL HIGH (ref 70–99)
Glucose-Capillary: 124 mg/dL — ABNORMAL HIGH (ref 70–99)
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
## 2019-12-07 ENCOUNTER — Inpatient Hospital Stay (HOSPITAL_COMMUNITY): Payer: Medicaid Other

## 2019-12-07 LAB — GLUCOSE, CAPILLARY
Glucose-Capillary: 105 mg/dL — ABNORMAL HIGH (ref 70–99)
Glucose-Capillary: 109 mg/dL — ABNORMAL HIGH (ref 70–99)
Glucose-Capillary: 120 mg/dL — ABNORMAL HIGH (ref 70–99)
Glucose-Capillary: 123 mg/dL — ABNORMAL HIGH (ref 70–99)
Glucose-Capillary: 124 mg/dL — ABNORMAL HIGH (ref 70–99)
Glucose-Capillary: 127 mg/dL — ABNORMAL HIGH (ref 70–99)
Glucose-Capillary: 128 mg/dL — ABNORMAL HIGH (ref 70–99)

## 2019-12-07 LAB — CBC
HCT: 26.9 % — ABNORMAL LOW (ref 39.0–52.0)
Hemoglobin: 8.4 g/dL — ABNORMAL LOW (ref 13.0–17.0)
MCH: 30.2 pg (ref 26.0–34.0)
MCHC: 31.2 g/dL (ref 30.0–36.0)
MCV: 96.8 fL (ref 80.0–100.0)
Platelets: 425 10*3/uL — ABNORMAL HIGH (ref 150–400)
RBC: 2.78 MIL/uL — ABNORMAL LOW (ref 4.22–5.81)
RDW: 13.2 % (ref 11.5–15.5)
WBC: 9.2 10*3/uL (ref 4.0–10.5)
nRBC: 0.2 % (ref 0.0–0.2)

## 2019-12-07 MED ORDER — PIVOT 1.5 CAL PO LIQD
1000.0000 mL | ORAL | Status: DC
  Administered 2019-12-07 – 2019-12-24 (×20): 1000 mL
  Filled 2019-12-07 (×6): qty 1000
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
## 2019-12-08 LAB — CBC
HCT: 26.3 % — ABNORMAL LOW (ref 39.0–52.0)
Hemoglobin: 8.4 g/dL — ABNORMAL LOW (ref 13.0–17.0)
MCH: 30.3 pg (ref 26.0–34.0)
MCHC: 31.9 g/dL (ref 30.0–36.0)
MCV: 94.9 fL (ref 80.0–100.0)
Platelets: 504 10*3/uL — ABNORMAL HIGH (ref 150–400)
RBC: 2.77 MIL/uL — ABNORMAL LOW (ref 4.22–5.81)
RDW: 13.2 % (ref 11.5–15.5)
WBC: 13.4 10*3/uL — ABNORMAL HIGH (ref 4.0–10.5)
nRBC: 0.2 % (ref 0.0–0.2)

## 2019-12-08 LAB — BASIC METABOLIC PANEL
Anion gap: 10 (ref 5–15)
BUN: 19 mg/dL (ref 6–20)
CO2: 27 mmol/L (ref 22–32)
Calcium: 9.3 mg/dL (ref 8.9–10.3)
Chloride: 103 mmol/L (ref 98–111)
Creatinine, Ser: 0.61 mg/dL (ref 0.61–1.24)
GFR calc Af Amer: >60 mL/min (ref >60)
GFR calc non Af Amer: >60 mL/min (ref >60)
Glucose, Bld: 111 mg/dL — ABNORMAL HIGH (ref 70–99)
Potassium: 4.1 mmol/L (ref 3.5–5.1)
Sodium: 140 mmol/L (ref 135–145)

## 2019-12-08 LAB — GLUCOSE, CAPILLARY
Glucose-Capillary: 111 mg/dL — ABNORMAL HIGH (ref 70–99)
Glucose-Capillary: 115 mg/dL — ABNORMAL HIGH (ref 70–99)
Glucose-Capillary: 116 mg/dL — ABNORMAL HIGH (ref 70–99)
Glucose-Capillary: 118 mg/dL — ABNORMAL HIGH (ref 70–99)
Glucose-Capillary: 125 mg/dL — ABNORMAL HIGH (ref 70–99)
Glucose-Capillary: 131 mg/dL — ABNORMAL HIGH (ref 70–99)
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
## 2019-12-09 LAB — CBC
HCT: 27.2 % — ABNORMAL LOW (ref 39.0–52.0)
Hemoglobin: 8.6 g/dL — ABNORMAL LOW (ref 13.0–17.0)
MCH: 30.1 pg (ref 26.0–34.0)
MCHC: 31.6 g/dL (ref 30.0–36.0)
MCV: 95.1 fL (ref 80.0–100.0)
Platelets: 592 10*3/uL — ABNORMAL HIGH (ref 150–400)
RBC: 2.86 MIL/uL — ABNORMAL LOW (ref 4.22–5.81)
RDW: 13.3 % (ref 11.5–15.5)
WBC: 17.8 10*3/uL — ABNORMAL HIGH (ref 4.0–10.5)
nRBC: 0.2 % (ref 0.0–0.2)

## 2019-12-09 LAB — GLUCOSE, CAPILLARY
Glucose-Capillary: 110 mg/dL — ABNORMAL HIGH (ref 70–99)
Glucose-Capillary: 100 mg/dL — ABNORMAL HIGH (ref 70–99)
Glucose-Capillary: 108 mg/dL — ABNORMAL HIGH (ref 70–99)
Glucose-Capillary: 98 mg/dL (ref 70–99)
Glucose-Capillary: 131 mg/dL — ABNORMAL HIGH (ref 70–99)

## 2019-12-09 MED ORDER — ACETAMINOPHEN 160 MG/5ML PO SOLN
650.0000 mg | Freq: Four times a day (QID) | ORAL | Status: DC | PRN
  Administered 2019-12-09 – 2019-12-27 (×5): 650 mg
  Filled 2019-12-09 (×6): qty 20.3
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
## 2019-12-10 LAB — URINALYSIS, ROUTINE W REFLEX MICROSCOPIC
Bilirubin Urine: NEGATIVE
Glucose, UA: NEGATIVE mg/dL
Hgb urine dipstick: NEGATIVE
Ketones, ur: NEGATIVE mg/dL
Leukocytes,Ua: NEGATIVE
Nitrite: NEGATIVE
Protein, ur: NEGATIVE mg/dL
Specific Gravity, Urine: 1.027 (ref 1.005–1.030)
pH: 6 (ref 5.0–8.0)

## 2019-12-10 LAB — GLUCOSE, CAPILLARY
Glucose-Capillary: 101 mg/dL — ABNORMAL HIGH (ref 70–99)
Glucose-Capillary: 114 mg/dL — ABNORMAL HIGH (ref 70–99)
Glucose-Capillary: 115 mg/dL — ABNORMAL HIGH (ref 70–99)
Glucose-Capillary: 120 mg/dL — ABNORMAL HIGH (ref 70–99)
Glucose-Capillary: 121 mg/dL — ABNORMAL HIGH (ref 70–99)
Glucose-Capillary: 127 mg/dL — ABNORMAL HIGH (ref 70–99)
Glucose-Capillary: 138 mg/dL — ABNORMAL HIGH (ref 70–99)

## 2019-12-10 LAB — CBC
HCT: 29.1 % — ABNORMAL LOW (ref 39.0–52.0)
Hemoglobin: 9.4 g/dL — ABNORMAL LOW (ref 13.0–17.0)
MCH: 30.5 pg (ref 26.0–34.0)
MCHC: 32.3 g/dL (ref 30.0–36.0)
MCV: 94.5 fL (ref 80.0–100.0)
Platelets: 579 10*3/uL — ABNORMAL HIGH (ref 150–400)
RBC: 3.08 MIL/uL — ABNORMAL LOW (ref 4.22–5.81)
RDW: 13.4 % (ref 11.5–15.5)
WBC: 19.9 10*3/uL — ABNORMAL HIGH (ref 4.0–10.5)
nRBC: 0.1 % (ref 0.0–0.2)

## 2019-12-10 LAB — BASIC METABOLIC PANEL
Anion gap: 10 (ref 5–15)
BUN: 21 mg/dL — ABNORMAL HIGH (ref 6–20)
CO2: 29 mmol/L (ref 22–32)
Calcium: 9.3 mg/dL (ref 8.9–10.3)
Chloride: 105 mmol/L (ref 98–111)
Creatinine, Ser: 0.73 mg/dL (ref 0.61–1.24)
GFR calc Af Amer: >60 mL/min (ref >60)
GFR calc non Af Amer: >60 mL/min (ref >60)
Glucose, Bld: 143 mg/dL — ABNORMAL HIGH (ref 70–99)
Potassium: 4 mmol/L (ref 3.5–5.1)
Sodium: 144 mmol/L (ref 135–145)

## 2019-12-10 MED ORDER — SODIUM CHLORIDE 0.9 % IV SOLN
2.0000 g | Freq: Three times a day (TID) | INTRAVENOUS | Status: DC
  Administered 2019-12-10 – 2019-12-12 (×7): 2 g via INTRAVENOUS
  Filled 2019-12-10 (×9): qty 2

## 2019-12-10 MED ORDER — METOPROLOL TARTRATE 5 MG/5ML IV SOLN
5.0000 mg | Freq: Four times a day (QID) | INTRAVENOUS | Status: DC | PRN
  Administered 2019-12-13 – 2019-12-23 (×12): 5 mg via INTRAVENOUS
  Filled 2019-12-10 (×14): qty 5

## 2019-12-10 MED ORDER — SODIUM CHLORIDE 0.9 % IV SOLN
INTRAVENOUS | Status: DC | PRN
  Administered 2019-12-10: 500 mL via INTRAVENOUS
  Administered 2019-12-24: 1000 mL via INTRAVENOUS
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
## 2019-12-11 DIAGNOSIS — Z515 Encounter for palliative care: Secondary | ICD-10-CM

## 2019-12-11 DIAGNOSIS — Z7189 Other specified counseling: Secondary | ICD-10-CM

## 2019-12-11 DIAGNOSIS — S06366A Traumatic hemorrhage of cerebrum, unspecified, with loss of consciousness greater than 24 hours without return to pre-existing conscious level with patient surviving, initial encounter: Secondary | ICD-10-CM

## 2019-12-11 DIAGNOSIS — I609 Nontraumatic subarachnoid hemorrhage, unspecified: Secondary | ICD-10-CM

## 2019-12-11 LAB — BLOOD CULTURE ID PANEL (REFLEXED)

## 2019-12-11 LAB — GLUCOSE, CAPILLARY
Glucose-Capillary: 102 mg/dL — ABNORMAL HIGH (ref 70–99)
Glucose-Capillary: 106 mg/dL — ABNORMAL HIGH (ref 70–99)
Glucose-Capillary: 106 mg/dL — ABNORMAL HIGH (ref 70–99)
Glucose-Capillary: 107 mg/dL — ABNORMAL HIGH (ref 70–99)
Glucose-Capillary: 108 mg/dL — ABNORMAL HIGH (ref 70–99)
Glucose-Capillary: 114 mg/dL — ABNORMAL HIGH (ref 70–99)

## 2019-12-11 LAB — BASIC METABOLIC PANEL
Anion gap: 9 (ref 5–15)
BUN: 23 mg/dL — ABNORMAL HIGH (ref 6–20)
CO2: 32 mmol/L (ref 22–32)
Calcium: 9.2 mg/dL (ref 8.9–10.3)
Chloride: 105 mmol/L (ref 98–111)
Creatinine, Ser: 0.78 mg/dL (ref 0.61–1.24)
GFR calc Af Amer: >60 mL/min (ref >60)
GFR calc non Af Amer: >60 mL/min (ref >60)
Glucose, Bld: 134 mg/dL — ABNORMAL HIGH (ref 70–99)
Potassium: 4.1 mmol/L (ref 3.5–5.1)
Sodium: 146 mmol/L — ABNORMAL HIGH (ref 135–145)

## 2019-12-11 LAB — CBC
HCT: 26.2 % — ABNORMAL LOW (ref 39.0–52.0)
Hemoglobin: 8.2 g/dL — ABNORMAL LOW (ref 13.0–17.0)
MCH: 30.1 pg (ref 26.0–34.0)
MCHC: 31.3 g/dL (ref 30.0–36.0)
MCV: 96.3 fL (ref 80.0–100.0)
Platelets: 555 10*3/uL — ABNORMAL HIGH (ref 150–400)
RBC: 2.72 MIL/uL — ABNORMAL LOW (ref 4.22–5.81)
RDW: 13.6 % (ref 11.5–15.5)
WBC: 14.2 10*3/uL — ABNORMAL HIGH (ref 4.0–10.5)
nRBC: 0.1 % (ref 0.0–0.2)
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
## 2019-12-11 NOTE — Progress Notes (Signed)
SLP Cancellation Note  Patient Details Name: Clinton [name] MRN: [id number] DOB: Jan 10, 1996   Cancelled treatment:       Reason Eval/Treat Not Completed: Medical issues which prohibited therapy; remains on the vent. MD discussing Crystal with pt's family. Will f/u as able.    Osie Bond., M.A. [place] Acute Rehabilitation Services Pager [phone] Office [phone]  12/11/2019, 10:58 AM

## 2019-12-11 NOTE — Progress Notes (Signed)
Palliative-   Thank you for this consult. Chart reviewed. Discussed with Dr. Bobbye Morton. No family at bedside. Called Petra Kuba in efforts to arrange Palliative meeting with him and patient's sister. He stated he would call me back with a time to meet today.  Mariana Kaufman, AGNP-C Palliative Medicine  Please call Palliative Medicine team phone with any questions [phone]. For individual providers please see [facility]ION.  No charge

## 2019-12-12 DIAGNOSIS — S0990XD Unspecified injury of head, subsequent encounter: Secondary | ICD-10-CM | POA: Diagnosis not present

## 2019-12-12 DIAGNOSIS — B958 Unspecified staphylococcus as the cause of diseases classified elsewhere: Secondary | ICD-10-CM | POA: Diagnosis not present

## 2019-12-12 DIAGNOSIS — Z930 Tracheostomy status: Secondary | ICD-10-CM

## 2019-12-12 DIAGNOSIS — Z9911 Dependence on respirator [ventilator] status: Secondary | ICD-10-CM

## 2019-12-12 DIAGNOSIS — R7881 Bacteremia: Secondary | ICD-10-CM

## 2019-12-12 DIAGNOSIS — I629 Nontraumatic intracranial hemorrhage, unspecified: Secondary | ICD-10-CM

## 2019-12-12 DIAGNOSIS — Z66 Do not resuscitate: Secondary | ICD-10-CM

## 2019-12-12 LAB — BASIC METABOLIC PANEL
Anion gap: 12 (ref 5–15)
BUN: 24 mg/dL — ABNORMAL HIGH (ref 6–20)
CO2: 30 mmol/L (ref 22–32)
Calcium: 9.6 mg/dL (ref 8.9–10.3)
Chloride: 106 mmol/L (ref 98–111)
Creatinine, Ser: 0.61 mg/dL (ref 0.61–1.24)
GFR calc Af Amer: >60 mL/min (ref >60)
GFR calc non Af Amer: >60 mL/min (ref >60)
Glucose, Bld: 102 mg/dL — ABNORMAL HIGH (ref 70–99)
Potassium: 3.9 mmol/L (ref 3.5–5.1)
Sodium: 148 mmol/L — ABNORMAL HIGH (ref 135–145)

## 2019-12-12 LAB — CBC
HCT: 26.1 % — ABNORMAL LOW (ref 39.0–52.0)
Hemoglobin: 8.1 g/dL — ABNORMAL LOW (ref 13.0–17.0)
MCH: 30.5 pg (ref 26.0–34.0)
MCHC: 31 g/dL (ref 30.0–36.0)
MCV: 98.1 fL (ref 80.0–100.0)
Platelets: 555 10*3/uL — ABNORMAL HIGH (ref 150–400)
RBC: 2.66 MIL/uL — ABNORMAL LOW (ref 4.22–5.81)
RDW: 13.7 % (ref 11.5–15.5)
WBC: 12.2 10*3/uL — ABNORMAL HIGH (ref 4.0–10.5)
nRBC: 0 % (ref 0.0–0.2)

## 2019-12-12 LAB — GLUCOSE, CAPILLARY
Glucose-Capillary: 102 mg/dL — ABNORMAL HIGH (ref 70–99)
Glucose-Capillary: 104 mg/dL — ABNORMAL HIGH (ref 70–99)
Glucose-Capillary: 106 mg/dL — ABNORMAL HIGH (ref 70–99)
Glucose-Capillary: 106 mg/dL — ABNORMAL HIGH (ref 70–99)
Glucose-Capillary: 87 mg/dL (ref 70–99)
Glucose-Capillary: 87 mg/dL (ref 70–99)

## 2019-12-12 LAB — CULTURE, RESPIRATORY W GRAM STAIN
Culture: NORMAL
Gram Stain: NONE SEEN

## 2019-12-12 MED ORDER — VANCOMYCIN HCL 1250 MG/250ML IV SOLN
1250.0000 mg | Freq: Three times a day (TID) | INTRAVENOUS | Status: DC
  Administered 2019-12-12 – 2019-12-14 (×5): 1250 mg via INTRAVENOUS
  Filled 2019-12-12 (×6): qty 250

## 2019-12-12 MED ORDER — LEVETIRACETAM 100 MG/ML PO SOLN
1000.0000 mg | Freq: Two times a day (BID) | ORAL | Status: DC
  Administered 2019-12-12 – 2020-01-01 (×41): 1000 mg
  Filled 2019-12-12 (×41): qty 10
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
## 2019-12-12 NOTE — Progress Notes (Signed)
Pharmacy Antibiotic Note  Clinton [name] is a 24 y.o. male admitted on 11/29/2019 with bacteremia.  Pharmacy has been consulted for Vancomycin dosing.  Vancomycin 1250 mg IV Q 8 hrs. Goal AUC 400-550. Expected AUC: 491 SCr used: 0.8  Plan: Vancomycin 1250 mg IV q8hr Monitor renal function, clinical status and vanc levels as indicated  Height: 5\' 10"  (177.8 cm) Weight: 83.2 kg (183 lb 6.8 oz) IBW/kg (Calculated) : 73  Temp (24hrs), Avg:98.7 F (37.1 C), Min:98.1 F (36.7 C), Max:100 F (37.8 C)  Recent Labs  Lab 12/06/19 0226 12/07/19 0255 12/08/19 0217 12/09/19 0215 12/10/19 0229 12/10/19 1336 12/11/19 0608 12/12/19 0618  WBC  --    < > 13.4* 17.8* 19.9*  --  14.2* 12.2*  CREATININE 0.70  --  0.61  --   --  0.73 0.78 0.61   < > = values in this interval not displayed.    Estimated Creatinine Clearance: 148.3 mL/min (by C-G formula based on SCr of 0.61 mg/dL).    Allergies  Allergen Reactions  . Morphine And Related Anaphylaxis, Hives, Itching and Swelling  . Seroquel [Quetiapine] Other (See Comments)    Per patient's other chart in Epic: "Suicidal on this med in past - mother wants to make sure this medication is never given again. 09/27/2013"    . Latex Rash    Antimicrobials this admission: Vanc 5/4 >>  Cefep 5/2 >>   Thank you for allowing pharmacy to be a part of this patient's care.  Alanda Slim, PharmD, [facility] Clinical Pharmacist Please see AMION for all Pharmacists' Contact Phone Numbers 12/12/2019, 1:50 PM

## 2019-12-13 DIAGNOSIS — Z66 Do not resuscitate: Secondary | ICD-10-CM

## 2019-12-13 DIAGNOSIS — J9690 Respiratory failure, unspecified, unspecified whether with hypoxia or hypercapnia: Secondary | ICD-10-CM

## 2019-12-13 DIAGNOSIS — S82832A Other fracture of upper and lower end of left fibula, initial encounter for closed fracture: Secondary | ICD-10-CM

## 2019-12-13 DIAGNOSIS — B958 Unspecified staphylococcus as the cause of diseases classified elsewhere: Secondary | ICD-10-CM | POA: Diagnosis not present

## 2019-12-13 DIAGNOSIS — R7881 Bacteremia: Secondary | ICD-10-CM | POA: Diagnosis not present

## 2019-12-13 DIAGNOSIS — J9600 Acute respiratory failure, unspecified whether with hypoxia or hypercapnia: Secondary | ICD-10-CM

## 2019-12-13 DIAGNOSIS — Z9911 Dependence on respirator [ventilator] status: Secondary | ICD-10-CM | POA: Diagnosis not present

## 2019-12-13 DIAGNOSIS — S0990XD Unspecified injury of head, subsequent encounter: Secondary | ICD-10-CM | POA: Diagnosis not present

## 2019-12-13 LAB — CBC
HCT: 26.6 % — ABNORMAL LOW (ref 39.0–52.0)
Hemoglobin: 8.2 g/dL — ABNORMAL LOW (ref 13.0–17.0)
MCH: 30.3 pg (ref 26.0–34.0)
MCHC: 30.8 g/dL (ref 30.0–36.0)
MCV: 98.2 fL (ref 80.0–100.0)
Platelets: 580 10*3/uL — ABNORMAL HIGH (ref 150–400)
RBC: 2.71 MIL/uL — ABNORMAL LOW (ref 4.22–5.81)
RDW: 13.9 % (ref 11.5–15.5)
WBC: 10.5 10*3/uL (ref 4.0–10.5)
nRBC: 0 % (ref 0.0–0.2)

## 2019-12-13 LAB — BASIC METABOLIC PANEL
Anion gap: 11 (ref 5–15)
BUN: 25 mg/dL — ABNORMAL HIGH (ref 6–20)
CO2: 28 mmol/L (ref 22–32)
Calcium: 9.4 mg/dL (ref 8.9–10.3)
Chloride: 108 mmol/L (ref 98–111)
Creatinine, Ser: 0.64 mg/dL (ref 0.61–1.24)
GFR calc Af Amer: >60 mL/min (ref >60)
GFR calc non Af Amer: >60 mL/min (ref >60)
Glucose, Bld: 129 mg/dL — ABNORMAL HIGH (ref 70–99)
Potassium: 3.9 mmol/L (ref 3.5–5.1)
Sodium: 147 mmol/L — ABNORMAL HIGH (ref 135–145)

## 2019-12-13 LAB — GLUCOSE, CAPILLARY
Glucose-Capillary: 103 mg/dL — ABNORMAL HIGH (ref 70–99)
Glucose-Capillary: 96 mg/dL (ref 70–99)
Glucose-Capillary: 119 mg/dL — ABNORMAL HIGH (ref 70–99)
Glucose-Capillary: 108 mg/dL — ABNORMAL HIGH (ref 70–99)
Glucose-Capillary: 85 mg/dL (ref 70–99)
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
## 2019-12-13 NOTE — TOC Progression Note (Signed)
Transition of Care [facility]) - Progression Note    Patient Details  Name: Clinton [name] MRN: [id number] Date of Birth: 06-Jan-1996  [facility]) CM/SW Contact  [name], RN Phone Number: 12/13/2019, 9:54 AM  Clinical Narrative:   Per MD, family has now decided to trach/PEG with full aggressive measures.  They have questions as to what facility pt would go to when stable for dc.  I met with sister, [name], at bedside to discuss discharge planning.  I explained to her that placement for patient will be difficult due to new tracheostomy, brain injury, and Medicaid as only payor, and may take a while to find a bed for him.  She states that family wants him as close to them as possible.  I explained that this may not be possible due to the care that he may require, but we will do our best to get him as close to family as possible.  She appreciated my visit.  I will provide updates to staff and family as they are available.                                                         Social Determinants of Health (SDOH) Interventions    Readmission Risk Interventions No flowsheet data found.  [name], RN, BSN  Trauma/Neuro ICU Case Manager [phone]
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
## 2019-12-14 ENCOUNTER — Inpatient Hospital Stay (HOSPITAL_COMMUNITY): Payer: Medicaid Other

## 2019-12-14 ENCOUNTER — Encounter (HOSPITAL_COMMUNITY): Admission: EM | Disposition: A | Discharge status: Skilled Nursing Facility | Payer: Self-pay | Source: Home / Self Care

## 2019-12-14 ENCOUNTER — Inpatient Hospital Stay (HOSPITAL_COMMUNITY): Payer: Medicaid Other | Admitting: Certified Registered Nurse Anesthetist

## 2019-12-14 DIAGNOSIS — Z66 Do not resuscitate: Secondary | ICD-10-CM

## 2019-12-14 DIAGNOSIS — R7889 Finding of other specified substances, not normally found in blood: Secondary | ICD-10-CM | POA: Diagnosis not present

## 2019-12-14 DIAGNOSIS — S82262A Displaced segmental fracture of shaft of left tibia, initial encounter for closed fracture: Secondary | ICD-10-CM

## 2019-12-14 DIAGNOSIS — S06369A Traumatic hemorrhage of cerebrum, unspecified, with loss of consciousness of unspecified duration, initial encounter: Secondary | ICD-10-CM | POA: Diagnosis not present

## 2019-12-14 DIAGNOSIS — J9690 Respiratory failure, unspecified, unspecified whether with hypoxia or hypercapnia: Secondary | ICD-10-CM

## 2019-12-14 DIAGNOSIS — S72099A Other fracture of head and neck of unspecified femur, initial encounter for closed fracture: Secondary | ICD-10-CM | POA: Diagnosis not present

## 2019-12-14 HISTORY — PX: TRACHEOSTOMY TUBE PLACEMENT: SHX814

## 2019-12-14 HISTORY — PX: PEG PLACEMENT: SHX5437

## 2019-12-14 HISTORY — PX: ESOPHAGOGASTRODUODENOSCOPY: SHX5428

## 2019-12-14 LAB — GLUCOSE, CAPILLARY
Glucose-Capillary: 101 mg/dL — ABNORMAL HIGH (ref 70–99)
Glucose-Capillary: 109 mg/dL — ABNORMAL HIGH (ref 70–99)
Glucose-Capillary: 112 mg/dL — ABNORMAL HIGH (ref 70–99)
Glucose-Capillary: 121 mg/dL — ABNORMAL HIGH (ref 70–99)
Glucose-Capillary: 84 mg/dL (ref 70–99)
Glucose-Capillary: 86 mg/dL (ref 70–99)
Glucose-Capillary: 88 mg/dL (ref 70–99)

## 2019-12-14 LAB — BASIC METABOLIC PANEL
Anion gap: 10 (ref 5–15)
BUN: 25 mg/dL — ABNORMAL HIGH (ref 6–20)
CO2: 28 mmol/L (ref 22–32)
Calcium: 9.5 mg/dL (ref 8.9–10.3)
Chloride: 109 mmol/L (ref 98–111)
Creatinine, Ser: 0.65 mg/dL (ref 0.61–1.24)
GFR calc Af Amer: >60 mL/min (ref >60)
GFR calc non Af Amer: >60 mL/min (ref >60)
Glucose, Bld: 94 mg/dL (ref 70–99)
Potassium: 3.8 mmol/L (ref 3.5–5.1)
Sodium: 147 mmol/L — ABNORMAL HIGH (ref 135–145)

## 2019-12-14 LAB — CBC
HCT: 26.8 % — ABNORMAL LOW (ref 39.0–52.0)
Hemoglobin: 8.2 g/dL — ABNORMAL LOW (ref 13.0–17.0)
MCH: 29.9 pg (ref 26.0–34.0)
MCHC: 30.6 g/dL (ref 30.0–36.0)
MCV: 97.8 fL (ref 80.0–100.0)
Platelets: 600 10*3/uL — ABNORMAL HIGH (ref 150–400)
RBC: 2.74 MIL/uL — ABNORMAL LOW (ref 4.22–5.81)
RDW: 14.1 % (ref 11.5–15.5)
WBC: 10.7 10*3/uL — ABNORMAL HIGH (ref 4.0–10.5)
nRBC: 0 % (ref 0.0–0.2)

## 2019-12-14 LAB — PHOSPHORUS: Phosphorus: 4.1 mg/dL (ref 2.5–4.6)

## 2019-12-14 LAB — CULTURE, BLOOD (ROUTINE X 2): Special Requests: ADEQUATE

## 2019-12-14 LAB — TYPE AND SCREEN
ABO/RH(D): A POS
Antibody Screen: NEGATIVE

## 2019-12-14 LAB — MAGNESIUM: Magnesium: 2 mg/dL (ref 1.7–2.4)

## 2019-12-14 LAB — PROTIME-INR
INR: 1.1 (ref 0.8–1.2)
Prothrombin Time: 13.7 seconds (ref 11.4–15.2)

## 2019-12-14 SURGERY — CREATION, TRACHEOSTOMY
Anesthesia: General | Site: Neck

## 2019-12-14 MED ORDER — ROCURONIUM BROMIDE 10 MG/ML (PF) SYRINGE
PREFILLED_SYRINGE | INTRAVENOUS | Status: DC | PRN
  Administered 2019-12-14: 50 mg via INTRAVENOUS

## 2019-12-14 MED ORDER — ONDANSETRON HCL 4 MG/2ML IJ SOLN
INTRAMUSCULAR | Status: DC | PRN
  Administered 2019-12-14: 4 mg via INTRAVENOUS

## 2019-12-14 MED ORDER — DEXAMETHASONE SODIUM PHOSPHATE 10 MG/ML IJ SOLN
INTRAMUSCULAR | Status: DC | PRN
  Administered 2019-12-14: 5 mg via INTRAVENOUS

## 2019-12-14 MED ORDER — ROCURONIUM BROMIDE 10 MG/ML (PF) SYRINGE
PREFILLED_SYRINGE | INTRAVENOUS | Status: AC
  Filled 2019-12-14: qty 10

## 2019-12-14 MED ORDER — PHENYLEPHRINE 40 MCG/ML (10ML) SYRINGE FOR IV PUSH (FOR BLOOD PRESSURE SUPPORT)
PREFILLED_SYRINGE | INTRAVENOUS | Status: AC
  Filled 2019-12-14: qty 10

## 2019-12-14 MED ORDER — 0.9 % SODIUM CHLORIDE (POUR BTL) OPTIME
TOPICAL | Status: DC | PRN
  Administered 2019-12-14: 1000 mL

## 2019-12-14 MED ORDER — ONDANSETRON HCL 4 MG/2ML IJ SOLN
INTRAMUSCULAR | Status: AC
  Filled 2019-12-14: qty 2

## 2019-12-14 MED ORDER — LIDOCAINE-EPINEPHRINE 1.5 %-1:200,000 OPTIME - NO CHARGE
INTRAMUSCULAR | Status: DC | PRN
  Administered 2019-12-14: 4 mL

## 2019-12-14 MED ORDER — PHENYLEPHRINE 40 MCG/ML (10ML) SYRINGE FOR IV PUSH (FOR BLOOD PRESSURE SUPPORT)
PREFILLED_SYRINGE | INTRAVENOUS | Status: DC | PRN
  Administered 2019-12-14 (×3): 80 ug via INTRAVENOUS

## 2019-12-14 MED ORDER — PROPOFOL 10 MG/ML IV BOLUS
INTRAVENOUS | Status: DC | PRN
  Administered 2019-12-14: 50 mg via INTRAVENOUS

## 2019-12-14 MED ORDER — DEXAMETHASONE SODIUM PHOSPHATE 10 MG/ML IJ SOLN
INTRAMUSCULAR | Status: AC
  Filled 2019-12-14: qty 1

## 2019-12-14 MED ORDER — MIDAZOLAM HCL 2 MG/2ML IJ SOLN
INTRAMUSCULAR | Status: DC | PRN
  Administered 2019-12-14: 2 mg via INTRAVENOUS

## 2019-12-14 MED ORDER — MIDAZOLAM HCL 2 MG/2ML IJ SOLN
INTRAMUSCULAR | Status: AC
  Filled 2019-12-14: qty 2

## 2019-12-14 MED ORDER — LACTATED RINGERS IV SOLN: INTRAVENOUS | Status: DC | PRN

## 2019-12-14 SURGICAL SUPPLY — 38 items
BLADE CLIPPER SURG (BLADE) IMPLANT
CANISTER SUCT 3000ML PPV (MISCELLANEOUS) ×5 IMPLANT
COVER SURGICAL LIGHT HANDLE (MISCELLANEOUS) ×5 IMPLANT
COVER WAND RF STERILE (DRAPES) ×3 IMPLANT
DRAPE UTILITY XL STRL (DRAPES) ×5 IMPLANT
ELECT CAUTERY BLADE 6.4 (BLADE) ×5 IMPLANT
ELECT REM PT RETURN 9FT ADLT (ELECTROSURGICAL) ×5
ELECTRODE REM PT RTRN 9FT ADLT (ELECTROSURGICAL) ×3 IMPLANT
GAUZE 4X4 16PLY RFD (DISPOSABLE) ×5 IMPLANT
GAUZE SPONGE 2X2 8PLY STRL LF (GAUZE/BANDAGES/DRESSINGS) IMPLANT
GLOVE BIO SURGEON STRL SZ8 (GLOVE) ×5 IMPLANT
GLOVE BIOGEL PI IND STRL 6 (GLOVE) IMPLANT
GLOVE BIOGEL PI IND STRL 8 (GLOVE) ×3 IMPLANT
GLOVE BIOGEL PI INDICATOR 6 (GLOVE) ×2
GLOVE BIOGEL PI INDICATOR 8 (GLOVE) ×2
GLOVE SURG SS PI 6.0 STRL IVOR (GLOVE) ×4 IMPLANT
GLOVE SURG SS PI 8.0 STRL IVOR (GLOVE) ×4 IMPLANT
GOWN STRL REUS W/ TWL LRG LVL3 (GOWN DISPOSABLE) ×3 IMPLANT
GOWN STRL REUS W/ TWL XL LVL3 (GOWN DISPOSABLE) ×3 IMPLANT
GOWN STRL REUS W/TWL LRG LVL3 (GOWN DISPOSABLE) ×10
GOWN STRL REUS W/TWL XL LVL3 (GOWN DISPOSABLE) ×5
INTRODUCER TRACH BLUE RHINO 6F (TUBING) ×2 IMPLANT
INTRODUCER TRACH BLUE RHINO 8F (TUBING) IMPLANT
KIT BASIN OR (CUSTOM PROCEDURE TRAY) ×5 IMPLANT
KIT TURNOVER KIT B (KITS) ×5 IMPLANT
NS IRRIG 1000ML POUR BTL (IV SOLUTION) ×5 IMPLANT
PACK EENT II TURBAN DRAPE (CUSTOM PROCEDURE TRAY) ×5 IMPLANT
PAD ARMBOARD 7.5X6 YLW CONV (MISCELLANEOUS) ×8 IMPLANT
PENCIL BUTTON HOLSTER BLD 10FT (ELECTRODE) ×3 IMPLANT
SPONGE DRAIN TRACH 4X4 STRL 2S (GAUZE/BANDAGES/DRESSINGS) ×2 IMPLANT
SPONGE GAUZE 2X2 STER 10/PKG (GAUZE/BANDAGES/DRESSINGS) ×2
SPONGE INTESTINAL PEANUT (DISPOSABLE) ×5 IMPLANT
SUT VICRYL AB 3 0 TIES (SUTURE) ×5 IMPLANT
SYR 20ML LL LF (SYRINGE) ×3 IMPLANT
TOWEL GREEN STERILE (TOWEL DISPOSABLE) ×5 IMPLANT
TOWEL GREEN STERILE FF (TOWEL DISPOSABLE) ×5 IMPLANT
TUBE CONNECTING 12'X1/4 (SUCTIONS) ×1
TUBE CONNECTING 12X1/4 (SUCTIONS) ×4 IMPLANT

## 2019-12-14 NOTE — Progress Notes (Signed)
Patient ID: Clinton [name], male   DOB: 1996/06/10, 24 y.o.   MRN: [id number] I spoke with his sister and step father by phone earlier and now at the bedside. They requested discontinuing DNR. I changed the order. Patient examined and I agree with the assessment and plan  Georganna Skeans, MD, MPH, FACS Please use [email or web address] to contact on call provider 12/14/2019 1:36 PM
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
## 2019-12-14 NOTE — Progress Notes (Signed)
SLP Cancellation Note  Patient Details Name: Clinton [name] MRN: [id number] DOB: 1996-05-22   Cancelled treatment:       Reason Eval/Treat Not Completed: Medical issues which prohibited therapy. SLP continues to follow - note that plan is to go to OR today for trach/PEG. Will f/u as able.    Osie Bond., M.A. [facility] Pager [phone] Office [phone]  12/14/2019, 8:27 AM
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
## 2019-12-14 NOTE — Op Note (Signed)
  12/14/2019  10:57 AM  PATIENT:  Clinton [name]  23 y.o. male  PRE-OPERATIVE DIAGNOSIS:  respiratory failure  POST-OPERATIVE DIAGNOSIS:  respiratory failure  PROCEDURE:  Procedure(s): TRACHEOSTOMY #6 SHILEY   SURGEON:  Surgeon(s): Georganna Skeans, MD  ASSISTANTS: Melina Modena, PA-C   ANESTHESIA:   local and general  EBL:  Total I/O In: 57.2 [I.V.:57.2] Out: 375 [Urine:375]  BLOOD ADMINISTERED:none  DRAINS: none   SPECIMEN:  No Specimen  DISPOSITION OF SPECIMEN:  N/A  COUNTS:  YES  DICTATION: .Dragon Dictation Patient was brought directly from the ICU directly to the operating room on the ventilator.  Informed consent had been obtained from his family.  He is currently on IV antibiotics.  He was positioned appropriately and general anesthesia was administered by the anesthesia staff.  His anterior neck was prepped and draped in a sterile fashion.  We did a timeout procedure.  Local anesthetic was injected over the anterior portion of the trachea.  A vertical incision was made about 2 cm cephalad to the sternal notch.  Subcutaneous tissues were dissected down through some old scar tissue.  The strap muscles were split along the midline.  Cautery was used to get good hemostasis.  The anterior trachea was exposed which was a little bit calcified from his previous tracheostomy.  I then palpated the trachea while the endotracheal tube was retracted to just above the second and third tracheal ring.  I inserted the Angiocath between the second and third tracheal ring followed by guidewire.  The small blue Rhino dilator was then placed.  Next, the large Blue Rhino dilator was placed.  Finally, a #6 Shiley tracheostomy was placed over a 24 [place] dilator.  The dilator was removed and the inner cannula was placed.  Cuff was inflated and the trach was hooked up to the ventilator circuit.  Excellent volume returns were obtained.  A drain sponge was placed from tracheostomy and it was secured to  the skin with Prolene sutures.  A Velcro trach tie was applied.  This completed this portion of the procedure.  Counts were all correct.  No complications.  He remained in the operating room to undergo his PEG tube placement.  That is documented within the [facility] EMR. PATIENT DISPOSITION:  remains in OR for PEG then back to Neurotrauma ICU   Delay start of Pharmacological VTE agent (>24hrs) due to surgical blood loss or risk of bleeding:  no  Georganna Skeans, MD, MPH, FACS Pager: [phone]  5/6/202110:57 AM

## 2019-12-14 NOTE — Transfer of Care (Signed)
Immediate Anesthesia Transfer of Care Note  Patient: Clinton [name]  Procedure(s) Performed: OPEN TRACHEOSTOMY (N/A Neck) ESOPHAGOGASTRODUODENOSCOPY (EGD) (N/A Mouth) PERCUTANEOUS ENDOSCOPIC GASTROSTOMY (PEG) PLACEMENT (N/A Abdomen)  Patient Location: ICU  Anesthesia Type:General  Level of Consciousness: sedated and patient cooperative  Airway & Oxygen Therapy: Patient Spontanous Breathing and Patient remains intubated per anesthesia plan  Post-op Assessment: Report given to RN and Post -op Vital signs reviewed and stable  Post vital signs: Reviewed and stable  Last Vitals:  Vitals Value Taken Time  BP 115/87   Temp    Pulse 88 12/14/19 1120  Resp 18 12/14/19 1120  SpO2 94 % 12/14/19 1120  Vitals shown include unvalidated device data.  Last Pain:  Vitals:   12/14/19 0800  TempSrc: Axillary         Complications: No apparent anesthesia complications

## 2019-12-15 LAB — BASIC METABOLIC PANEL
Anion gap: 9 (ref 5–15)
BUN: 26 mg/dL — ABNORMAL HIGH (ref 6–20)
CO2: 28 mmol/L (ref 22–32)
Calcium: 8.8 mg/dL — ABNORMAL LOW (ref 8.9–10.3)
Chloride: 108 mmol/L (ref 98–111)
Creatinine, Ser: 0.64 mg/dL (ref 0.61–1.24)
GFR calc Af Amer: >60 mL/min (ref >60)
GFR calc non Af Amer: >60 mL/min (ref >60)
Glucose, Bld: 114 mg/dL — ABNORMAL HIGH (ref 70–99)
Potassium: 3.6 mmol/L (ref 3.5–5.1)
Sodium: 145 mmol/L (ref 135–145)

## 2019-12-15 LAB — CBC
HCT: 27.6 % — ABNORMAL LOW (ref 39.0–52.0)
Hemoglobin: 8.6 g/dL — ABNORMAL LOW (ref 13.0–17.0)
MCH: 30 pg (ref 26.0–34.0)
MCHC: 31.2 g/dL (ref 30.0–36.0)
MCV: 96.2 fL (ref 80.0–100.0)
Platelets: 601 10*3/uL — ABNORMAL HIGH (ref 150–400)
RBC: 2.87 MIL/uL — ABNORMAL LOW (ref 4.22–5.81)
RDW: 13.9 % (ref 11.5–15.5)
WBC: 10.8 10*3/uL — ABNORMAL HIGH (ref 4.0–10.5)
nRBC: 0 % (ref 0.0–0.2)

## 2019-12-15 LAB — MAGNESIUM: Magnesium: 2 mg/dL (ref 1.7–2.4)

## 2019-12-15 LAB — GLUCOSE, CAPILLARY
Glucose-Capillary: 104 mg/dL — ABNORMAL HIGH (ref 70–99)
Glucose-Capillary: 109 mg/dL — ABNORMAL HIGH (ref 70–99)
Glucose-Capillary: 110 mg/dL — ABNORMAL HIGH (ref 70–99)
Glucose-Capillary: 121 mg/dL — ABNORMAL HIGH (ref 70–99)
Glucose-Capillary: 97 mg/dL (ref 70–99)
Glucose-Capillary: 98 mg/dL (ref 70–99)

## 2019-12-15 LAB — PHOSPHORUS: Phosphorus: 4 mg/dL (ref 2.5–4.6)

## 2019-12-15 LAB — CULTURE, BLOOD (ROUTINE X 2): Special Requests: ADEQUATE

## 2019-12-15 MED ORDER — OXYCODONE HCL 20 MG/ML PO CONC
5.0000 mg | ORAL | Status: DC | PRN
  Administered 2019-12-15 – 2019-12-23 (×22): 10 mg
  Administered 2019-12-31 (×2): 5 mg
  Administered 2020-01-01: 10 mg
  Filled 2019-12-15 (×25): qty 1

## 2019-12-15 NOTE — Anesthesia Postprocedure Evaluation (Signed)
Anesthesia Post Note  Patient: Candace W Doig  Procedure(s) Performed: OPEN TRACHEOSTOMY (N/A Neck) ESOPHAGOGASTRODUODENOSCOPY (EGD) (N/A Mouth) PERCUTANEOUS ENDOSCOPIC GASTROSTOMY (PEG) PLACEMENT (N/A Abdomen)     Patient location during evaluation: SICU Anesthesia Type: General Level of consciousness: sedated Pain management: pain level controlled Vital Signs Assessment: post-procedure vital signs reviewed and stable Respiratory status: patient remains intubated per anesthesia plan Cardiovascular status: stable Postop Assessment: no apparent nausea or vomiting Anesthetic complications: no    Last Vitals:  Vitals:   12/15/19 1700 12/15/19 1800  BP: 124/63 (!) 119/55  Pulse: 98 95  Resp: (!) 29 (!) 27  Temp:    SpO2: 97% 98%    Last Pain:  Vitals:   12/15/19 1600  TempSrc: Axillary   Pain Goal:                   Zia Kanner S
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
## 2019-12-15 NOTE — Progress Notes (Signed)
Patient ID: Clinton [name], male   DOB: 1996/02/22, 24 y.o.   MRN: [id number] Follow up - Trauma Critical Care  Patient Details:    Clinton [name] is an 24 y.o. male.  Lines/tubes : Gastrostomy/Enterostomy Percutaneous endoscopic gastrostomy (PEG) 24 Fr. LUQ (Active)  Surrounding Skin Dry;Intact 12/15/19 0800  Tube Status Patent 12/15/19 0800  Drainage Appearance None 12/15/19 0800  Dressing Status Clean;Dry;Intact 12/15/19 0800  Dressing Type Split gauze 12/15/19 0800  G Port Intake (mL) 80 ml 12/14/19 2137     External Urinary Catheter (Active)  Collection Container Standard drainage bag 12/15/19 0800  Securement Method Securing device (Describe) 12/15/19 0800  Site Assessment Clean;Intact 12/15/19 0800  Output (mL) 300 mL 12/15/19 0600    Microbiology/Sepsis markers: Results for orders placed or performed during the hospital encounter of 11/29/19  Respiratory Panel by RT PCR (Flu A&B, Covid) - Nasopharyngeal Swab     Status: None   Collection Time: 11/29/19 10:36 PM   Specimen: Nasopharyngeal Swab  Result Value Ref Range Status   SARS Coronavirus 2 by RT PCR NEGATIVE NEGATIVE Final    Comment: (NOTE) SARS-CoV-2 target nucleic acids are NOT DETECTED. The SARS-CoV-2 RNA is generally detectable in upper respiratoy specimens during the acute phase of infection. The lowest concentration of SARS-CoV-2 viral copies this assay can detect is 131 copies/mL. A negative result does not preclude SARS-Cov-2 infection and should not be used as the sole basis for treatment or other patient management decisions. A negative result may occur with  improper specimen collection/handling, submission of specimen other than nasopharyngeal swab, presence of viral mutation(s) within the areas targeted by this assay, and inadequate number of viral copies (<131 copies/mL). A negative result must be combined with clinical observations, patient history, and epidemiological information. The expected  result is Negative. Fact Sheet for Patients:  PinkCheek.be Fact Sheet for Healthcare Providers:  GravelBags.it This test is not yet ap proved or cleared by the [place] FDA and  has been authorized for detection and/or diagnosis of SARS-CoV-2 by FDA under an Emergency Use Authorization (EUA). This EUA will remain  in effect (meaning this test can be used) for the duration of the COVID-19 declaration under Section 564(b)(1) of the Act, 21 U.S.C. section 360bbb-3(b)(1), unless the authorization is terminated or revoked sooner.    Influenza A by PCR NEGATIVE NEGATIVE Final   Influenza B by PCR NEGATIVE NEGATIVE Final    Comment: (NOTE) The Xpert Xpress SARS-CoV-2/FLU/RSV assay is intended as an aid in  the diagnosis of influenza from Nasopharyngeal swab specimens and  should not be used as a sole basis for treatment. Nasal washings and  aspirates are unacceptable for Xpert Xpress SARS-CoV-2/FLU/RSV  testing. Fact Sheet for Patients: PinkCheek.be Fact Sheet for Healthcare Providers: GravelBags.it This test is not yet approved or cleared by the [place] FDA and  has been authorized for detection and/or diagnosis of SARS-CoV-2 by  FDA under an Emergency Use Authorization (EUA). This EUA will remain  in effect (meaning this test can be used) for the duration of the  Covid-19 declaration under Section 564(b)(1) of the Act, 21  U.S.C. section 360bbb-3(b)(1), unless the authorization is  terminated or revoked. Performed at [facility], [place] [address] [place], [place] [address]   MRSA PCR Screening     Status: None   Collection Time: 11/30/19 12:42 AM   Specimen: Nasal Mucosa; Nasopharyngeal  Result Value Ref Range Status   MRSA by PCR NEGATIVE NEGATIVE Final  Comment:        The GeneXpert MRSA Assay (FDA approved for NASAL specimens only), is one  component of a comprehensive MRSA colonization surveillance program. It is not intended to diagnose MRSA infection nor to guide or monitor treatment for MRSA infections. Performed at [facility], [place] [address], [place], [place] [address]   Surgical PCR screen     Status: None   Collection Time: 11/30/19  2:40 PM   Specimen: Nasal Mucosa; Nasal Swab  Result Value Ref Range Status   MRSA, PCR NEGATIVE NEGATIVE Final   Staphylococcus aureus NEGATIVE NEGATIVE Final    Comment: (NOTE) The Xpert SA Assay (FDA approved for NASAL specimens in patients 27 years of age and older), is one component of a comprehensive surveillance program. It is not intended to diagnose infection nor to guide or monitor treatment. Performed at [facility], [place] [address] [place], [place] [address]   Culture, blood (routine x 2)     Status: Abnormal (Preliminary result)   Collection Time: 12/10/19  9:50 AM   Specimen: BLOOD  Result Value Ref Range Status   Specimen Description BLOOD RIGHT ANTECUBITAL  Final   Special Requests   Final    BOTTLES DRAWN AEROBIC AND ANAEROBIC Blood Culture adequate volume   Culture  Setup Time   Final    GRAM POSITIVE COCCI IN CLUSTERS IN BOTH AEROBIC AND ANAEROBIC BOTTLES CRITICAL RESULT CALLED TO, READ BACK BY AND VERIFIED WITH: Sharen Heck PharmD 9:35 12/11/19 ([email or web address])    Culture (A)  Final    STAPHYLOCOCCUS EPIDERMIDIS SUSCEPTIBILITIES TO FOLLOW Performed at [facility], [place] [address] [place], [place] [address]    Report Status PENDING  Incomplete   Organism ID, Bacteria STAPHYLOCOCCUS EPIDERMIDIS  Final      Susceptibility   Staphylococcus epidermidis - MIC*    CIPROFLOXACIN >=8 RESISTANT Resistant     ERYTHROMYCIN >=8 RESISTANT Resistant     GENTAMICIN >=16 RESISTANT Resistant     OXACILLIN >=4 RESISTANT Resistant     TETRACYCLINE >=16 RESISTANT Resistant     VANCOMYCIN 4 SENSITIVE Sensitive     TRIMETH/SULFA 160 RESISTANT Resistant      CLINDAMYCIN >=8 RESISTANT Resistant     RIFAMPIN <=0.5 SENSITIVE Sensitive     Inducible Clindamycin NEGATIVE Sensitive     * STAPHYLOCOCCUS EPIDERMIDIS  Blood Culture ID Panel (Reflexed)     Status: Abnormal   Collection Time: 12/10/19  9:50 AM  Result Value Ref Range Status   Enterococcus species NOT DETECTED NOT DETECTED Final   Listeria monocytogenes NOT DETECTED NOT DETECTED Final   Staphylococcus species DETECTED (A) NOT DETECTED Final    Comment: Methicillin (oxacillin) resistant coagulase negative staphylococcus. Possible blood culture contaminant (unless isolated from more than one blood culture draw or clinical case suggests pathogenicity). No antibiotic treatment is indicated for blood  culture contaminants. CRITICAL RESULT CALLED TO, READ BACK BY AND VERIFIED WITH: Sharen Heck PharmD 9:35 12/11/19 ([email or web address])    Staphylococcus aureus (BCID) NOT DETECTED NOT DETECTED Final   Methicillin resistance DETECTED (A) NOT DETECTED Final    Comment: CRITICAL RESULT CALLED TO, READ BACK BY AND VERIFIED WITH: Sharen Heck PharmD 9:35 12/11/19 ([email or web address])    Streptococcus species NOT DETECTED NOT DETECTED Final   Streptococcus agalactiae NOT DETECTED NOT DETECTED Final   Streptococcus pneumoniae NOT DETECTED NOT DETECTED Final   Streptococcus pyogenes NOT DETECTED NOT DETECTED Final   Acinetobacter baumannii NOT DETECTED NOT DETECTED Final  Enterobacteriaceae species NOT DETECTED NOT DETECTED Final   Enterobacter cloacae complex NOT DETECTED NOT DETECTED Final   Escherichia coli NOT DETECTED NOT DETECTED Final   Klebsiella oxytoca NOT DETECTED NOT DETECTED Final   Klebsiella pneumoniae NOT DETECTED NOT DETECTED Final   Proteus species NOT DETECTED NOT DETECTED Final   Serratia marcescens NOT DETECTED NOT DETECTED Final   Haemophilus influenzae NOT DETECTED NOT DETECTED Final   Neisseria meningitidis NOT DETECTED NOT DETECTED Final   Pseudomonas aeruginosa NOT DETECTED NOT DETECTED  Final   Candida albicans NOT DETECTED NOT DETECTED Final   Candida glabrata NOT DETECTED NOT DETECTED Final   Candida krusei NOT DETECTED NOT DETECTED Final   Candida parapsilosis NOT DETECTED NOT DETECTED Final   Candida tropicalis NOT DETECTED NOT DETECTED Final    Comment: Performed at [facility], [place] [address] [place], [place] [address]  Culture, blood (routine x 2)     Status: Abnormal   Collection Time: 12/10/19 10:07 AM   Specimen: BLOOD  Result Value Ref Range Status   Specimen Description BLOOD RIGHT ANTECUBITAL  Final   Special Requests   Final    BOTTLES DRAWN AEROBIC AND ANAEROBIC Blood Culture adequate volume   Culture  Setup Time   Final    GRAM POSITIVE COCCI AEROBIC BOTTLE ONLY CRITICAL VALUE NOTED.  VALUE IS CONSISTENT WITH PREVIOUSLY REPORTED AND CALLED VALUE. Performed at [facility], [place] [address] [place], [place] [address]    Culture STAPHYLOCOCCUS EPIDERMIDIS (A)  Final   Report Status 12/14/2019 FINAL  Final   Organism ID, Bacteria STAPHYLOCOCCUS EPIDERMIDIS  Final      Susceptibility   Staphylococcus epidermidis - MIC*    CIPROFLOXACIN >=8 RESISTANT Resistant     ERYTHROMYCIN <=0.25 SENSITIVE Sensitive     GENTAMICIN >=16 RESISTANT Resistant     OXACILLIN >=4 RESISTANT Resistant     TETRACYCLINE >=16 RESISTANT Resistant     VANCOMYCIN 1 SENSITIVE Sensitive     TRIMETH/SULFA 160 RESISTANT Resistant     CLINDAMYCIN <=0.25 SENSITIVE Sensitive     RIFAMPIN <=0.5 SENSITIVE Sensitive     Inducible Clindamycin NEGATIVE Sensitive     * STAPHYLOCOCCUS EPIDERMIDIS  Culture, respiratory (non-expectorated)     Status: None   Collection Time: 12/10/19 10:55 AM   Specimen: Tracheal Aspirate; Respiratory  Result Value Ref Range Status   Specimen Description TRACHEAL ASPIRATE  Final   Special Requests NONE  Final   Gram Stain   Final    NO WBC SEEN RARE GRAM VARIABLE ROD RARE GRAM POSITIVE COCCI IN PAIRS    Culture   Final    FEW Consistent  with normal respiratory flora. Performed at [facility], [place] [address] [place], [place] [address]    Report Status 12/12/2019 FINAL  Final  Culture, blood (routine x 2)     Status: None (Preliminary result)   Collection Time: 12/12/19  2:40 PM   Specimen: BLOOD RIGHT ARM  Result Value Ref Range Status   Specimen Description BLOOD RIGHT ARM  Final   Special Requests   Final    BOTTLES DRAWN AEROBIC AND ANAEROBIC Blood Culture adequate volume   Culture   Final    NO GROWTH 3 DAYS Performed at [facility], [place] [address], [place], [place] [address]    Report Status PENDING  Incomplete  Culture, blood (routine x 2)     Status: None (Preliminary result)   Collection Time: 12/12/19  2:50  PM   Specimen: BLOOD  Result Value Ref Range Status   Specimen Description BLOOD RIGHT ANTECUBITAL  Final   Special Requests   Final    BOTTLES DRAWN AEROBIC AND ANAEROBIC Blood Culture adequate volume   Culture   Final    NO GROWTH 3 DAYS Performed at [facility], [address] [place], [place] [address]    Report Status PENDING  Incomplete    Anti-infectives:  Anti-infectives (From admission, onward)   Start     Dose/Rate Route Frequency Ordered Stop   12/12/19 1945  vancomycin (VANCOREADY) IVPB 1250 mg/250 mL  Status:  Discontinued     1,250 mg 166.7 mL/hr over 90 Minutes Intravenous Every 8 hours 12/12/19 1930 12/14/19 1115   12/10/19 1200  ceFEPIme (MAXIPIME) 2 g in sodium chloride 0.9 % 100 mL IVPB  Status:  Discontinued     2 g 200 mL/hr over 30 Minutes Intravenous Every 8 hours 12/10/19 0814 12/12/19 1414   11/30/19 1830  ceFAZolin (ANCEF) IVPB 2g/100 mL premix     2 g 200 mL/hr over 30 Minutes Intravenous Every 8 hours 11/30/19 1726 12/01/19 1521   11/30/19 1545  vancomycin (VANCOCIN) powder  Status:  Discontinued       As needed 11/30/19 1546 11/30/19 1649   11/30/19 1430  ceFAZolin (ANCEF) IVPB 2g/100 mL premix  Status:  Discontinued     2 g 200 mL/hr over  30 Minutes Intravenous On call to O.R. 11/30/19 1134 11/30/19 1654   11/29/19 2215  ceFAZolin (ANCEF) IVPB 2g/100 mL premix     2 g 200 mL/hr over 30 Minutes Intravenous  Once 11/29/19 2210 11/29/19 2317      Best Practice/Protocols:  VTE Prophylaxis: Lovenox (prophylaxtic dose) Continous Sedation  Consults: Treatment Team:  Eustace Moore, MD Shona Needles, MD Erline Levine, MD    Studies:    Events:  Subjective:    Overnight Issues:   Objective:  Vital signs for last 24 hours: Temp:  [98.4 F (36.9 C)-99.6 F (37.6 C)] 98.9 F (37.2 C) (05/07 0400) Pulse Rate:  [71-126] 91 (05/07 0800) Resp:  [12-17] 12 (05/07 0800) BP: (108-147)/(59-113) 141/79 (05/07 0800) SpO2:  [91 %-100 %] 97 % (05/07 0800) FiO2 (%):  [30 %] 30 % (05/07 0721) Weight:  [82.4 kg] 82.4 kg (05/07 0500)  Hemodynamic parameters for last 24 hours:    Intake/Output from previous day: 05/06 0701 - 05/07 0700 In: 2056.7 [I.V.:936.7; NG/GT:1040] Out: 1975 [Urine:1975]  Intake/Output this shift: Total I/O In: 79.1 [I.V.:14.1; NG/GT:65] Out: -   Vent settings for last 24 hours: Vent Mode: PSV;CPAP FiO2 (%):  [30 %] 30 % Set Rate:  [15 bmp] 15 bmp Vt Set:  [580 mL] 580 mL PEEP:  [5 cmH20] 5 cmH20 Pressure Support:  [10 cmH20] 10 cmH20 Plateau Pressure:  [15 cmH20-16 cmH20] 16 cmH20  Physical Exam:  General: no respiratory distress Neuro: awake on vent, F/C RUE and RLE HEENT/Neck: trach-clean, intact Resp: clear to auscultation bilaterally CVS: RRR GI: soft, PEG in place, NT Extremities: splint LLE  Results for orders placed or performed during the hospital encounter of 11/29/19 (from the past 24 hour(s))  Glucose, capillary     Status: None   Collection Time: 12/14/19  8:21 AM  Result Value Ref Range   Glucose-Capillary 84 70 - 99 mg/dL  Glucose, capillary     Status: None   Collection Time: 12/14/19 12:38 PM  Result Value Ref Range   Glucose-Capillary  86 70 - 99 mg/dL   Glucose, capillary     Status: Abnormal   Collection Time: 12/14/19  4:19 PM  Result Value Ref Range   Glucose-Capillary 101 (H) 70 - 99 mg/dL  Glucose, capillary     Status: Abnormal   Collection Time: 12/14/19  7:37 PM  Result Value Ref Range   Glucose-Capillary 121 (H) 70 - 99 mg/dL  Glucose, capillary     Status: Abnormal   Collection Time: 12/14/19 11:38 PM  Result Value Ref Range   Glucose-Capillary 109 (H) 70 - 99 mg/dL  Glucose, capillary     Status: Abnormal   Collection Time: 12/15/19  3:41 AM  Result Value Ref Range   Glucose-Capillary 109 (H) 70 - 99 mg/dL  CBC     Status: Abnormal   Collection Time: 12/15/19  5:11 AM  Result Value Ref Range   WBC 10.8 (H) 4.0 - 10.5 K/uL   RBC 2.87 (L) 4.22 - 5.81 MIL/uL   Hemoglobin 8.6 (L) 13.0 - 17.0 g/dL   HCT 27.6 (L) 39.0 - 52.0 %   MCV 96.2 80.0 - 100.0 fL   MCH 30.0 26.0 - 34.0 pg   MCHC 31.2 30.0 - 36.0 g/dL   RDW 13.9 11.5 - 15.5 %   Platelets 601 (H) 150 - 400 K/uL   nRBC 0.0 0.0 - 0.2 %  Basic metabolic panel     Status: Abnormal   Collection Time: 12/15/19  5:11 AM  Result Value Ref Range   Sodium 145 135 - 145 mmol/L   Potassium 3.6 3.5 - 5.1 mmol/L   Chloride 108 98 - 111 mmol/L   CO2 28 22 - 32 mmol/L   Glucose, Bld 114 (H) 70 - 99 mg/dL   BUN 26 (H) 6 - 20 mg/dL   Creatinine, Ser 0.64 0.61 - 1.24 mg/dL   Calcium 8.8 (L) 8.9 - 10.3 mg/dL   GFR calc non Af Amer >60 >60 mL/min   GFR calc Af Amer >60 >60 mL/min   Anion gap 9 5 - 15  Magnesium     Status: None   Collection Time: 12/15/19  5:11 AM  Result Value Ref Range   Magnesium 2.0 1.7 - 2.4 mg/dL  Phosphorus     Status: None   Collection Time: 12/15/19  5:11 AM  Result Value Ref Range   Phosphorus 4.0 2.5 - 4.6 mg/dL    Assessment & Plan: Present on Admission: . ICH (intracerebral hemorrhage) ([place])    LOS: 16 days   Additional comments:I reviewed the patient's new clinical lab test results. Marland Kitchen PHBC  SDH, IPH- NSGY c/s (Dr. Ronnald Ramp), on home  AED regimen,EEGnegative yesterday, repeat head CT 4/23with blossoming of ICH, neuro exam has improved somewhat. On valproate, propranolol Scattered abrasions- wound care VDRF- S/P trach 5/7, weaning on 10/5, HTC as able L comminuted tib/fib fx-s/p IMN,TDWB Alcohol abuse- SW c/s for SBIRTonce off the vent, thiamine, folate FEN - S/P PEG 5/7, TF ABL anemia -monitor VTE - SCDs,LMWH ID - no further ABX per ID Dispo -ICU, full code, plan SNF  Critical Care Total Time*: 34 Minutes  Georganna Skeans, MD, MPH, FACS Trauma & General Surgery Use [email or web address] to contact on call provider  12/15/2019  *Care during the described time interval was provided by me. I have reviewed this patient's available data, including medical history, events of note, physical examination and test results as part of my evaluation.

## 2019-12-16 ENCOUNTER — Inpatient Hospital Stay (HOSPITAL_COMMUNITY): Payer: Medicaid Other

## 2019-12-16 LAB — MAGNESIUM: Magnesium: 2 mg/dL (ref 1.7–2.4)

## 2019-12-16 LAB — CBC
HCT: 29.8 % — ABNORMAL LOW (ref 39.0–52.0)
Hemoglobin: 9.3 g/dL — ABNORMAL LOW (ref 13.0–17.0)
MCH: 30.2 pg (ref 26.0–34.0)
MCHC: 31.2 g/dL (ref 30.0–36.0)
MCV: 96.8 fL (ref 80.0–100.0)
Platelets: 562 10*3/uL — ABNORMAL HIGH (ref 150–400)
RBC: 3.08 MIL/uL — ABNORMAL LOW (ref 4.22–5.81)
RDW: 13.8 % (ref 11.5–15.5)
WBC: 12.7 10*3/uL — ABNORMAL HIGH (ref 4.0–10.5)
nRBC: 0 % (ref 0.0–0.2)

## 2019-12-16 LAB — BASIC METABOLIC PANEL
Anion gap: 12 (ref 5–15)
BUN: 21 mg/dL — ABNORMAL HIGH (ref 6–20)
CO2: 29 mmol/L (ref 22–32)
Calcium: 9.8 mg/dL (ref 8.9–10.3)
Chloride: 106 mmol/L (ref 98–111)
Creatinine, Ser: 0.67 mg/dL (ref 0.61–1.24)
GFR calc Af Amer: >60 mL/min (ref >60)
GFR calc non Af Amer: >60 mL/min (ref >60)
Glucose, Bld: 106 mg/dL — ABNORMAL HIGH (ref 70–99)
Potassium: 4.1 mmol/L (ref 3.5–5.1)
Sodium: 147 mmol/L — ABNORMAL HIGH (ref 135–145)

## 2019-12-16 LAB — GLUCOSE, CAPILLARY
Glucose-Capillary: 90 mg/dL (ref 70–99)
Glucose-Capillary: 108 mg/dL — ABNORMAL HIGH (ref 70–99)
Glucose-Capillary: 111 mg/dL — ABNORMAL HIGH (ref 70–99)
Glucose-Capillary: 130 mg/dL — ABNORMAL HIGH (ref 70–99)
Glucose-Capillary: 107 mg/dL — ABNORMAL HIGH (ref 70–99)

## 2019-12-16 LAB — PHOSPHORUS: Phosphorus: 4.8 mg/dL — ABNORMAL HIGH (ref 2.5–4.6)

## 2019-12-16 MED ORDER — ORAL CARE MOUTH RINSE
15.0000 mL | Freq: Two times a day (BID) | OROMUCOSAL | Status: DC
  Administered 2019-12-16 – 2019-12-31 (×29): 15 mL via OROMUCOSAL

## 2019-12-16 MED ORDER — CHLORHEXIDINE GLUCONATE 0.12 % MT SOLN
15.0000 mL | Freq: Two times a day (BID) | OROMUCOSAL | Status: DC
  Administered 2019-12-16 – 2020-01-01 (×32): 15 mL via OROMUCOSAL
  Filled 2019-12-16 (×30): qty 15

## 2019-12-16 NOTE — Progress Notes (Signed)
Patient ID: Clinton [name], male   DOB: December 02, 1995, 24 y.o.   MRN: [id number] 2 Days Post-Op   Subjective: Stayed on HTC all night  ROS negative except as listed above. Objective: Vital signs in last 24 hours: Temp:  [98.5 F (36.9 C)-99.3 F (37.4 C)] 98.7 F (37.1 C) (05/08 0400) Pulse Rate:  [78-120] 105 (05/08 0900) Resp:  [9-31] 20 (05/08 0900) BP: (110-139)/(54-76) 139/76 (05/08 0900) SpO2:  [80 %-99 %] 97 % (05/08 0900) FiO2 (%):  [40 %] 40 % (05/08 0721) Last BM Date: 12/15/19  Intake/Output from previous day: 05/07 0701 - 05/08 0700 In: 1725.5 [I.V.:230.5; NG/GT:1495] Out: 2000 [Urine:2000] Intake/Output this shift: Total I/O In: 7.5 [I.V.:7.5] Out: -   General appearance: no distress Neck: trach in place Resp: clear to auscultation bilaterally Cardio: regular rate and rhythm GI: PEG in place, soft, NT Extremities: LLE ortho dressing Neuro: awake, follows some commands on R  Lab Results: CBC  Recent Labs    12/15/19 0511 12/16/19 0609  WBC 10.8* 12.7*  HGB 8.6* 9.3*  HCT 27.6* 29.8*  PLT 601* 562*   BMET Recent Labs    12/15/19 0511 12/16/19 0609  NA 145 147*  K 3.6 4.1  CL 108 106  CO2 28 29  GLUCOSE 114* 106*  BUN 26* 21*  CREATININE 0.64 0.67  CALCIUM 8.8* 9.8   PT/INR Recent Labs    12/14/19 0159  LABPROT 13.7  INR 1.1   ABG No results for input(s): PHART, HCO3 in the last 72 hours.  Invalid input(s): PCO2, PO2  Studies/Results: CT HEAD WO CONTRAST  Result Date: 12/16/2019 CLINICAL DATA:  Head trauma, subacute, neuro/cognitive deficit. Follow-up. EXAM: CT HEAD WITHOUT CONTRAST TECHNIQUE: Contiguous axial images were obtained from the base of the skull through the vertex without intravenous contrast. COMPARISON:  Prior head CT examinations 12/01/2019 and earlier FINDINGS: Brain: Redemonstrated foci of chronic encephalomalacia within the right greater than left temporal lobes and right frontal operculum. Chronic cerebral volume  loss. There has been interval increase in size of a mixed density left holohemispheric subdural hematoma, now measuring up to 8 mm in thickness. However, this collection is now predominantly intermediate to low density. There is a new low-density subdural collection overlying the right cerebral hemisphere greatest within the para falcine region measuring 13 mm in thickness. 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 Hyperdense parenchymal hemorrhage within the right basal ganglia has decreased in size and conspicuity, now measuring 3.4 x 1.6 cm in transaxial dimensions (previously 5.7 x 2.7). There is persistent edema at this site. There is persistent although decreased mass effect with persistent although decreased effacement of the right lateral ventricle. Hemorrhage at sites of previously demonstrated multifocal shear hemorrhage or hemorrhagic contusion within the cerebral hemispheres, corpus callosum and brainstem have significantly decreased in conspicuity with many foci now poorly appreciated. Previously demonstrated scattered small volume subarachnoid hemorrhage has also significantly decreased in conspicuity. Previously demonstrated intraventricular hemorrhage is no longer appreciable. There is no demarcated cortical infarction. No midline shift. No evidence of hydrocephalus Vascular: No hyperdense vessel. Skull: Previous right-sided craniotomy.  No skull fracture. Sinuses/Orbits: Visualized orbits show no acute finding. Paranasal sinus mucosal thickening most notably within the posterior ethmoid and sphenoid sinuses. Right mastoid effusion with left middle ear/mastoid effusion. IMPRESSION: 1. 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 2. Interval increase in size of a mixed density left holohemispheric  subdural hematoma. This now measures up to 8 mm in thickness, but is predominantly  intermediate to low density. 3. Interval decrease in size and conspicuity of parenchymal hemorrhage centered within the right basal ganglia. 4. Hemorrhage at sites of previously demonstrated multifocal shear hemorrhage or hemorrhagic contusion within the cerebral hemispheres, corpus callosum and brainstem have significantly decreased in conspicuity with many foci now poorly appreciated. 5. Previously demonstrated scattered small volume subarachnoid hemorrhage has also significantly decreased in conspicuity. 6. Previously demonstrated intraventricular hemorrhage is no longer appreciable. 7. No midline shift. Electronically Signed   By: Kellie Simmering DO   On: 12/16/2019 07:09   DG CHEST PORT 1 VIEW  Result Date: 12/14/2019 CLINICAL DATA:  Tracheostomy in place. EXAM: PORTABLE CHEST 1 VIEW COMPARISON:  12/07/2019 FINDINGS: Tracheostomy tube is present and appears to be appropriately positioned. Again noted is a surgical plate involving the right clavicle with a fracture. ECG lines and support apparatuses overlying the chest. Lungs are clear without focal airspace disease. Negative for a pneumothorax. Heart size is normal. Trachea is midline. IMPRESSION: 1. Tracheostomy tube present. 2. No focal lung disease. Electronically Signed   By: Markus Daft M.D.   On: 12/14/2019 12:23    Anti-infectives: Anti-infectives (From admission, onward)   Start     Dose/Rate Route Frequency Ordered Stop   12/12/19 1945  vancomycin (VANCOREADY) IVPB 1250 mg/250 mL  Status:  Discontinued     1,250 mg 166.7 mL/hr over 90 Minutes Intravenous Every 8 hours 12/12/19 1930 12/14/19 1115   12/10/19 1200  ceFEPIme (MAXIPIME) 2 g in sodium chloride 0.9 % 100 mL IVPB  Status:  Discontinued     2 g 200 mL/hr over 30 Minutes Intravenous Every 8 hours 12/10/19 0814 12/12/19 1414   11/30/19 1830  ceFAZolin (ANCEF) IVPB 2g/100 mL premix     2 g 200 mL/hr over 30 Minutes Intravenous Every 8 hours 11/30/19 1726 12/01/19 1521   11/30/19 1545   vancomycin (VANCOCIN) powder  Status:  Discontinued       As needed 11/30/19 1546 11/30/19 1649   11/30/19 1430  ceFAZolin (ANCEF) IVPB 2g/100 mL premix  Status:  Discontinued     2 g 200 mL/hr over 30 Minutes Intravenous On call to O.R. 11/30/19 1134 11/30/19 1654   11/29/19 2215  ceFAZolin (ANCEF) IVPB 2g/100 mL premix     2 g 200 mL/hr over 30 Minutes Intravenous  Once 11/29/19 2210 11/29/19 2317      Assessment/Plan: PHBC  SDH, IPH- NSGY c/s (Dr. Ronnald Ramp), on home AED regimen,EEGnegative yesterday, repeat head CT 4/23with blossoming of ICH, neuro exam has improved somewhat. On valproate, propranolol. F/U CT head this AM with new subdural hygroma - per NS Scattered abrasions- wound care VDRF- S/P trach 5/7, HTC 24h as able L comminuted tib/fib fx-s/p IMN,TDWB Alcohol abuse- SW c/s for SBIRTonce off the vent, thiamine, folate FEN - S/P PEG 5/7, TF ABL anemia -monitor VTE - SCDs,LMWH ID - no further ABX per ID Dispo -ICU, full code, plan SNF  LOS: 17 days    Georganna Skeans, MD, MPH, FACS Trauma & General Surgery Use [email or web address] to contact on call provider  12/16/2019

## 2019-12-17 LAB — CULTURE, BLOOD (ROUTINE X 2)
Culture: NO GROWTH
Culture: NO GROWTH
Special Requests: ADEQUATE
Special Requests: ADEQUATE

## 2019-12-17 LAB — BASIC METABOLIC PANEL
Anion gap: 13 (ref 5–15)
BUN: 21 mg/dL — ABNORMAL HIGH (ref 6–20)
CO2: 29 mmol/L (ref 22–32)
Calcium: 10.6 mg/dL — ABNORMAL HIGH (ref 8.9–10.3)
Chloride: 107 mmol/L (ref 98–111)
Creatinine, Ser: 0.65 mg/dL (ref 0.61–1.24)
GFR calc Af Amer: >60 mL/min (ref >60)
GFR calc non Af Amer: >60 mL/min (ref >60)
Glucose, Bld: 107 mg/dL — ABNORMAL HIGH (ref 70–99)
Potassium: 4.2 mmol/L (ref 3.5–5.1)
Sodium: 149 mmol/L — ABNORMAL HIGH (ref 135–145)

## 2019-12-17 LAB — GLUCOSE, CAPILLARY
Glucose-Capillary: 100 mg/dL — ABNORMAL HIGH (ref 70–99)
Glucose-Capillary: 104 mg/dL — ABNORMAL HIGH (ref 70–99)
Glucose-Capillary: 105 mg/dL — ABNORMAL HIGH (ref 70–99)
Glucose-Capillary: 110 mg/dL — ABNORMAL HIGH (ref 70–99)
Glucose-Capillary: 124 mg/dL — ABNORMAL HIGH (ref 70–99)
Glucose-Capillary: 125 mg/dL — ABNORMAL HIGH (ref 70–99)

## 2019-12-17 LAB — CBC
HCT: 32.8 % — ABNORMAL LOW (ref 39.0–52.0)
Hemoglobin: 10 g/dL — ABNORMAL LOW (ref 13.0–17.0)
MCH: 29.2 pg (ref 26.0–34.0)
MCHC: 30.5 g/dL (ref 30.0–36.0)
MCV: 95.6 fL (ref 80.0–100.0)
Platelets: 604 10*3/uL — ABNORMAL HIGH (ref 150–400)
RBC: 3.43 MIL/uL — ABNORMAL LOW (ref 4.22–5.81)
RDW: 13.6 % (ref 11.5–15.5)
WBC: 13.6 10*3/uL — ABNORMAL HIGH (ref 4.0–10.5)
nRBC: 0 % (ref 0.0–0.2)

## 2019-12-17 NOTE — Progress Notes (Signed)
Patient ID: Clinton [name], male   DOB: [birth year], 24 y.o.   MRN: [id number] 3 Days Post-Op   Subjective: Opens eyes to voice and moves R side spontaneously  ROS negative except as listed above. Objective: Vital signs in last 24 hours: Temp:  [98.2 F (36.8 C)-99.8 F (37.7 C)] 99.4 F (37.4 C) (05/09 0424) Pulse Rate:  [90-129] 90 (05/09 0725) Resp:  [12-39] 22 (05/09 0725) BP: (108-140)/(56-88) 117/65 (05/09 0725) SpO2:  [95 %-100 %] 98 % (05/09 0700) FiO2 (%):  [40 %] 40 % (05/09 0725) Last BM Date: 12/17/19  Intake/Output from previous day: 05/08 0701 - 05/09 0700 In: 1243 [I.V.:8; NG/GT:1235] Out: 1450 [Urine:1450] Intake/Output this shift: No intake/output data recorded.  General appearance: no distress Neck: trach in place Resp: clear to auscultation bilaterally Cardio: regular rate and rhythm GI: PEG in place, soft, NT Extremities: LLE ortho dressing Neuro: awake, follows some commands on R  Lab Results: CBC  Recent Labs    12/16/19 0609 12/17/19 0519  WBC 12.7* 13.6*  HGB 9.3* 10.0*  HCT 29.8* 32.8*  PLT 562* 604*   BMET Recent Labs    12/16/19 0609 12/17/19 0519  NA 147* 149*  K 4.1 4.2  CL 106 107  CO2 29 29  GLUCOSE 106* 107*  BUN 21* 21*  CREATININE 0.67 0.65  CALCIUM 9.8 10.6*   PT/INR No results for input(s): LABPROT, INR in the last 72 hours. ABG No results for input(s): PHART, HCO3 in the last 72 hours.  Invalid input(s): PCO2, PO2  Studies/Results: CT HEAD WO CONTRAST  Result Date: 12/16/2019 CLINICAL DATA:  Head trauma, subacute, neuro/cognitive deficit. Follow-up. EXAM: CT HEAD WITHOUT CONTRAST TECHNIQUE: Contiguous axial images were obtained from the base of the skull through the vertex without intravenous contrast. COMPARISON:  Prior head CT examinations 12/01/2019 and earlier FINDINGS: Brain: Redemonstrated foci of chronic encephalomalacia within the right greater than left temporal lobes and right frontal operculum. Chronic  cerebral volume loss. There has been interval increase in size of a mixed density left holohemispheric subdural hematoma, now measuring up to 8 mm in thickness. However, this collection is now predominantly intermediate to low density. There is a new low-density subdural collection overlying the right cerebral hemisphere greatest within the para falcine region measuring 13 mm in thickness. 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 Hyperdense parenchymal hemorrhage within the right basal ganglia has decreased in size and conspicuity, now measuring 3.4 x 1.6 cm in transaxial dimensions (previously 5.7 x 2.7). There is persistent edema at this site. There is persistent although decreased mass effect with persistent although decreased effacement of the right lateral ventricle. Hemorrhage at sites of previously demonstrated multifocal shear hemorrhage or hemorrhagic contusion within the cerebral hemispheres, corpus callosum and brainstem have significantly decreased in conspicuity with many foci now poorly appreciated. Previously demonstrated scattered small volume subarachnoid hemorrhage has also significantly decreased in conspicuity. Previously demonstrated intraventricular hemorrhage is no longer appreciable. There is no demarcated cortical infarction. No midline shift. No evidence of hydrocephalus Vascular: No hyperdense vessel. Skull: Previous right-sided craniotomy.  No skull fracture. Sinuses/Orbits: Visualized orbits show no acute finding. Paranasal sinus mucosal thickening most notably within the posterior ethmoid and sphenoid sinuses. Right mastoid effusion with left middle ear/mastoid effusion. IMPRESSION: 1. 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 2. Interval increase in size of a mixed density left holohemispheric subdural hematoma. This now  measures up to 8 mm in thickness, but is  predominantly intermediate to low density. 3. Interval decrease in size and conspicuity of parenchymal hemorrhage centered within the right basal ganglia. 4. Hemorrhage at sites of previously demonstrated multifocal shear hemorrhage or hemorrhagic contusion within the cerebral hemispheres, corpus callosum and brainstem have significantly decreased in conspicuity with many foci now poorly appreciated. 5. Previously demonstrated scattered small volume subarachnoid hemorrhage has also significantly decreased in conspicuity. 6. Previously demonstrated intraventricular hemorrhage is no longer appreciable. 7. No midline shift. Electronically Signed   By: Kellie Simmering DO   On: 12/16/2019 07:09    Anti-infectives: Anti-infectives (From admission, onward)   Start     Dose/Rate Route Frequency Ordered Stop   12/12/19 1945  vancomycin (VANCOREADY) IVPB 1250 mg/250 mL  Status:  Discontinued     1,250 mg 166.7 mL/hr over 90 Minutes Intravenous Every 8 hours 12/12/19 1930 12/14/19 1115   12/10/19 1200  ceFEPIme (MAXIPIME) 2 g in sodium chloride 0.9 % 100 mL IVPB  Status:  Discontinued     2 g 200 mL/hr over 30 Minutes Intravenous Every 8 hours 12/10/19 0814 12/12/19 1414   11/30/19 1830  ceFAZolin (ANCEF) IVPB 2g/100 mL premix     2 g 200 mL/hr over 30 Minutes Intravenous Every 8 hours 11/30/19 1726 12/01/19 1521   11/30/19 1545  vancomycin (VANCOCIN) powder  Status:  Discontinued       As needed 11/30/19 1546 11/30/19 1649   11/30/19 1430  ceFAZolin (ANCEF) IVPB 2g/100 mL premix  Status:  Discontinued     2 g 200 mL/hr over 30 Minutes Intravenous On call to O.R. 11/30/19 1134 11/30/19 1654   11/29/19 2215  ceFAZolin (ANCEF) IVPB 2g/100 mL premix     2 g 200 mL/hr over 30 Minutes Intravenous  Once 11/29/19 2210 11/29/19 2317      Assessment/Plan: PHBC  SDH, IPH- NSGY c/s (Dr. Ronnald Ramp), on home AED regimen,EEGnegative, repeat head CT 4/23with blossoming of ICH, neuro exam has improved somewhat. On  valproate, propranolol - per NS Scattered abrasions- wound care VDRF- S/P trach 5/7, HTC 24h as able L comminuted tib/fib fx-s/p IMN,TDWB Alcohol abuse- SW c/s for SBIRTonce off the vent, thiamine, folate FEN - S/P PEG 5/7, TF ABL anemia -monitor VTE - SCDs,LMWH ID - no further ABX per ID Dispo -ICU, full code, plan SNF   LOS: 18 days   CRITICAL CARE Performed by: Ileana Roup   Total critical care time: 35 minutes  Critical care time was exclusive of separately billable procedures and treating other patients.  Critical care was necessary to treat or prevent imminent or life-threatening deterioration.  Critical care was time spent personally by me on the following activities: development of treatment plan with patient and/or surrogate as well as nursing, discussions with consultants, evaluation of patient's response to treatment, examination of patient, obtaining history from patient or surrogate, ordering and performing treatments and interventions, ordering and review of laboratory studies, ordering and review of radiographic studies, pulse oximetry and re-evaluation of patient's condition.  Nadeen Landau, M.D. [facility]  12/17/2019

## 2019-12-18 LAB — BASIC METABOLIC PANEL
Anion gap: 15 (ref 5–15)
BUN: 29 mg/dL — ABNORMAL HIGH (ref 6–20)
CO2: 28 mmol/L (ref 22–32)
Calcium: 10.2 mg/dL (ref 8.9–10.3)
Chloride: 110 mmol/L (ref 98–111)
Creatinine, Ser: 0.75 mg/dL (ref 0.61–1.24)
GFR calc Af Amer: >60 mL/min (ref >60)
GFR calc non Af Amer: >60 mL/min (ref >60)
Glucose, Bld: 108 mg/dL — ABNORMAL HIGH (ref 70–99)
Potassium: 3.9 mmol/L (ref 3.5–5.1)
Sodium: 153 mmol/L — ABNORMAL HIGH (ref 135–145)

## 2019-12-18 LAB — GLUCOSE, CAPILLARY
Glucose-Capillary: 105 mg/dL — ABNORMAL HIGH (ref 70–99)
Glucose-Capillary: 108 mg/dL — ABNORMAL HIGH (ref 70–99)
Glucose-Capillary: 108 mg/dL — ABNORMAL HIGH (ref 70–99)
Glucose-Capillary: 116 mg/dL — ABNORMAL HIGH (ref 70–99)
Glucose-Capillary: 128 mg/dL — ABNORMAL HIGH (ref 70–99)
Glucose-Capillary: 92 mg/dL (ref 70–99)
Glucose-Capillary: 97 mg/dL (ref 70–99)

## 2019-12-18 NOTE — Progress Notes (Signed)
Trauma/Critical Care Follow Up Note  Subjective:    Overnight Issues:   Objective:  Vital signs for last 24 hours: Temp:  [97.7 F (36.5 C)-98.9 F (37.2 C)] 98.9 F (37.2 C) (05/10 0800) Pulse Rate:  [88-161] 161 (05/10 0900) Resp:  [17-41] 33 (05/10 0900) BP: (108-143)/(64-117) 141/89 (05/10 0900) SpO2:  [91 %-99 %] 97 % (05/10 0900) FiO2 (%):  [28 %-35 %] 28 % (05/10 0812) Weight:  [81.5 kg] 81.5 kg (05/10 0354)  Hemodynamic parameters for last 24 hours:    Intake/Output from previous day: 05/09 0701 - 05/10 0700 In: 1640 [NG/GT:1560] Out: 1660 [Urine:1660]  Intake/Output this shift: Total I/O In: 130 [NG/GT:130] Out: 200 [Urine:200]  Vent settings for last 24 hours: FiO2 (%):  [28 %-35 %] 28 %  Physical Exam:  Gen: comfortable, no distress Neuro: non-focal exam HEENT: PERRL Neck: supple CV: RRR Pulm: unlabored breathing Abd: soft, NT GU: clear yellow urine Extr: wwp, no edema   Results for orders placed or performed during the hospital encounter of 11/29/19 (from the past 24 hour(s))  Glucose, capillary     Status: Abnormal   Collection Time: 12/17/19 11:46 AM  Result Value Ref Range   Glucose-Capillary 125 (H) 70 - 99 mg/dL  Glucose, capillary     Status: Abnormal   Collection Time: 12/17/19  3:51 PM  Result Value Ref Range   Glucose-Capillary 110 (H) 70 - 99 mg/dL  Glucose, capillary     Status: Abnormal   Collection Time: 12/17/19  8:32 PM  Result Value Ref Range   Glucose-Capillary 105 (H) 70 - 99 mg/dL  Glucose, capillary     Status: None   Collection Time: 12/17/19 11:53 PM  Result Value Ref Range   Glucose-Capillary 92 70 - 99 mg/dL  Glucose, capillary     Status: Abnormal   Collection Time: 12/18/19  3:53 AM  Result Value Ref Range   Glucose-Capillary 108 (H) 70 - 99 mg/dL  Basic metabolic panel     Status: Abnormal   Collection Time: 12/18/19  6:42 AM  Result Value Ref Range   Sodium 153 (H) 135 - 145 mmol/L   Potassium 3.9 3.5 -  5.1 mmol/L   Chloride 110 98 - 111 mmol/L   CO2 28 22 - 32 mmol/L   Glucose, Bld 108 (H) 70 - 99 mg/dL   BUN 29 (H) 6 - 20 mg/dL   Creatinine, Ser 0.75 0.61 - 1.24 mg/dL   Calcium 10.2 8.9 - 10.3 mg/dL   GFR calc non Af Amer >60 >60 mL/min   GFR calc Af Amer >60 >60 mL/min   Anion gap 15 5 - 15  Glucose, capillary     Status: None   Collection Time: 12/18/19  8:15 AM  Result Value Ref Range   Glucose-Capillary 97 70 - 99 mg/dL    Assessment & Plan: The plan of care was discussed with the bedside nurse for the day, Jarrett Soho, who is in agreement with this plan and no additional concerns were raised.   Present on Admission: . ICH (intracerebral hemorrhage) ([place])    LOS: 19 days   Additional comments:I reviewed the patient's new clinical lab test results.   and I reviewed the patients new imaging test results.    PHBC  SDH, IPH- NSGY c/s (Dr. Ronnald Ramp), on home AED regimen,EEGnegative, repeat head CT 4/23with blossoming of ICH, neuro exam has improved somewhat. On valproate, propranolol - per NS Scattered abrasions- wound care VDRF- S/P trach 5/7,  continue TC L comminuted tib/fib fx-s/p IMN,TDWB Alcohol abuse- SW c/s for SBIRTonceoff the vent, thiamine, folate FEN - s/p PEG 5/7, TF ABL anemia -monitor VTE - SCDs,LMWH Dispo - SDU, plan SNF, continue therapies   Jesusita Oka, MD Trauma & General Surgery Please use [email or web address] to contact on call provider  12/18/2019  *Care during the described time interval was provided by me. I have reviewed this patient's available data, including medical history, events of note, physical examination and test results as part of my evaluation.

## 2019-12-18 NOTE — Progress Notes (Signed)
10 MD [place] paged in regard to pt HR--MD okay with elevated HR if doesn't sustain.    [place] paged as pt HR sustaining 140-150 x5 min.   1900 HR continues to fluctuate 110s-150s.
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
## 2019-12-19 LAB — GLUCOSE, CAPILLARY
Glucose-Capillary: 102 mg/dL — ABNORMAL HIGH (ref 70–99)
Glucose-Capillary: 113 mg/dL — ABNORMAL HIGH (ref 70–99)
Glucose-Capillary: 113 mg/dL — ABNORMAL HIGH (ref 70–99)
Glucose-Capillary: 115 mg/dL — ABNORMAL HIGH (ref 70–99)
Glucose-Capillary: 120 mg/dL — ABNORMAL HIGH (ref 70–99)
Glucose-Capillary: 124 mg/dL — ABNORMAL HIGH (ref 70–99)

## 2019-12-19 LAB — BASIC METABOLIC PANEL
Anion gap: 13 (ref 5–15)
BUN: 31 mg/dL — ABNORMAL HIGH (ref 6–20)
CO2: 31 mmol/L (ref 22–32)
Calcium: 10.4 mg/dL — ABNORMAL HIGH (ref 8.9–10.3)
Chloride: 112 mmol/L — ABNORMAL HIGH (ref 98–111)
Creatinine, Ser: 0.8 mg/dL (ref 0.61–1.24)
GFR calc Af Amer: >60 mL/min (ref >60)
GFR calc non Af Amer: >60 mL/min (ref >60)
Glucose, Bld: 82 mg/dL (ref 70–99)
Potassium: 3.6 mmol/L (ref 3.5–5.1)
Sodium: 156 mmol/L — ABNORMAL HIGH (ref 135–145)

## 2019-12-19 MED ORDER — PROPRANOLOL HCL 20 MG/5ML PO SOLN
40.0000 mg | Freq: Two times a day (BID) | ORAL | Status: DC
  Administered 2019-12-19 – 2019-12-21 (×5): 40 mg
  Filled 2019-12-19 (×5): qty 10

## 2019-12-19 MED ORDER — FREE WATER
200.0000 mL | Freq: Three times a day (TID) | Status: DC
  Administered 2019-12-19 – 2019-12-20 (×4): 200 mL
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
## 2019-12-19 NOTE — Progress Notes (Addendum)
Patient ID: Clinton [name], male   DOB: [birth year], 24 y.o.   MRN: [id number] 5 Days Post-Op   Subjective: On HTC  ROS negative except as listed above. Objective: Vital signs in last 24 hours: Temp:  [99 F (37.2 C)-99.7 F (37.6 C)] 99.6 F (37.6 C) (05/11 0800) Pulse Rate:  [94-161] 127 (05/11 0803) Resp:  [17-34] 26 (05/11 0803) BP: (96-157)/(62-91) 119/66 (05/11 0700) SpO2:  [92 %-100 %] 96 % (05/11 0803) FiO2 (%):  [28 %] 28 % (05/11 0803) Weight:  [78.2 kg] 78.2 kg (05/11 0500) Last BM Date: 12/17/19  Intake/Output from previous day: 05/10 0701 - 05/11 0700 In: 1740 [NG/GT:1560] Out: 1850 [Urine:1400; Drains:450] Intake/Output this shift: Total I/O In: 65 [NG/GT:65] Out: 150 [Urine:150]  General appearance: no distress Neck: trach in place Resp: clear to auscultation bilaterally Cardio: regular rate and rhythm, S1, S2 normal, no murmur, click, rub or gallop GI: soft, NT, PEG in Place Extremities: PRAFO Neuro: opens eyes, moves RTUE, not F/C   Lab Results: CBC  Recent Labs    12/17/19 0519  WBC 13.6*  HGB 10.0*  HCT 32.8*  PLT 604*   BMET Recent Labs    12/17/19 0519 12/18/19 0642  NA 149* 153*  K 4.2 3.9  CL 107 110  CO2 29 28  GLUCOSE 107* 108*  BUN 21* 29*  CREATININE 0.65 0.75  CALCIUM 10.6* 10.2   PT/INR No results for input(s): LABPROT, INR in the last 72 hours. ABG No results for input(s): PHART, HCO3 in the last 72 hours.  Invalid input(s): PCO2, PO2  Studies/Results: No results found.  Anti-infectives: Anti-infectives (From admission, onward)   Start     Dose/Rate Route Frequency Ordered Stop   12/12/19 1945  vancomycin (VANCOREADY) IVPB 1250 mg/250 mL  Status:  Discontinued     1,250 mg 166.7 mL/hr over 90 Minutes Intravenous Every 8 hours 12/12/19 1930 12/14/19 1115   12/10/19 1200  ceFEPIme (MAXIPIME) 2 g in sodium chloride 0.9 % 100 mL IVPB  Status:  Discontinued     2 g 200 mL/hr over 30 Minutes Intravenous Every 8 hours  12/10/19 0814 12/12/19 1414   11/30/19 1830  ceFAZolin (ANCEF) IVPB 2g/100 mL premix     2 g 200 mL/hr over 30 Minutes Intravenous Every 8 hours 11/30/19 1726 12/01/19 1521   11/30/19 1545  vancomycin (VANCOCIN) powder  Status:  Discontinued       As needed 11/30/19 1546 11/30/19 1649   11/30/19 1430  ceFAZolin (ANCEF) IVPB 2g/100 mL premix  Status:  Discontinued     2 g 200 mL/hr over 30 Minutes Intravenous On call to O.R. 11/30/19 1134 11/30/19 1654   11/29/19 2215  ceFAZolin (ANCEF) IVPB 2g/100 mL premix     2 g 200 mL/hr over 30 Minutes Intravenous  Once 11/29/19 2210 11/29/19 2317      Assessment/Plan: PHBC  SDH, IPH- NSGY c/s (Dr. Ronnald Ramp), on home AED regimen,EEGnegative, repeat head CT 4/23with blossoming of ICH, neuro exam has improved somewhat. On valproate, propranolol - per NS Scattered abrasions- wound care VDRF- S/P trach 5/7, continue HTC (now 28%) L comminuted tib/fib fx-s/p IMN,TDWB CV - tachy, increase propranolol Alcohol abuse- SW c/s for SBIRTonceoff the vent, thiamine, folate FEN - s/p PEG 5/7, TF ABL anemia -monitor VTE - SCDs,LMWH Dispo - 4NP, plan SNF, continue therapies   LOS: 20 days    Georganna Skeans, MD, MPH, FACS Trauma & General Surgery Use [email or web address] to contact on call provider  12/19/2019 
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
## 2019-12-20 LAB — GLUCOSE, CAPILLARY
Glucose-Capillary: 104 mg/dL — ABNORMAL HIGH (ref 70–99)
Glucose-Capillary: 104 mg/dL — ABNORMAL HIGH (ref 70–99)
Glucose-Capillary: 105 mg/dL — ABNORMAL HIGH (ref 70–99)
Glucose-Capillary: 112 mg/dL — ABNORMAL HIGH (ref 70–99)
Glucose-Capillary: 114 mg/dL — ABNORMAL HIGH (ref 70–99)
Glucose-Capillary: 128 mg/dL — ABNORMAL HIGH (ref 70–99)

## 2019-12-20 LAB — BASIC METABOLIC PANEL
Anion gap: 12 (ref 5–15)
BUN: 31 mg/dL — ABNORMAL HIGH (ref 6–20)
CO2: 30 mmol/L (ref 22–32)
Calcium: 10.5 mg/dL — ABNORMAL HIGH (ref 8.9–10.3)
Chloride: 115 mmol/L — ABNORMAL HIGH (ref 98–111)
Creatinine, Ser: 0.81 mg/dL (ref 0.61–1.24)
GFR calc Af Amer: >60 mL/min (ref >60)
GFR calc non Af Amer: >60 mL/min (ref >60)
Glucose, Bld: 132 mg/dL — ABNORMAL HIGH (ref 70–99)
Potassium: 3.7 mmol/L (ref 3.5–5.1)
Sodium: 157 mmol/L — ABNORMAL HIGH (ref 135–145)

## 2019-12-20 MED ORDER — FREE WATER
250.0000 mL | Status: DC
  Administered 2019-12-20 – 2019-12-21 (×6): 250 mL

## 2019-12-20 MED ORDER — POLYETHYLENE GLYCOL 3350 17 G PO PACK
17.0000 g | PACK | Freq: Every day | ORAL | Status: DC
  Administered 2019-12-28 – 2019-12-31 (×2): 17 g via ORAL
  Filled 2019-12-20 (×9): qty 1

## 2019-12-20 NOTE — Progress Notes (Signed)
Trauma/Critical Care Follow Up Note  Subjective:    Overnight Issues:   Objective:  Vital signs for last 24 hours: Temp:  [99.6 F (37.6 C)-100.8 F (38.2 C)] 100 F (37.8 C) (05/12 1200) Pulse Rate:  [100-160] 115 (05/12 1400) Resp:  [13-36] 22 (05/12 1400) BP: (108-139)/(64-90) 119/68 (05/12 1400) SpO2:  [90 %-99 %] 93 % (05/12 1400) FiO2 (%):  [21 %-28 %] 21 % (05/12 1228) Weight:  [78.6 kg] 78.6 kg (05/12 0500)  Hemodynamic parameters for last 24 hours:    Intake/Output from previous day: 05/11 0701 - 05/12 0700 In: 2360 [NG/GT:2360] Out: 2175 [Urine:2175]  Intake/Output this shift: Total I/O In: 734.3 [NG/GT:734.3] Out: 400 [Urine:400]  Vent settings for last 24 hours: FiO2 (%):  [21 %-28 %] 21 %  Physical Exam:  Gen: comfortable, no distress Neuro: muscle flicker when asked to wiggle toes today HEENT: PERRL Neck: supple CV: tachycardic, stable Pulm: unlabored breathing on TC Abd: soft, NT GU: clear yellow urine Extr: wwp, no edema   Results for orders placed or performed during the hospital encounter of 11/29/19 (from the past 24 hour(s))  Glucose, capillary     Status: Abnormal   Collection Time: 12/19/19  3:44 PM  Result Value Ref Range   Glucose-Capillary 115 (H) 70 - 99 mg/dL   Comment 1 Notify RN    Comment 2 Document in Chart   Glucose, capillary     Status: Abnormal   Collection Time: 12/19/19  8:01 PM  Result Value Ref Range   Glucose-Capillary 113 (H) 70 - 99 mg/dL  Glucose, capillary     Status: Abnormal   Collection Time: 12/19/19 11:50 PM  Result Value Ref Range   Glucose-Capillary 120 (H) 70 - 99 mg/dL  Glucose, capillary     Status: Abnormal   Collection Time: 12/20/19  3:51 AM  Result Value Ref Range   Glucose-Capillary 128 (H) 70 - 99 mg/dL  Basic metabolic panel     Status: Abnormal   Collection Time: 12/20/19  4:12 AM  Result Value Ref Range   Sodium 157 (H) 135 - 145 mmol/L   Potassium 3.7 3.5 - 5.1 mmol/L   Chloride  115 (H) 98 - 111 mmol/L   CO2 30 22 - 32 mmol/L   Glucose, Bld 132 (H) 70 - 99 mg/dL   BUN 31 (H) 6 - 20 mg/dL   Creatinine, Ser 0.81 0.61 - 1.24 mg/dL   Calcium 10.5 (H) 8.9 - 10.3 mg/dL   GFR calc non Af Amer >60 >60 mL/min   GFR calc Af Amer >60 >60 mL/min   Anion gap 12 5 - 15  Glucose, capillary     Status: Abnormal   Collection Time: 12/20/19  8:08 AM  Result Value Ref Range   Glucose-Capillary 114 (H) 70 - 99 mg/dL   Comment 1 Notify RN    Comment 2 Document in Chart   Glucose, capillary     Status: Abnormal   Collection Time: 12/20/19 11:48 AM  Result Value Ref Range   Glucose-Capillary 105 (H) 70 - 99 mg/dL   Comment 1 Notify RN    Comment 2 Document in Chart     Assessment & Plan: The plan of care was discussed with the bedside nurse for the day, who is in agreement with this plan and no additional concerns were raised.   Present on Admission: . ICH (intracerebral hemorrhage) ([place])    LOS: 21 days   Additional comments:I reviewed the patient's  new clinical lab test results.   and I reviewed the patients new imaging test results.    PHBC  SDH, IPH- NSGY c/s (Dr. Ronnald Ramp), on home AED regimen,EEGnegative, repeat head CT 4/23with blossoming of ICH, neuro exam has improved somewhat. On valproate, propranolol - per NS Scattered abrasions- wound care VDRF- trach 5/7, cont TC L comminuted tib/fib fx-s/p IMN,TDWB CV - tachy, increase propranolol Alcohol abuse- SW c/s for SBIRTonceoff the vent, thiamine, folate FEN - s/p PEG 5/7, TF ABL anemia -monitor VTE - SCDs,LMWH Dispo - 4NP when bed available, plan SNF, continue therapies     Jesusita Oka, MD Trauma & General Surgery Please use [email or web address] to contact on call provider  12/20/2019  *Care during the described time interval was provided by me. I have reviewed this patient's available data, including medical history, events of note, physical examination and test results as part of my evaluation.
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
## 2019-12-21 LAB — GLUCOSE, CAPILLARY
Glucose-Capillary: 111 mg/dL — ABNORMAL HIGH (ref 70–99)
Glucose-Capillary: 112 mg/dL — ABNORMAL HIGH (ref 70–99)
Glucose-Capillary: 116 mg/dL — ABNORMAL HIGH (ref 70–99)
Glucose-Capillary: 119 mg/dL — ABNORMAL HIGH (ref 70–99)
Glucose-Capillary: 121 mg/dL — ABNORMAL HIGH (ref 70–99)
Glucose-Capillary: 122 mg/dL — ABNORMAL HIGH (ref 70–99)

## 2019-12-21 MED ORDER — PROPRANOLOL HCL 20 MG/5ML PO SOLN
60.0000 mg | Freq: Two times a day (BID) | ORAL | Status: DC
  Administered 2019-12-21 – 2019-12-23 (×4): 60 mg
  Filled 2019-12-21 (×6): qty 15

## 2019-12-21 MED ORDER — FREE WATER
300.0000 mL | Status: DC
  Administered 2019-12-21 – 2019-12-23 (×13): 300 mL

## 2019-12-21 NOTE — Progress Notes (Signed)
Patient ID: Clinton [name], male   DOB: 03-25-1996, 24 y.o.   MRN: [id number] 7 Days Post-Op   Subjective: Trach  ROS negative except as listed above. Objective: Vital signs in last 24 hours: Temp:  [98.9 F (37.2 C)-100 F (37.8 C)] 98.9 F (37.2 C) (05/13 0800) Pulse Rate:  [107-154] 116 (05/13 0700) Resp:  [17-36] 27 (05/13 0700) BP: (113-133)/(66-97) 113/68 (05/13 0700) SpO2:  [91 %-96 %] 94 % (05/13 0700) FiO2 (%):  [21 %] 21 % (05/13 0219) Weight:  [76.8 kg] 76.8 kg (05/13 0500) Last BM Date: 12/20/19  Intake/Output from previous day: 05/12 0701 - 05/13 0700 In: 2274.3 [NG/GT:2274.3] Out: 950 [Urine:950] Intake/Output this shift: Total I/O In: 750 [NG/GT:750] Out: -   General appearance: no distress Neck: trach in place Resp: rhonchi, better after cough and suctioning Cardio: regular rate and rhythm, S1, S2 normal, no murmur, click, rub or gallop GI: soft, NT, PEG in place Extremities: PRAFO Neuro: awake, spont moving RUE, not F/C  Lab Results: CBC  No results for input(s): WBC, HGB, HCT, PLT in the last 72 hours. BMET Recent Labs    12/19/19 0620 12/20/19 0412  NA 156* 157*  K 3.6 3.7  CL 112* 115*  CO2 31 30  GLUCOSE 82 132*  BUN 31* 31*  CREATININE 0.80 0.81  CALCIUM 10.4* 10.5*   PT/INR No results for input(s): LABPROT, INR in the last 72 hours. ABG No results for input(s): PHART, HCO3 in the last 72 hours.  Invalid input(s): PCO2, PO2  Studies/Results: No results found.  Anti-infectives: Anti-infectives (From admission, onward)   Start     Dose/Rate Route Frequency Ordered Stop   12/12/19 1945  vancomycin (VANCOREADY) IVPB 1250 mg/250 mL  Status:  Discontinued     1,250 mg 166.7 mL/hr over 90 Minutes Intravenous Every 8 hours 12/12/19 1930 12/14/19 1115   12/10/19 1200  ceFEPIme (MAXIPIME) 2 g in sodium chloride 0.9 % 100 mL IVPB  Status:  Discontinued     2 g 200 mL/hr over 30 Minutes Intravenous Every 8 hours 12/10/19 0814 12/12/19  1414   11/30/19 1830  ceFAZolin (ANCEF) IVPB 2g/100 mL premix     2 g 200 mL/hr over 30 Minutes Intravenous Every 8 hours 11/30/19 1726 12/01/19 1521   11/30/19 1545  vancomycin (VANCOCIN) powder  Status:  Discontinued       As needed 11/30/19 1546 11/30/19 1649   11/30/19 1430  ceFAZolin (ANCEF) IVPB 2g/100 mL premix  Status:  Discontinued     2 g 200 mL/hr over 30 Minutes Intravenous On call to O.R. 11/30/19 1134 11/30/19 1654   11/29/19 2215  ceFAZolin (ANCEF) IVPB 2g/100 mL premix     2 g 200 mL/hr over 30 Minutes Intravenous  Once 11/29/19 2210 11/29/19 2317      Assessment/Plan: PHBC  SDH, IPH- NSGY c/s (Dr. Ronnald Ramp), on home AED regimen,EEGnegative, repeat head CT 4/23with blossoming of ICH, neuro exam has improved somewhat. On valproate, propranolol - per NS Scattered abrasions- wound care VDRF- trach 5/7, cont TC L comminuted tib/fib fx-s/p IMN,TDWB CV - tachy, increase propranolol Alcohol abuse- SW c/s for SBIRTonceoff the vent, thiamine, folate FEN - s/p PEG 5/7, TF, increase free water for hypernatremia ABL anemia -monitor VTE - SCDs,LMWH Dispo - 4NP when bed available, plan SNF, continue therapies including PMV by ST      LOS: 22 days    Georganna Skeans, MD, MPH, FACS Trauma & General Surgery Use [email or web address] to contact on  call provider  12/21/2019
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
## 2019-12-21 NOTE — TOC Progression Note (Signed)
Transition of Care [facility]) - Progression Note    Patient Details  Name: Clinton [name] MRN: [id number] Date of Birth: Jun 03, 1996  [facility]) CM/SW Contact  [name], RN Phone Number: 12/21/2019, 3:59 PM  Clinical Narrative:   Pt stable on trach collar.  FL2 initiated and faxed out to area facilities for bed search.  Expect difficult placement due to trach being < 75 days old, TBI, and extensive care needs.  Will provide updates as available.  PASSR pending.      Expected Discharge Plan: [place] Barriers to Discharge: Continued Medical Work up  Expected Discharge Plan and Services Expected Discharge Plan: [place]   Discharge Planning Services: CM Consult   Living arrangements for the past 2 months: Single Family Home                                       Social Determinants of Health (SDOH) Interventions    Readmission Risk Interventions No flowsheet data found.  [name], RN, BSN  Trauma/Neuro ICU Case Manager [phone]
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
## 2019-12-22 DIAGNOSIS — L8990 Pressure ulcer of unspecified site, unspecified stage: Secondary | ICD-10-CM | POA: Insufficient documentation

## 2019-12-22 LAB — GLUCOSE, CAPILLARY
Glucose-Capillary: 106 mg/dL — ABNORMAL HIGH (ref 70–99)
Glucose-Capillary: 109 mg/dL — ABNORMAL HIGH (ref 70–99)
Glucose-Capillary: 111 mg/dL — ABNORMAL HIGH (ref 70–99)
Glucose-Capillary: 114 mg/dL — ABNORMAL HIGH (ref 70–99)
Glucose-Capillary: 117 mg/dL — ABNORMAL HIGH (ref 70–99)
Glucose-Capillary: 97 mg/dL (ref 70–99)

## 2019-12-22 LAB — BASIC METABOLIC PANEL
Anion gap: 9 (ref 5–15)
BUN: 31 mg/dL — ABNORMAL HIGH (ref 6–20)
CO2: 30 mmol/L (ref 22–32)
Calcium: 10.1 mg/dL (ref 8.9–10.3)
Chloride: 115 mmol/L — ABNORMAL HIGH (ref 98–111)
Creatinine, Ser: 0.82 mg/dL (ref 0.61–1.24)
GFR calc Af Amer: >60 mL/min (ref >60)
GFR calc non Af Amer: >60 mL/min (ref >60)
Glucose, Bld: 131 mg/dL — ABNORMAL HIGH (ref 70–99)
Potassium: 3.6 mmol/L (ref 3.5–5.1)
Sodium: 154 mmol/L — ABNORMAL HIGH (ref 135–145)

## 2019-12-22 MED ORDER — DEXTROSE 50 % IV SOLN: 12.5000 g | Freq: Once | INTRAVENOUS | Status: DC
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
## 2019-12-22 NOTE — Plan of Care (Signed)
  Problem: Respiratory: Goal: Will regain and/or maintain adequate ventilation Outcome: Progressing

## 2019-12-22 NOTE — Progress Notes (Signed)
Patient ID: Clinton [name], male   DOB: 14-Jan-1996, 24 y.o.   MRN: [id number] 8 Days Post-Op   Subjective: Trach  ROS negative except as listed above. Objective: Vital signs in last 24 hours: Temp:  [98.3 F (36.8 C)-101.2 F (38.4 C)] 100.2 F (37.9 C) (05/14 0400) Pulse Rate:  [98-153] 130 (05/14 0738) Resp:  [16-30] 18 (05/14 0738) BP: (107-142)/(61-96) 142/96 (05/14 0738) SpO2:  [92 %-100 %] 96 % (05/14 0738) FiO2 (%):  [21 %] 21 % (05/14 0738) Last BM Date: 12/20/19  Intake/Output from previous day: 05/13 0701 - 05/14 0700 In: 3745 [NG/GT:3745] Out: 750 [Urine:400; Drains:350] Intake/Output this shift: No intake/output data recorded.  General appearance: no distress Neck: trach with small pressure wound Cardio: regular rate and rhythm GI: soft, PEG in place Extremities: PRAFO Neuro: purposeful RUE, not F/C consistently  Lab Results: CBC  No results for input(s): WBC, HGB, HCT, PLT in the last 72 hours. BMET Recent Labs    12/20/19 0412 12/22/19 0452  NA 157* 154*  K 3.7 3.6  CL 115* 115*  CO2 30 30  GLUCOSE 132* 131*  BUN 31* 31*  CREATININE 0.81 0.82  CALCIUM 10.5* 10.1   PT/INR No results for input(s): LABPROT, INR in the last 72 hours. ABG No results for input(s): PHART, HCO3 in the last 72 hours.  Invalid input(s): PCO2, PO2  Studies/Results: No results found.  Anti-infectives: Anti-infectives (From admission, onward)   Start     Dose/Rate Route Frequency Ordered Stop   12/12/19 1945  vancomycin (VANCOREADY) IVPB 1250 mg/250 mL  Status:  Discontinued     1,250 mg 166.7 mL/hr over 90 Minutes Intravenous Every 8 hours 12/12/19 1930 12/14/19 1115   12/10/19 1200  ceFEPIme (MAXIPIME) 2 g in sodium chloride 0.9 % 100 mL IVPB  Status:  Discontinued     2 g 200 mL/hr over 30 Minutes Intravenous Every 8 hours 12/10/19 0814 12/12/19 1414   11/30/19 1830  ceFAZolin (ANCEF) IVPB 2g/100 mL premix     2 g 200 mL/hr over 30 Minutes Intravenous Every 8  hours 11/30/19 1726 12/01/19 1521   11/30/19 1545  vancomycin (VANCOCIN) powder  Status:  Discontinued       As needed 11/30/19 1546 11/30/19 1649   11/30/19 1430  ceFAZolin (ANCEF) IVPB 2g/100 mL premix  Status:  Discontinued     2 g 200 mL/hr over 30 Minutes Intravenous On call to O.R. 11/30/19 1134 11/30/19 1654   11/29/19 2215  ceFAZolin (ANCEF) IVPB 2g/100 mL premix     2 g 200 mL/hr over 30 Minutes Intravenous  Once 11/29/19 2210 11/29/19 2317      Assessment/Plan: PHBC  SDH, IPH- NSGY c/s (Dr. Ronnald Ramp), on home AED regimen,EEGnegative, repeat head CT 4/23with blossoming of ICH, neuro exam has improved somewhat. On valproate, propranolol  Scattered abrasions- wound care VDRF- trach 5/7, cont TC L comminuted tib/fib fx-s/p IMN,TDWB CV - tachy, plan to increase propranolol again 5/15 Alcohol abuse- SW c/s for SBIRTonceable FEN - s/p PEG 5/7, TF, increased free water for hypernatremia 5/13 - improving some but may need to increase again 5/15 ABL anemia -monitor VTE - SCDs,LMWH Dispo - 4NP when bed available, plan SNF, continue therapies including PMV by ST     LOS: 23 days    Georganna Skeans, MD, MPH, FACS Trauma & General Surgery Use [email or web address] to contact on call provider  12/22/2019

## 2019-12-23 LAB — BASIC METABOLIC PANEL
Anion gap: 12 (ref 5–15)
BUN: 31 mg/dL — ABNORMAL HIGH (ref 6–20)
CO2: 28 mmol/L (ref 22–32)
Calcium: 10 mg/dL (ref 8.9–10.3)
Chloride: 114 mmol/L — ABNORMAL HIGH (ref 98–111)
Creatinine, Ser: 0.75 mg/dL (ref 0.61–1.24)
GFR calc Af Amer: >60 mL/min (ref >60)
GFR calc non Af Amer: >60 mL/min (ref >60)
Glucose, Bld: 113 mg/dL — ABNORMAL HIGH (ref 70–99)
Potassium: 3.6 mmol/L (ref 3.5–5.1)
Sodium: 154 mmol/L — ABNORMAL HIGH (ref 135–145)

## 2019-12-23 LAB — GLUCOSE, CAPILLARY
Glucose-Capillary: 100 mg/dL — ABNORMAL HIGH (ref 70–99)
Glucose-Capillary: 112 mg/dL — ABNORMAL HIGH (ref 70–99)
Glucose-Capillary: 136 mg/dL — ABNORMAL HIGH (ref 70–99)
Glucose-Capillary: 111 mg/dL — ABNORMAL HIGH (ref 70–99)
Glucose-Capillary: 113 mg/dL — ABNORMAL HIGH (ref 70–99)
Glucose-Capillary: 111 mg/dL — ABNORMAL HIGH (ref 70–99)

## 2019-12-23 MED ORDER — PROPRANOLOL HCL 20 MG/5ML PO SOLN
80.0000 mg | Freq: Three times a day (TID) | ORAL | Status: DC
  Administered 2019-12-23 – 2020-01-01 (×27): 80 mg
  Filled 2019-12-23 (×29): qty 20

## 2019-12-23 MED ORDER — FREE WATER
400.0000 mL | Status: DC
  Administered 2019-12-23 – 2020-01-01 (×71): 400 mL

## 2019-12-23 MED ORDER — FREE WATER: 400.0000 mL | Status: DC

## 2019-12-23 NOTE — Progress Notes (Signed)
Palliative:   Noted plan to continue full scope care, seeking SNF placement.  Palliative team will sign off for now.  Please reconsult if further assistance needed.   Clinton [name], AGNP-C Palliative Medicine  Please call Palliative Medicine team phone with any questions [phone]. For individual providers please see AMION.  No charge

## 2019-12-23 NOTE — Progress Notes (Signed)
Pt arrived to 4NP12 from 4NICU.

## 2019-12-23 NOTE — Progress Notes (Signed)
Trauma/Critical Care Follow Up Note  Subjective:    Overnight Issues:   Objective:  Vital signs for last 24 hours: Temp:  [98.6 F (37 C)-99.8 F (37.7 C)] 99.3 F (37.4 C) (05/15 1018) Pulse Rate:  [96-133] 125 (05/15 1018) Resp:  [17-34] 34 (05/15 1018) BP: (102-147)/(60-107) 127/91 (05/15 1018) SpO2:  [92 %-98 %] 95 % (05/15 1018) FiO2 (%):  [21 %-28 %] 28 % (05/15 1018) Weight:  [76 kg] 76 kg (05/15 0500)  Hemodynamic parameters for last 24 hours:    Intake/Output from previous day: 05/14 0701 - 05/15 0700 In: 3295 [NG/GT:3295] Out: 500 [Urine:500]  Intake/Output this shift: Total I/O In: 560 [NG/GT:560] Out: 450 [Urine:450]  Vent settings for last 24 hours: FiO2 (%):  [21 %-28 %] 28 %  Physical Exam:  Gen: comfortable, no distress Neuro: does not respond to verbal or painful stimulus, not following commands HEENT: trach Neck: supple CV: RRR Pulm: unlabored breathing Abd: soft, NT GU: clear yellow urine Extr: wwp, no edema   Results for orders placed or performed during the hospital encounter of 11/29/19 (from the past 24 hour(s))  Glucose, capillary     Status: None   Collection Time: 12/22/19  4:10 PM  Result Value Ref Range   Glucose-Capillary 97 70 - 99 mg/dL  Glucose, capillary     Status: Abnormal   Collection Time: 12/22/19  7:33 PM  Result Value Ref Range   Glucose-Capillary 114 (H) 70 - 99 mg/dL  Glucose, capillary     Status: Abnormal   Collection Time: 12/22/19 11:44 PM  Result Value Ref Range   Glucose-Capillary 117 (H) 70 - 99 mg/dL  Basic metabolic panel     Status: Abnormal   Collection Time: 12/23/19  2:27 AM  Result Value Ref Range   Sodium 154 (H) 135 - 145 mmol/L   Potassium 3.6 3.5 - 5.1 mmol/L   Chloride 114 (H) 98 - 111 mmol/L   CO2 28 22 - 32 mmol/L   Glucose, Bld 113 (H) 70 - 99 mg/dL   BUN 31 (H) 6 - 20 mg/dL   Creatinine, Ser 0.75 0.61 - 1.24 mg/dL   Calcium 10.0 8.9 - 10.3 mg/dL   GFR calc non Af Amer >60 >60  mL/min   GFR calc Af Amer >60 >60 mL/min   Anion gap 12 5 - 15  Glucose, capillary     Status: Abnormal   Collection Time: 12/23/19  6:02 AM  Result Value Ref Range   Glucose-Capillary 100 (H) 70 - 99 mg/dL  Glucose, capillary     Status: Abnormal   Collection Time: 12/23/19  7:23 AM  Result Value Ref Range   Glucose-Capillary 112 (H) 70 - 99 mg/dL  Glucose, capillary     Status: Abnormal   Collection Time: 12/23/19 11:57 AM  Result Value Ref Range   Glucose-Capillary 136 (H) 70 - 99 mg/dL    Assessment & Plan: The plan of care was discussed with the bedside nurse for the day, who is in agreement with this plan and no additional concerns were raised.   Present on Admission: . ICH (intracerebral hemorrhage) ([place])    LOS: 24 days   Additional comments:I reviewed the patient's new clinical lab test results.   and I reviewed the patients new imaging test results.    PHBC  SDH, IPH- NSGY c/s (Dr. Ronnald Ramp), on home AED regimen,EEGnegative, repeat head CT 4/23with blossoming of ICH, neuro exam has improved somewhat. On valproate, propranolol  Scattered abrasions- wound care VDRF- trach 5/7, cont TC L comminuted tib/fib fx-s/p IMN,TDWB CV- tachy, increase propranolol again 5/15 Alcohol abuse- SW c/s for SBIRTonceable FEN - s/p PEG 5/7, TF, increased free water for hypernatremia today ABL anemia -monitor VTE - SCDs,LMWH Dispo -4NP when bed available, plan SNF, continue therapies including PMV by ST  Jesusita Oka, MD Trauma & General Surgery Please use [email or web address] to contact on call provider  12/23/2019  *Care during the described time interval was provided by me. I have reviewed this patient's available data, including medical history, events of note, physical examination and test results as part of my evaluation.

## 2019-12-24 LAB — GLUCOSE, CAPILLARY
Glucose-Capillary: 110 mg/dL — ABNORMAL HIGH (ref 70–99)
Glucose-Capillary: 110 mg/dL — ABNORMAL HIGH (ref 70–99)
Glucose-Capillary: 115 mg/dL — ABNORMAL HIGH (ref 70–99)
Glucose-Capillary: 117 mg/dL — ABNORMAL HIGH (ref 70–99)
Glucose-Capillary: 94 mg/dL (ref 70–99)
Glucose-Capillary: 95 mg/dL (ref 70–99)
Glucose-Capillary: 99 mg/dL (ref 70–99)

## 2019-12-24 NOTE — Progress Notes (Signed)
10 Days Post-Op   Subjective/Chief Complaint: non verbal No significant events    Objective: Vital signs in last 24 hours: Temp:  [98.1 F (36.7 C)-99.9 F (37.7 C)] 98.7 F (37.1 C) (05/16 0747) Pulse Rate:  [82-141] 82 (05/16 0804) Resp:  [21-41] 29 (05/16 0804) BP: (111-150)/(59-91) 111/68 (05/16 0747) SpO2:  [92 %-99 %] 98 % (05/16 0804) FiO2 (%):  [21 %-28 %] 21 % (05/16 0804) Weight:  [76.4 kg] 76.4 kg (05/16 0625) Last BM Date: 12/23/19  Intake/Output from previous day: 05/15 0701 - 05/16 0700 In: 560 [NG/GT:560] Out: 1600 [Urine:1600] Intake/Output this shift: No intake/output data recorded.   Gen: comfortable, no distress Neuro: does not respond to verbal or painful stimulus, not following commands HEENT: trach Neck: supple CV: RRR Pulm: unlabored breathing Abd: soft, NT GU: clear yellow urine Extr: wwp, no edema Lab Results:  No results for input(s): WBC, HGB, HCT, PLT in the last 72 hours. BMET Recent Labs    12/22/19 0452 12/23/19 0227  NA 154* 154*  K 3.6 3.6  CL 115* 114*  CO2 30 28  GLUCOSE 131* 113*  BUN 31* 31*  CREATININE 0.82 0.75  CALCIUM 10.1 10.0   PT/INR No results for input(s): LABPROT, INR in the last 72 hours. ABG No results for input(s): PHART, HCO3 in the last 72 hours.  Invalid input(s): PCO2, PO2  Studies/Results: No results found.  Anti-infectives: Anti-infectives (From admission, onward)   Start     Dose/Rate Route Frequency Ordered Stop   12/12/19 1945  vancomycin (VANCOREADY) IVPB 1250 mg/250 mL  Status:  Discontinued     1,250 mg 166.7 mL/hr over 90 Minutes Intravenous Every 8 hours 12/12/19 1930 12/14/19 1115   12/10/19 1200  ceFEPIme (MAXIPIME) 2 g in sodium chloride 0.9 % 100 mL IVPB  Status:  Discontinued     2 g 200 mL/hr over 30 Minutes Intravenous Every 8 hours 12/10/19 0814 12/12/19 1414   11/30/19 1830  ceFAZolin (ANCEF) IVPB 2g/100 mL premix     2 g 200 mL/hr over 30 Minutes Intravenous Every 8  hours 11/30/19 1726 12/01/19 1521   11/30/19 1545  vancomycin (VANCOCIN) powder  Status:  Discontinued       As needed 11/30/19 1546 11/30/19 1649   11/30/19 1430  ceFAZolin (ANCEF) IVPB 2g/100 mL premix  Status:  Discontinued     2 g 200 mL/hr over 30 Minutes Intravenous On call to O.R. 11/30/19 1134 11/30/19 1654   11/29/19 2215  ceFAZolin (ANCEF) IVPB 2g/100 mL premix     2 g 200 mL/hr over 30 Minutes Intravenous  Once 11/29/19 2210 11/29/19 2317      Assessment/Plan: PHBC  SDH, IPH- NSGY c/s (Dr. Ronnald Ramp), on home AED regimen,EEGnegative, repeat head CT 4/23with blossoming of ICH, neuro exam has improved somewhat. On valproate, propranolol  Scattered abrasions- wound care VDRF- trach 5/7, cont TC L comminuted tib/fib fx-s/p IMN,TDWB CV- tachy,increase propranololagain 5/15 Alcohol abuse- SW c/s for SBIRTonceable FEN - s/p PEG 5/7, TF, increasedfree water for hypernatremia today ABL anemia -monitor VTE - SCDs,LMWH Dispo - plan SNF, continue therapies including PMV by ST   LOS: 25 days    Turner Daniels MD  12/24/2019

## 2019-12-25 LAB — GLUCOSE, CAPILLARY
Glucose-Capillary: 105 mg/dL — ABNORMAL HIGH (ref 70–99)
Glucose-Capillary: 101 mg/dL — ABNORMAL HIGH (ref 70–99)
Glucose-Capillary: 103 mg/dL — ABNORMAL HIGH (ref 70–99)
Glucose-Capillary: 104 mg/dL — ABNORMAL HIGH (ref 70–99)

## 2019-12-25 MED ORDER — PRO-STAT SUGAR FREE PO LIQD
30.0000 mL | Freq: Two times a day (BID) | ORAL | Status: DC
  Administered 2019-12-25 – 2020-01-01 (×14): 30 mL
  Filled 2019-12-25 (×15): qty 30

## 2019-12-25 MED ORDER — JEVITY 1.5 CAL/FIBER PO LIQD
1000.0000 mL | ORAL | Status: DC
  Administered 2019-12-25 – 2019-12-31 (×4): 1000 mL
  Filled 2019-12-25 (×14): qty 1000
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
## 2019-12-25 NOTE — Progress Notes (Signed)
2047 - Monitor showed ST elevation. EKG done. Showed accelerated junctional rhythm. On call provider notified. Magnesium lab ordered.

## 2019-12-25 NOTE — Progress Notes (Signed)
Patient ID: Clinton [name], male   DOB: 04-23-1996, 24 y.o.   MRN: [id number] 11 Days Post-Op   Subjective: trach ROS negative except as listed above. Objective: Vital signs in last 24 hours: Temp:  [97.4 F (36.3 C)-99 F (37.2 C)] 97.4 F (36.3 C) (05/17 0739) Pulse Rate:  [88-113] 99 (05/17 0818) Resp:  [20-40] 40 (05/17 0818) BP: (105-122)/(60-81) 122/78 (05/17 0739) SpO2:  [95 %-99 %] 95 % (05/17 0818) FiO2 (%):  [21 %] 21 % (05/17 0818) Last BM Date: 12/24/19  Intake/Output from previous day: 05/16 0701 - 05/17 0700 In: 2777.6 [I.V.:397.6; NG/GT:2380] Out: 1900 [Urine:1100; Drains:800] Intake/Output this shift: Total I/O In: -  Out: 350 [Urine:350]  General appearance: alert and no distress Resp: clear to auscultation bilaterally Cardio: regular rate and rhythm GI: soft, NT Extremities: calves soft neuro: F/C RUE, eyes open  Lab Results: CBC  No results for input(s): WBC, HGB, HCT, PLT in the last 72 hours. BMET Recent Labs    12/23/19 0227  NA 154*  K 3.6  CL 114*  CO2 28  GLUCOSE 113*  BUN 31*  CREATININE 0.75  CALCIUM 10.0   PT/INR No results for input(s): LABPROT, INR in the last 72 hours. ABG No results for input(s): PHART, HCO3 in the last 72 hours.  Invalid input(s): PCO2, PO2  Studies/Results: No results found.  Anti-infectives: Anti-infectives (From admission, onward)   Start     Dose/Rate Route Frequency Ordered Stop   12/12/19 1945  vancomycin (VANCOREADY) IVPB 1250 mg/250 mL  Status:  Discontinued     1,250 mg 166.7 mL/hr over 90 Minutes Intravenous Every 8 hours 12/12/19 1930 12/14/19 1115   12/10/19 1200  ceFEPIme (MAXIPIME) 2 g in sodium chloride 0.9 % 100 mL IVPB  Status:  Discontinued     2 g 200 mL/hr over 30 Minutes Intravenous Every 8 hours 12/10/19 0814 12/12/19 1414   11/30/19 1830  ceFAZolin (ANCEF) IVPB 2g/100 mL premix     2 g 200 mL/hr over 30 Minutes Intravenous Every 8 hours 11/30/19 1726 12/01/19 1521   11/30/19  1545  vancomycin (VANCOCIN) powder  Status:  Discontinued       As needed 11/30/19 1546 11/30/19 1649   11/30/19 1430  ceFAZolin (ANCEF) IVPB 2g/100 mL premix  Status:  Discontinued     2 g 200 mL/hr over 30 Minutes Intravenous On call to O.R. 11/30/19 1134 11/30/19 1654   11/29/19 2215  ceFAZolin (ANCEF) IVPB 2g/100 mL premix     2 g 200 mL/hr over 30 Minutes Intravenous  Once 11/29/19 2210 11/29/19 2317      Assessment/Plan: 10 Days Post-Op   Subjective/Chief Complaint: non verbal No significant events    Objective: Vital signs in last 24 hours: Temp:  [98.1 F (36.7 C)-99.9 F (37.7 C)] 98.7 F (37.1 C) (05/16 0747) Pulse Rate:  [82-141] 82 (05/16 0804) Resp:  [21-41] 29 (05/16 0804) BP: (111-150)/(59-91) 111/68 (05/16 0747) SpO2:  [92 %-99 %] 98 % (05/16 0804) FiO2 (%):  [21 %-28 %] 21 % (05/16 0804) Weight:  [76.4 kg] 76.4 kg (05/16 0625) Last BM Date: 12/23/19  Intake/Output from previous day: 05/15 0701 - 05/16 0700 In: 560 [NG/GT:560] Out: 1600 [Urine:1600] Intake/Output this shift: No intake/output data recorded.   Gen: comfortable, no distress Neuro: does not respond to verbal or painful stimulus, not following commands HEENT: trach Neck: supple CV: RRR Pulm: unlabored breathing Abd: soft, NT GU: clear yellow urine Extr: wwp, no edema Lab Results:  No results for input(s): WBC, HGB, HCT, PLT in the last 72 hours. BMET Recent Labs    12/22/19 0452 12/23/19 0227  NA 154* 154*  K 3.6 3.6  CL 115* 114*  CO2 30 28  GLUCOSE 131* 113*  BUN 31* 31*  CREATININE 0.82 0.75  CALCIUM 10.1 10.0   PT/INR No results for input(s): LABPROT, INR in the last 72 hours. ABG No results for input(s): PHART, HCO3 in the last 72 hours.  Invalid input(s): PCO2, PO2  Studies/Results: No results found.  Anti-infectives: Anti-infectives (From admission, onward)   Start     Dose/Rate Route Frequency Ordered Stop   12/12/19 1945  vancomycin (VANCOREADY) IVPB  1250 mg/250 mL  Status:  Discontinued     1,250 mg 166.7 mL/hr over 90 Minutes Intravenous Every 8 hours 12/12/19 1930 12/14/19 1115   12/10/19 1200  ceFEPIme (MAXIPIME) 2 g in sodium chloride 0.9 % 100 mL IVPB  Status:  Discontinued     2 g 200 mL/hr over 30 Minutes Intravenous Every 8 hours 12/10/19 0814 12/12/19 1414   11/30/19 1830  ceFAZolin (ANCEF) IVPB 2g/100 mL premix     2 g 200 mL/hr over 30 Minutes Intravenous Every 8 hours 11/30/19 1726 12/01/19 1521   11/30/19 1545  vancomycin (VANCOCIN) powder  Status:  Discontinued       As needed 11/30/19 1546 11/30/19 1649   11/30/19 1430  ceFAZolin (ANCEF) IVPB 2g/100 mL premix  Status:  Discontinued     2 g 200 mL/hr over 30 Minutes Intravenous On call to O.R. 11/30/19 1134 11/30/19 1654   11/29/19 2215  ceFAZolin (ANCEF) IVPB 2g/100 mL premix     2 g 200 mL/hr over 30 Minutes Intravenous  Once 11/29/19 2210 11/29/19 2317      Assessment/Plan: PHBC  SDH, IPH- NSGY c/s (Dr. Ronnald Ramp), on home AED regimen,EEGnegative, repeat head CT 4/23with blossoming of ICH, neuro exam has improved somewhat. On valproate, propranolol  Scattered abrasions- wound care VDRF- trach 5/7, cont TC L comminuted tib/fib fx-s/p IMN,TDWB CV- tachy,increase propranololagain 5/15 Alcohol abuse- SW c/s for SBIRTonceable FEN - s/p PEG 5/7, TF, increasedfree water for hypernatremia today ABL anemia -monitor VTE - SCDs,LMWH Dispo - plan SNF, continue therapies including PMV by ST   LOS: 25 days    Turner Daniels MD  12/24/2019  LOS: 66 days    Georganna Skeans, MD, MPH, FACS Trauma & General Surgery Use [email or web address] to contact on call provider  12/25/2019
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
## 2019-12-25 NOTE — Care Management (Signed)
PASSR requiring additional review; faxed clinical information to [facility] at [phone].  Received call from [facility] admissions; they are interested in patient.  Admissions coordinator at facility states that they do not have Pivot tube feeding at SNF; she provided a list of tube feeds that are available.    Fiber Source HN Isosource 1.5 Cal Diabetisource AC Isosource HN Nutren 1.5 Nutren 20 Novasource Renal Peptomen with Prebio Impact Peptide  Dietician Consult placed to assist with comparable tube feeding for SNF.   Pt still requiring restraint, and will need to be restraint free 24h prior to discharging to SNF.  Will verify with admissions if trach needs to be uncuffed and greater than 30 days.    I updated pt's sister on [facility] being interested in patient; she is encouraged that facility is close to home.  Will follow with updates as available.    [name], RN, BSN  Trauma/Neuro ICU Case Manager [phone]

## 2019-12-26 LAB — GLUCOSE, CAPILLARY
Glucose-Capillary: 101 mg/dL — ABNORMAL HIGH (ref 70–99)
Glucose-Capillary: 104 mg/dL — ABNORMAL HIGH (ref 70–99)
Glucose-Capillary: 108 mg/dL — ABNORMAL HIGH (ref 70–99)
Glucose-Capillary: 111 mg/dL — ABNORMAL HIGH (ref 70–99)
Glucose-Capillary: 113 mg/dL — ABNORMAL HIGH (ref 70–99)
Glucose-Capillary: 87 mg/dL (ref 70–99)
Glucose-Capillary: 88 mg/dL (ref 70–99)
Glucose-Capillary: 92 mg/dL (ref 70–99)

## 2019-12-26 LAB — BASIC METABOLIC PANEL
Anion gap: 12 (ref 5–15)
BUN: 20 mg/dL (ref 6–20)
CO2: 25 mmol/L (ref 22–32)
Calcium: 9.7 mg/dL (ref 8.9–10.3)
Chloride: 108 mmol/L (ref 98–111)
Creatinine, Ser: 0.59 mg/dL — ABNORMAL LOW (ref 0.61–1.24)
GFR calc Af Amer: >60 mL/min (ref >60)
GFR calc non Af Amer: >60 mL/min (ref >60)
Glucose, Bld: 104 mg/dL — ABNORMAL HIGH (ref 70–99)
Potassium: 4 mmol/L (ref 3.5–5.1)
Sodium: 145 mmol/L (ref 135–145)

## 2019-12-26 LAB — MAGNESIUM: Magnesium: 2.1 mg/dL (ref 1.7–2.4)

## 2019-12-26 MED ORDER — LORAZEPAM 2 MG/ML IJ SOLN: 1.0000 mg | Freq: Four times a day (QID) | INTRAMUSCULAR | Status: DC | PRN

## 2019-12-26 NOTE — Progress Notes (Signed)
12 Days Post-Op  Subjective: CC: Nursing notes reviewed. No changes overnight   Objective: Vital signs in last 24 hours: Temp:  [98 F (36.7 C)-98.7 F (37.1 C)] 98.7 F (37.1 C) (05/18 0733) Pulse Rate:  [82-109] 109 (05/18 0733) Resp:  [21-35] 21 (05/18 0857) BP: (116-129)/(66-93) 116/93 (05/18 0857) SpO2:  [95 %-99 %] 98 % (05/18 0733) FiO2 (%):  [21 %] 21 % (05/18 0857) Weight:  [77.4 kg] 77.4 kg (05/18 0500) Last BM Date: 12/25/19  Intake/Output from previous day: 05/17 0701 - 05/18 0700 In: 674.8 [I.V.:89.8; NG/GT:585] Out: 1901 [Urine:1900; Stool:1] Intake/Output this shift: No intake/output data recorded.  PE: Gen: Alert, NAD Heart: Tachycardic, sinus tach on monitor Lungs: On TC. CTA b/l Abd: Soft, ND, no rigidity, +BS. PEG tube in place with abd binder over. TF's running Ext: Calves soft. No edema Neuro: Opens eyes to verbal command  Lab Results:  No results for input(s): WBC, HGB, HCT, PLT in the last 72 hours. BMET Recent Labs    12/26/19 0615  NA 145  K 4.0  CL 108  CO2 25  GLUCOSE 104*  BUN 20  CREATININE 0.59*  CALCIUM 9.7   PT/INR No results for input(s): LABPROT, INR in the last 72 hours. CMP     Component Value Date/Time   NA 145 12/26/2019 0615   K 4.0 12/26/2019 0615   CL 108 12/26/2019 0615   CO2 25 12/26/2019 0615   GLUCOSE 104 (H) 12/26/2019 0615   BUN 20 12/26/2019 0615   CREATININE 0.59 (L) 12/26/2019 0615   CALCIUM 9.7 12/26/2019 0615   PROT 7.1 11/29/2019 2210   ALBUMIN 4.1 11/29/2019 2210   AST 44 (H) 11/29/2019 2210   ALT 21 11/29/2019 2210   ALKPHOS 42 11/29/2019 2210   BILITOT 0.9 11/29/2019 2210   GFRNONAA >60 12/26/2019 0615   GFRAA >60 12/26/2019 0615   Lipase  No results found for: LIPASE     Studies/Results: No results found.  Anti-infectives: Anti-infectives (From admission, onward)   Start     Dose/Rate Route Frequency Ordered Stop   12/12/19 1945  vancomycin (VANCOREADY) IVPB 1250 mg/250 mL   Status:  Discontinued     1,250 mg 166.7 mL/hr over 90 Minutes Intravenous Every 8 hours 12/12/19 1930 12/14/19 1115   12/10/19 1200  ceFEPIme (MAXIPIME) 2 g in sodium chloride 0.9 % 100 mL IVPB  Status:  Discontinued     2 g 200 mL/hr over 30 Minutes Intravenous Every 8 hours 12/10/19 0814 12/12/19 1414   11/30/19 1830  ceFAZolin (ANCEF) IVPB 2g/100 mL premix     2 g 200 mL/hr over 30 Minutes Intravenous Every 8 hours 11/30/19 1726 12/01/19 1521   11/30/19 1545  vancomycin (VANCOCIN) powder  Status:  Discontinued       As needed 11/30/19 1546 11/30/19 1649   11/30/19 1430  ceFAZolin (ANCEF) IVPB 2g/100 mL premix  Status:  Discontinued     2 g 200 mL/hr over 30 Minutes Intravenous On call to O.R. 11/30/19 1134 11/30/19 1654   11/29/19 2215  ceFAZolin (ANCEF) IVPB 2g/100 mL premix     2 g 200 mL/hr over 30 Minutes Intravenous  Once 11/29/19 2210 11/29/19 2317       Assessment/Plan PHBC SDH, IPH- NSGY c/s (Dr. Ronnald Ramp), on home AED regimen,EEGnegative, repeat head CT 4/23with blossoming of ICH. Repeat CT 5/8 with new low desnisty subdural hydroma, increase in left subdural and decrease in parenchymal hemorrhage and SAH. Neuro exam has  improved somewhat. On valproate, propranolol  Scattered abrasions- wound care VDRF- trach 5/7, cont TC L comminuted tib/fib fx-s/p IMN,TDWB, PT/OT CV- tachy, propranololincreased 5/15 Alcohol abuse- SW c/s for SBIRTonceable FEN - s/p PEG 5/7, TF ABL anemia -stable 5/8  VTE - SCDs,LMWH ID - None currently  Dispo - plan SNF, continue therapies including PMV by ST. D/c restraints    LOS: 27 days    Jillyn Ledger , [facility] 12/26/2019, 9:00 AM Please see Amion for pager number during day hours 7:00am-4:30pm
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
## 2019-12-27 ENCOUNTER — Inpatient Hospital Stay (HOSPITAL_COMMUNITY): Payer: Medicaid Other

## 2019-12-27 LAB — GLUCOSE, CAPILLARY
Glucose-Capillary: 110 mg/dL — ABNORMAL HIGH (ref 70–99)
Glucose-Capillary: 107 mg/dL — ABNORMAL HIGH (ref 70–99)
Glucose-Capillary: 86 mg/dL (ref 70–99)
Glucose-Capillary: 102 mg/dL — ABNORMAL HIGH (ref 70–99)
Glucose-Capillary: 93 mg/dL (ref 70–99)
Glucose-Capillary: 113 mg/dL — ABNORMAL HIGH (ref 70–99)

## 2019-12-27 NOTE — Procedures (Signed)
Tracheostomy Change Note  Patient Details:   Name: [place] DOB: [birth year] MRN: [id number]    Airway Documentation:     Evaluation  O2 sats: stable throughout Complications: No apparent complications Patient did tolerate procedure well. Bilateral Breath Sounds: Clear, Diminished     RT x2 changed patient trach from #6 cuffed Shiley to #4 uncuffed Shiley per MD order. Positive color change noted by x2 RT and bilateral breath sounds heard. No complications and patient tolerated well. RT will continue to monitor.   Vernona Rieger 12/27/2019, 2:46 PM

## 2019-12-27 NOTE — Progress Notes (Signed)
Patient ID: Clinton [name], male   DOB: 1995-09-03, 24 y.o.   MRN: [id number] CT head reviewed, I think it looks better. Interhemispheric hypodensity seems smaller and L extra-axial fluid collection stable to smaller. BG hemorrhage better. No new recs. please call if we can help in any way

## 2019-12-27 NOTE — Progress Notes (Signed)
[facility] Progress Note  13 Days Post-Op  Subjective: CC-  Per RN patient squeezed her hand with his right hand earlier today.  Out of restraints >24 hours without any issues.  Objective: Vital signs in last 24 hours: Temp:  [98.3 F (36.8 C)-98.6 F (37 C)] 98.5 F (36.9 C) (05/19 0756) Pulse Rate:  [82-100] 82 (05/19 0825) Resp:  [16-30] 23 (05/19 0825) BP: (107-132)/(62-94) 113/67 (05/19 0756) SpO2:  [93 %-99 %] 98 % (05/19 0825) FiO2 (%):  [21 %] 21 % (05/19 0825) Weight:  [81.5 kg] 81.5 kg (05/19 0500) Last BM Date: 12/26/19  Intake/Output from previous day: 05/18 0701 - 05/19 0700 In: -  Out: 700 [Urine:700] Intake/Output this shift: No intake/output data recorded.  PE: Gen: Lethargic, NAD Heart: RRR, 2+ DP pulses Lungs: On TC. Bilateral rhonchi, no wheezing, rate and effort normal Abd: Soft, ND, NT, +BS. PEG tube in place with abd binder over. TF's running Ext: Calves soft without edema Neuro: Opens eyes to verbal command  Lab Results:  No results for input(s): WBC, HGB, HCT, PLT in the last 72 hours. BMET Recent Labs    12/26/19 0615  NA 145  K 4.0  CL 108  CO2 25  GLUCOSE 104*  BUN 20  CREATININE 0.59*  CALCIUM 9.7   PT/INR No results for input(s): LABPROT, INR in the last 72 hours. CMP     Component Value Date/Time   NA 145 12/26/2019 0615   K 4.0 12/26/2019 0615   CL 108 12/26/2019 0615   CO2 25 12/26/2019 0615   GLUCOSE 104 (H) 12/26/2019 0615   BUN 20 12/26/2019 0615   CREATININE 0.59 (L) 12/26/2019 0615   CALCIUM 9.7 12/26/2019 0615   PROT 7.1 11/29/2019 2210   ALBUMIN 4.1 11/29/2019 2210   AST 44 (H) 11/29/2019 2210   ALT 21 11/29/2019 2210   ALKPHOS 42 11/29/2019 2210   BILITOT 0.9 11/29/2019 2210   GFRNONAA >60 12/26/2019 0615   GFRAA >60 12/26/2019 0615   Lipase  No results found for: LIPASE     Studies/Results: CT HEAD WO CONTRAST  Result Date: 12/27/2019 CLINICAL DATA:  Head trauma with headache  EXAM: CT HEAD WITHOUT CONTRAST TECHNIQUE: Contiguous axial images were obtained from the base of the skull through the vertex without intravenous contrast. COMPARISON:  Eight days ago FINDINGS: Brain: Hematoma centered at the right putamen has decreased in density and swelling. Low-density subdural collection along the upper left frontal lobe measures 7 mm in thickness, decreased by a few mm. There is interhemispheric CSF density subdural collection right of the falx which has decreased in thickness by 2 mm, now 10 mm in maximum. Bitemporal encephalomalacia. No acute hemorrhage, hydrocephalus, or visible infarct. Vascular: Negative Skull: Unremarkable right-sided craniotomy Sinuses/Orbits: Gaze to the left. IMPRESSION: 1. Decreasing right basal ganglia hematoma and mass effect. 2. Decreasing subdural collections at the interhemispheric fissure and left frontal parietal convexity. Electronically Signed   By: Monte Fantasia M.D.   On: 12/27/2019 09:20    Anti-infectives: Anti-infectives (From admission, onward)   Start     Dose/Rate Route Frequency Ordered Stop   12/12/19 1945  vancomycin (VANCOREADY) IVPB 1250 mg/250 mL  Status:  Discontinued     1,250 mg 166.7 mL/hr over 90 Minutes Intravenous Every 8 hours 12/12/19 1930 12/14/19 1115   12/10/19 1200  ceFEPIme (MAXIPIME) 2 g in sodium chloride 0.9 % 100 mL IVPB  Status:  Discontinued     2 g 200 mL/hr  over 30 Minutes Intravenous Every 8 hours 12/10/19 0814 12/12/19 1414   11/30/19 1830  ceFAZolin (ANCEF) IVPB 2g/100 mL premix     2 g 200 mL/hr over 30 Minutes Intravenous Every 8 hours 11/30/19 1726 12/01/19 1521   11/30/19 1545  vancomycin (VANCOCIN) powder  Status:  Discontinued       As needed 11/30/19 1546 11/30/19 1649   11/30/19 1430  ceFAZolin (ANCEF) IVPB 2g/100 mL premix  Status:  Discontinued     2 g 200 mL/hr over 30 Minutes Intravenous On call to O.R. 11/30/19 1134 11/30/19 1654   11/29/19 2215  ceFAZolin (ANCEF) IVPB 2g/100 mL premix      2 g 200 mL/hr over 30 Minutes Intravenous  Once 11/29/19 2210 11/29/19 2317       Assessment/Plan PHBC SDH, IPH- NSGY c/s (Dr. Ronnald Ramp), on home AED regimen,EEGnegative, repeat head CT 5/19 improving with Interhemispheric hypodensity smaller and Left extra-axial fluid collection stable to smaller, BG hemorrhage better. Neuro exam has improved somewhat. On valproate, propranolol. NS signed off 5/19 Scattered abrasions- wound care VDRF- trach 5/7, cont TC. Change to #4 cuffless 5/19 L comminuted tib/fib fx-s/p IMN,TDWB, PT/OT CV- tachy, propranololincreased 5/15 Alcohol abuse- SW c/s for SBIRTonceable FEN - s/p PEG 5/7, TF, NPO ABL anemia -stable 5/8  VTE - SCDs,LMWH ID - None currently  Dispo - Change to #4 cuffless trach. Continue TBI team therapies. Medically stable for discharge to SNF once facility is arranged.   LOS: 28 days    Wellington Hampshire, [facility] 12/27/2019, 9:54 AM Please see Amion for pager number during day hours 7:00am-4:30pm

## 2019-12-28 LAB — SARS CORONAVIRUS 2 BY RT PCR (HOSPITAL ORDER, PERFORMED IN ~~LOC~~ HOSPITAL LAB): SARS Coronavirus 2: NEGATIVE

## 2019-12-28 LAB — GLUCOSE, CAPILLARY
Glucose-Capillary: 92 mg/dL (ref 70–99)
Glucose-Capillary: 96 mg/dL (ref 70–99)
Glucose-Capillary: 90 mg/dL (ref 70–99)
Glucose-Capillary: 97 mg/dL (ref 70–99)
Glucose-Capillary: 96 mg/dL (ref 70–99)

## 2019-12-28 NOTE — TOC Progression Note (Addendum)
Transition of Care [facility]) - Progression Note    Patient Details  Name: Clinton [name] MRN: [id number] Date of Birth: 03-25-1996  [facility] CM/SW Contact  [name], RN Phone Number: 12/28/2019, 3:31 PM  Clinical Narrative:  PASSR number pending; PASSR requesting updated FL2 and notes regarding substance abuse and mental health issues.  Information provided and faxed to PASSR as requested.  Faxed to [phone], per request.  Per [facility], they are prepared to take pt pending PASSR; hopeful for 5/21.     Addendum: 58 Spoke with sister, who states that she was unaware that pt would be discharging so soon.  She states she was told by "other nurses" that he would be going to inpatient rehab.  Per my conversation with sister on 5/17, she was told about [facility], and she understood that pt was not at a functional level to go to CIR, and would need a slower paced, lengthier rehab.  She actually was pleased that [facility] was close to her home.  Sister stated she toured facility today; I prepared her that pt could be discharged within next 1-2 days.    Will provide updates as available; will continue to update sister regarding discharge details.       Expected Discharge Plan: Skilled Nursing Facility Barriers to Discharge: [place] [facility])  Expected Discharge Plan and Services Expected Discharge Plan: [place]   Discharge Planning Services: CM Consult   Living arrangements for the past 2 months: Single Family Home                                       Social Determinants of Health (SDOH) Interventions    Readmission Risk Interventions No flowsheet data found.  [name], RN, BSN  Trauma/Neuro ICU Case Manager [phone]
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
## 2019-12-28 NOTE — Discharge Instructions (Signed)
Please remain touch down weight weight bearing to your left lower extremitiy.    Subdural Hematoma  A subdural hematoma is a collection of blood between the brain and its outer covering (dura). As the amount of blood increases, pressure builds on the brain. There are two types of subdural hematomas:  Acute. This type develops shortly after a hard, direct hit to the head and causes blood to collect very quickly. This is a medical emergency. If it is not diagnosed and treated quickly, it can lead to severe brain injury or death.  Chronic. This is when bleeding develops more slowly, over weeks or months. In some cases, this type does not cause symptoms. What are the causes? This condition is caused by bleeding (hemorrhage) from a broken (ruptured) blood vessel. In most cases, a blood vessel ruptures and bleeds because of a head injury, such as from a hard, direct hit. Head injuries can happen in car accidents, falls, assaults, or while playing sports. In rare cases, a hemorrhage can happen without a known cause (spontaneously), especially if you take blood thinners (anticoagulants). What increases the risk? This condition is more likely to develop in:  Older people.  Infants.  People who take blood thinners.  People who have head injuries.  People who abuse alcohol. What are the signs or symptoms? Symptoms of this condition can vary depending on the size of the hematoma. Symptoms can be mild, severe, or life-threatening. They include:  Headaches.  Nausea or vomiting.  Changes in vision, such as double vision or loss of vision.  Changes in speech or trouble understanding what people say.  Loss of balance or trouble walking.  Weakness, numbness, or tingling in the arms or legs, especially on one side of the body.  Seizures.  Change in personality.  Increased sleepiness.  Memory loss.  Loss of consciousness.  Coma. Symptoms of acute subdural hematoma can develop over  minutes or hours. Symptoms of chronic subdural hematoma may develop over weeks or months. How is this diagnosed? This condition is diagnosed based on the results of:  A physical exam.  Tests of strength, reflexes, coordination, senses, manner of walking (gait), and facial and eye movements (neurological exam).  Imaging tests, such as an MRI or a CT scan. How is this treated? Treatment for this condition depends on the type of hematoma and how severe it is. Treatment for acute hematoma may include:  Emergency surgery to drain blood or remove a blood clot.  Medicines that help the body get rid of excess fluids (diuretics). These may help to reduce pressure in the brain.  Assisted breathing (ventilation). Treatment for chronic hematoma may include:  Observation and bed rest at the hospital.  Surgery. If you take blood thinners, you may need to stop taking them for a short time. You may also be given anti-seizure (anticonvulsant) medicine. Sometimes, no treatment is needed for chronic subdural hematoma. Follow these instructions at home: Activity  Avoid situations where you could injure your head again, such as in competitive sports, downhill snow sports, and horseback riding. Do not do these activities until your health care provider approves. ? Wear protective gear, such as a helmet, when participating in activities such as biking or contact sports.  Avoid too much visual stimulation while recovering. This means limiting how much you read and limiting your screen time on a smart phone, tablet, computer, or TV.  Rest as told by your health care provider. Rest helps the brain heal.  Try to avoid  activities that cause physical or mental stress. Return to work or school as told by your health care provider.  Do not lift anything that is heavier than 5 lb (2.3 kg), or the limit you are told, until your health care provider says that it is safe.  Do not drive, ride a bike, or use heavy  machinery until your health care provider approves.  Always wear your seat belt when you are in a motor vehicle. Alcohol use  Do not drink alcohol if your health care provider tells you not to drink.  If you drink alcohol, limit how much you use to: ? 0-1 drink a day for women. ? 0-2 drinks a day for men. General instructions  Monitor your symptoms, and ask people around you to do the same. Recovery from brain injuries varies. Talk with your health care provider about what to expect.  Take over-the-counter and prescription medicines only as told by your health care provider. Do not take blood thinners or NSAIDs unless your health care provider approves. These include aspirin, ibuprofen, naproxen, and warfarin.  Keep your home environment safe to reduce the risk of falling.  Keep all follow-up visits as told by your health care provider. This is important. Where to find more information  [employer] of Neurological Disorders and Stroke: MasterBoxes.it  American Academy of Neurology (AAN): http://keith.biz/  Brain Injury Association of [place]: www.biausa.org Get help right away if you:  Are taking blood thinners and you fall or you experience minor trauma to the head. If you take any blood thinners, even a very small injury can cause a subdural hematoma.  Have a bleeding disorder and you fall or you experience minor trauma to the head.  Develop any of the following symptoms after a head injury: ? Clear fluid draining from your nose or ears. ? Nausea or vomiting. ? Changes in speech or trouble understanding what people say. ? Seizures. ? Drowsiness or a decrease in alertness. ? Double vision. ? Numbness or inability to move (paralysis) in any part of your body. ? Difficulty walking or poor coordination. ? Difficulty thinking. ? Confusion or forgetfulness. ? Personality changes. ? Irrational or aggressive behavior. These symptoms may represent a serious problem that is  an emergency. Do not wait to see if the symptoms will go away. Get medical help right away. Call your local emergency services (911 in the [place] Do not drive yourself to the hospital. Summary  A subdural hematoma is a collection of blood between the brain and its outer covering (dura).  Treatment for this condition depends on what type of subdural hematoma you have and how severe it is.  Symptoms can vary from mild to severe to life-threatening.  Monitor your symptoms, and ask others around you to do the same. This information is not intended to replace advice given to you by your health care provider. Make sure you discuss any questions you have with your health care provider. Document Revised: 06/27/2018 Document Reviewed: 06/27/2018 Elsevier Patient Education  2020 [employer].

## 2019-12-28 NOTE — Progress Notes (Signed)
[facility] Progress Note  14 Days Post-Op  Subjective: CC-  Awake and alert this morning. Following some simple commands with RUE. Out of restraints >48 hours without issues.  Objective: Vital signs in last 24 hours: Temp:  [98.1 F (36.7 C)-98.4 F (36.9 C)] 98.3 F (36.8 C) (05/20 0500) Pulse Rate:  [79-114] 114 (05/20 0823) Resp:  [18-37] 20 (05/20 0823) BP: (104-128)/(61-83) 107/81 (05/20 0823) SpO2:  [97 %-99 %] 98 % (05/20 0823) FiO2 (%):  [21 %] 21 % (05/20 0823) Weight:  [78.1 kg] 78.1 kg (05/20 0453) Last BM Date: 12/26/19  Intake/Output from previous day: 05/19 0701 - 05/20 0700 In: -  Out: 1900 [Urine:1900] Intake/Output this shift: No intake/output data recorded.  PE: Gen: Alert, NAD Heart: RRR, 2+ DP pulses Lungs: On TC. Bilateral rhonchi, no wheezing, rate and effort normal Abd: Soft, ND, NT, +BS. PEG tube in place with abd binder over. TF's running Ext: Calves soft and nontender bilaterally Neuro: Opens eyes to verbal command, squeezes with right hand on command  Lab Results:  No results for input(s): WBC, HGB, HCT, PLT in the last 72 hours. BMET Recent Labs    12/26/19 0615  NA 145  K 4.0  CL 108  CO2 25  GLUCOSE 104*  BUN 20  CREATININE 0.59*  CALCIUM 9.7   PT/INR No results for input(s): LABPROT, INR in the last 72 hours. CMP     Component Value Date/Time   NA 145 12/26/2019 0615   K 4.0 12/26/2019 0615   CL 108 12/26/2019 0615   CO2 25 12/26/2019 0615   GLUCOSE 104 (H) 12/26/2019 0615   BUN 20 12/26/2019 0615   CREATININE 0.59 (L) 12/26/2019 0615   CALCIUM 9.7 12/26/2019 0615   PROT 7.1 11/29/2019 2210   ALBUMIN 4.1 11/29/2019 2210   AST 44 (H) 11/29/2019 2210   ALT 21 11/29/2019 2210   ALKPHOS 42 11/29/2019 2210   BILITOT 0.9 11/29/2019 2210   GFRNONAA >60 12/26/2019 0615   GFRAA >60 12/26/2019 0615   Lipase  No results found for: LIPASE     Studies/Results: CT HEAD WO CONTRAST  Result Date:  12/27/2019 CLINICAL DATA:  Head trauma with headache EXAM: CT HEAD WITHOUT CONTRAST TECHNIQUE: Contiguous axial images were obtained from the base of the skull through the vertex without intravenous contrast. COMPARISON:  Eight days ago FINDINGS: Brain: Hematoma centered at the right putamen has decreased in density and swelling. Low-density subdural collection along the upper left frontal lobe measures 7 mm in thickness, decreased by a few mm. There is interhemispheric CSF density subdural collection right of the falx which has decreased in thickness by 2 mm, now 10 mm in maximum. Bitemporal encephalomalacia. No acute hemorrhage, hydrocephalus, or visible infarct. Vascular: Negative Skull: Unremarkable right-sided craniotomy Sinuses/Orbits: Gaze to the left. IMPRESSION: 1. Decreasing right basal ganglia hematoma and mass effect. 2. Decreasing subdural collections at the interhemispheric fissure and left frontal parietal convexity. Electronically Signed   By: Monte Fantasia M.D.   On: 12/27/2019 09:20    Anti-infectives: Anti-infectives (From admission, onward)   Start     Dose/Rate Route Frequency Ordered Stop   12/12/19 1945  vancomycin (VANCOREADY) IVPB 1250 mg/250 mL  Status:  Discontinued     1,250 mg 166.7 mL/hr over 90 Minutes Intravenous Every 8 hours 12/12/19 1930 12/14/19 1115   12/10/19 1200  ceFEPIme (MAXIPIME) 2 g in sodium chloride 0.9 % 100 mL IVPB  Status:  Discontinued  2 g 200 mL/hr over 30 Minutes Intravenous Every 8 hours 12/10/19 0814 12/12/19 1414   11/30/19 1830  ceFAZolin (ANCEF) IVPB 2g/100 mL premix     2 g 200 mL/hr over 30 Minutes Intravenous Every 8 hours 11/30/19 1726 12/01/19 1521   11/30/19 1545  vancomycin (VANCOCIN) powder  Status:  Discontinued       As needed 11/30/19 1546 11/30/19 1649   11/30/19 1430  ceFAZolin (ANCEF) IVPB 2g/100 mL premix  Status:  Discontinued     2 g 200 mL/hr over 30 Minutes Intravenous On call to O.R. 11/30/19 1134 11/30/19 1654    11/29/19 2215  ceFAZolin (ANCEF) IVPB 2g/100 mL premix     2 g 200 mL/hr over 30 Minutes Intravenous  Once 11/29/19 2210 11/29/19 2317       Assessment/Plan PHBC SDH, IPH- NSGY c/s (Dr. Ronnald Ramp), on home AED regimen,EEGnegative, repeat head CT 5/19 improving with Interhemispheric hypodensity smaller and Left extra-axial fluid collection stable to smaller, BG hemorrhage better. Neuro exam has improved somewhat. On valproate, propranolol. NS signed off 5/19 Scattered abrasions- wound care VDRF- trach 5/7, cont TC. Changed to #4 cuffless 5/19 L comminuted tib/fib fx-s/p IMN,TDWB, PT/OT CV- tachy, propranololincreased5/15 Alcohol abuse- SW c/s for SBIRTonceable FEN - s/p PEG 5/7, TF, NPO ABL anemia -stable 5/8 VTE - SCDs,LMWH ID - None currently Dispo - Continue TBI team therapies. Medically stable for discharge to SNF. Will send covid test this AM.   LOS: 29 days    Wellington Hampshire, [facility] Surgery 12/28/2019, 8:29 AM Please see Amion for pager number during day hours 7:00am-4:30pm

## 2019-12-28 NOTE — Progress Notes (Signed)
Updated progress note:  Status of reported Substance abuse/Alcohol Abuse - Not current requiring any medications or treatment. Not currently on CIWA Hx of Bipolar disorder - No current medications  Alferd Apa, [facility] Surgery 12/28/2019, 2:45 PM
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
## 2019-12-28 NOTE — Plan of Care (Signed)
  Problem: Education: Goal: Knowledge of the prescribed therapeutic regimen Outcome: Progressing Goal: Knowledge of disease or condition will improve Outcome: Progressing   Problem: Clinical Measurements: Goal: Neurologic status will improve Outcome: Progressing   Problem: Tissue Perfusion: Goal: Ability to maintain intracranial pressure will improve Outcome: Progressing   Problem: Respiratory: Goal: Will regain and/or maintain adequate ventilation Outcome: Progressing   Problem: Skin Integrity: Goal: Risk for impaired skin integrity will decrease Outcome: Progressing Goal: Demonstration of wound healing without infection will improve Outcome: Progressing   Problem: Psychosocial: Goal: Ability to verbalize positive feelings about self will improve Outcome: Progressing Goal: Ability to participate in self-care as condition permits will improve Outcome: Progressing Goal: Ability to identify appropriate support needs will improve Outcome: Progressing   Problem: Health Behavior/Discharge Planning: Goal: Ability to manage health-related needs will improve Outcome: Progressing   Problem: Nutritional: Goal: Risk of aspiration will decrease Outcome: Progressing Goal: Dietary intake will improve Outcome: Progressing   Problem: Communication: Goal: Ability to communicate needs accurately will improve Outcome: Progressing
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
## 2019-12-29 LAB — GLUCOSE, CAPILLARY
Glucose-Capillary: 103 mg/dL — ABNORMAL HIGH (ref 70–99)
Glucose-Capillary: 119 mg/dL — ABNORMAL HIGH (ref 70–99)
Glucose-Capillary: 96 mg/dL (ref 70–99)
Glucose-Capillary: 97 mg/dL (ref 70–99)

## 2019-12-29 NOTE — Progress Notes (Signed)
15 Days Post-Op  Subjective: CC: More awake this morning. Following some simple commands for the RUE. No acute changes overnight.   Objective: Vital signs in last 24 hours: Temp:  [98.3 F (36.8 C)-99.4 F (37.4 C)] 99.4 F (37.4 C) (05/21 0800) Pulse Rate:  [77-114] 109 (05/21 0812) Resp:  [18-28] 21 (05/21 0812) BP: (107-135)/(73-96) 125/77 (05/21 0812) SpO2:  [98 %-100 %] 98 % (05/21 0812) FiO2 (%):  [21 %] 21 % (05/21 0812) Weight:  [80.7 kg] 80.7 kg (05/21 0432) Last BM Date: 12/28/19  Intake/Output from previous day: 05/20 0701 - 05/21 0700 In: -  Out: 2650 [Urine:1100; Emesis/NG output:800; Stool:750] Intake/Output this shift: No intake/output data recorded.  PE: [id number], NAD Heart:RRR, 2+ DP pulses Lungs: On TC.Bilateral rhonchi, no wheezing, rate and effort normal Abd: Soft, ND,NT, +BS. PEG tube in place with abd binder over. TF's running Ext: Calves softand nontender bilaterally Neuro: Opens eyes to verbal command, squeezes with right hand on command  Lab Results:  No results for input(s): WBC, HGB, HCT, PLT in the last 72 hours. BMET No results for input(s): NA, K, CL, CO2, GLUCOSE, BUN, CREATININE, CALCIUM in the last 72 hours. PT/INR No results for input(s): LABPROT, INR in the last 72 hours. CMP     Component Value Date/Time   NA 145 12/26/2019 0615   K 4.0 12/26/2019 0615   CL 108 12/26/2019 0615   CO2 25 12/26/2019 0615   GLUCOSE 104 (H) 12/26/2019 0615   BUN 20 12/26/2019 0615   CREATININE 0.59 (L) 12/26/2019 0615   CALCIUM 9.7 12/26/2019 0615   PROT 7.1 11/29/2019 2210   ALBUMIN 4.1 11/29/2019 2210   AST 44 (H) 11/29/2019 2210   ALT 21 11/29/2019 2210   ALKPHOS 42 11/29/2019 2210   BILITOT 0.9 11/29/2019 2210   GFRNONAA >60 12/26/2019 0615   GFRAA >60 12/26/2019 0615   Lipase  No results found for: LIPASE     Studies/Results: No results found.  Anti-infectives: Anti-infectives (From admission, onward)   Start      Dose/Rate Route Frequency Ordered Stop   12/12/19 1945  vancomycin (VANCOREADY) IVPB 1250 mg/250 mL  Status:  Discontinued     1,250 mg 166.7 mL/hr over 90 Minutes Intravenous Every 8 hours 12/12/19 1930 12/14/19 1115   12/10/19 1200  ceFEPIme (MAXIPIME) 2 g in sodium chloride 0.9 % 100 mL IVPB  Status:  Discontinued     2 g 200 mL/hr over 30 Minutes Intravenous Every 8 hours 12/10/19 0814 12/12/19 1414   11/30/19 1830  ceFAZolin (ANCEF) IVPB 2g/100 mL premix     2 g 200 mL/hr over 30 Minutes Intravenous Every 8 hours 11/30/19 1726 12/01/19 1521   11/30/19 1545  vancomycin (VANCOCIN) powder  Status:  Discontinued       As needed 11/30/19 1546 11/30/19 1649   11/30/19 1430  ceFAZolin (ANCEF) IVPB 2g/100 mL premix  Status:  Discontinued     2 g 200 mL/hr over 30 Minutes Intravenous On call to O.R. 11/30/19 1134 11/30/19 1654   11/29/19 2215  ceFAZolin (ANCEF) IVPB 2g/100 mL premix     2 g 200 mL/hr over 30 Minutes Intravenous  Once 11/29/19 2210 11/29/19 2317       Assessment/Plan PHBC SDH, IPH- NSGY c/s (Dr. Ronnald Ramp), on home AED regimen,EEGnegative, repeat head CT5/19 improving withInterhemispheric hypodensity smaller and Leftextra-axial fluid collection stable to smaller,BG hemorrhage better. Neuro exam has improved somewhat. On valproate, propranolol. NS signed off 5/19 Scattered abrasions-  wound care VDRF- trach 5/7, cont TC. Changed to #4 cuffless 5/19 L comminuted tib/fib fx-s/p IMN,TDWB, PT/OT CV- tachy, propranololincreased5/15 Status of reported Substance abuse/Alcohol Abuse - Not current requiring any medications or treatment. Not currently on CIWA. SW c/s for SBIRTonceable Hx of Bipolar disorder - No current medications or symptoms  FEN - s/p PEG 5/7, TF, NPO ABL anemia -stable 5/8 VTE - SCDs,LMWH ID - None currently Dispo -Continue TBI team therapies. Medically stable for discharge toSNF   LOS: 30 days    Jillyn Ledger , [facility]  Surgery 12/29/2019, 8:17 AM Please see Amion for pager number during day hours 7:00am-4:30pm

## 2019-12-29 NOTE — TOC Progression Note (Signed)
Transition of Care [facility]) - Progression Note    Patient Details  Name: Clinton [name] MRN: [id number] Date of Birth: [birth year]  Transition of Care [facility]) CM/SW Contact  [name], RN Phone Number: 12/29/2019, 3:45 PM  Clinical Narrative:   PASSR number received for patient.  Notified [name]; bed available for patient on Monday, May 17th.  Notified provider of plan for discharge to SNF on Monday.  Notified pt's sister, [name]; she is agreeable to discharge to [facility] on Monday, and plans to go to facility to sign paperwork.  Follow up on Monday to arrange transport.      Expected Discharge Plan: Skilled Nursing Facility Barriers to Discharge: Barriers Resolved  Expected Discharge Plan and Services Expected Discharge Plan: [place]   Discharge Planning Services: CM Consult   Living arrangements for the past 2 months: Single Family Home                                       Social Determinants of Health (SDOH) Interventions    Readmission Risk Interventions No flowsheet data found.  [name], RN, BSN  Trauma/Neuro ICU Case Manager [phone]

## 2019-12-30 LAB — GLUCOSE, CAPILLARY
Glucose-Capillary: 107 mg/dL — ABNORMAL HIGH (ref 70–99)
Glucose-Capillary: 109 mg/dL — ABNORMAL HIGH (ref 70–99)
Glucose-Capillary: 113 mg/dL — ABNORMAL HIGH (ref 70–99)
Glucose-Capillary: 109 mg/dL — ABNORMAL HIGH (ref 70–99)
Glucose-Capillary: 108 mg/dL — ABNORMAL HIGH (ref 70–99)

## 2019-12-30 NOTE — Progress Notes (Signed)
16 Days Post-Op   Subjective/Chief Complaint: Awake and alert. Responds a little with right arm   Objective: Vital signs in last 24 hours: Temp:  [97.5 F (36.4 C)-98.8 F (37.1 C)] 98.6 F (37 C) (05/22 0400) Pulse Rate:  [83-111] 105 (05/22 0400) Resp:  [17-24] 21 (05/22 0400) BP: (92-131)/(53-75) 100/67 (05/22 0421) SpO2:  [97 %-99 %] 98 % (05/22 0400) FiO2 (%):  [21 %] 21 % (05/22 0259) Last BM Date: 12/29/19  Intake/Output from previous day: 05/21 0701 - 05/22 0700 In: -  Out: 1850 [Urine:1850] Intake/Output this shift: Total I/O In: -  Out: 800 [Urine:800]  General appearance: alert and slowed mentation Resp: clear to auscultation bilaterally and trached Cardio: regular rate and rhythm GI: soft, nontender  Lab Results:  No results for input(s): WBC, HGB, HCT, PLT in the last 72 hours. BMET No results for input(s): NA, K, CL, CO2, GLUCOSE, BUN, CREATININE, CALCIUM in the last 72 hours. PT/INR No results for input(s): LABPROT, INR in the last 72 hours. ABG No results for input(s): PHART, HCO3 in the last 72 hours.  Invalid input(s): PCO2, PO2  Studies/Results: No results found.  Anti-infectives: Anti-infectives (From admission, onward)   Start     Dose/Rate Route Frequency Ordered Stop   12/12/19 1945  vancomycin (VANCOREADY) IVPB 1250 mg/250 mL  Status:  Discontinued     1,250 mg 166.7 mL/hr over 90 Minutes Intravenous Every 8 hours 12/12/19 1930 12/14/19 1115   12/10/19 1200  ceFEPIme (MAXIPIME) 2 g in sodium chloride 0.9 % 100 mL IVPB  Status:  Discontinued     2 g 200 mL/hr over 30 Minutes Intravenous Every 8 hours 12/10/19 0814 12/12/19 1414   11/30/19 1830  ceFAZolin (ANCEF) IVPB 2g/100 mL premix     2 g 200 mL/hr over 30 Minutes Intravenous Every 8 hours 11/30/19 1726 12/01/19 1521   11/30/19 1545  vancomycin (VANCOCIN) powder  Status:  Discontinued       As needed 11/30/19 1546 11/30/19 1649   11/30/19 1430  ceFAZolin (ANCEF) IVPB 2g/100 mL  premix  Status:  Discontinued     2 g 200 mL/hr over 30 Minutes Intravenous On call to O.R. 11/30/19 1134 11/30/19 1654   11/29/19 2215  ceFAZolin (ANCEF) IVPB 2g/100 mL premix     2 g 200 mL/hr over 30 Minutes Intravenous  Once 11/29/19 2210 11/29/19 2317      Assessment/Plan: s/p Procedure(s): OPEN TRACHEOSTOMY (N/A) ESOPHAGOGASTRODUODENOSCOPY (EGD) (N/A) PERCUTANEOUS ENDOSCOPIC GASTROSTOMY (PEG) PLACEMENT (N/A) Continue tube feeds for nutrition support  PHBC SDH, IPH- NSGY c/s (Dr. Ronnald Ramp), on home AED regimen,EEGnegative, repeat head CT5/19 improving withInterhemispheric hypodensity smaller and Leftextra-axial fluid collection stable to smaller,BG hemorrhage better. Neuro exam has improved somewhat. On valproate, propranolol. NS signed off 5/19 Scattered abrasions- wound care VDRF- trach 5/7, cont TC. Changedto #4 cuffless 5/19 L comminuted tib/fib fx-s/p IMN,TDWB, PT/OT CV- tachy, propranololincreased5/15 Status of reported Substance abuse/Alcohol Abuse - Not current requiring any medications or treatment. Not currently on CIWA. SW c/s for SBIRTonceable Hx of Bipolar disorder - No current medications or symptoms  FEN - s/p PEG 5/7, TF, NPO ABL anemia -stable 5/8 VTE - SCDs,LMWH ID - None currently Dispo -Continue TBI team therapies. Medically stable for discharge toSNF  LOS: 31 days    Clinton [name] 12/30/2019

## 2019-12-31 LAB — GLUCOSE, CAPILLARY
Glucose-Capillary: 120 mg/dL — ABNORMAL HIGH (ref 70–99)
Glucose-Capillary: 106 mg/dL — ABNORMAL HIGH (ref 70–99)
Glucose-Capillary: 129 mg/dL — ABNORMAL HIGH (ref 70–99)
Glucose-Capillary: 116 mg/dL — ABNORMAL HIGH (ref 70–99)

## 2019-12-31 LAB — SARS CORONAVIRUS 2 (TAT 6-24 HRS): SARS Coronavirus 2: NEGATIVE

## 2019-12-31 NOTE — Progress Notes (Signed)
17 Days Post-Op   Subjective/Chief Complaint: Resting comfortably   Objective: Vital signs in last 24 hours: Temp:  [98.4 F (36.9 C)-99.2 F (37.3 C)] 98.6 F (37 C) (05/23 0719) Pulse Rate:  [93-110] 110 (05/23 0719) Resp:  [16-28] 16 (05/23 0719) BP: (84-120)/(70-94) 84/70 (05/23 0719) SpO2:  [96 %-98 %] 98 % (05/23 0719) FiO2 (%):  [21 %-28 %] 21 % (05/23 0432) Last BM Date: 12/29/19  Intake/Output from previous day: 05/22 0701 - 05/23 0700 In: -  Out: 2475 [Urine:2475] Intake/Output this shift: No intake/output data recorded.  General appearance: arousable, resting comfortably Resp: clear to auscultation bilaterally and trached Cardio: regular rate and rhythm GI: soft, nontender  Lab Results:  No results for input(s): WBC, HGB, HCT, PLT in the last 72 hours. BMET No results for input(s): NA, K, CL, CO2, GLUCOSE, BUN, CREATININE, CALCIUM in the last 72 hours. PT/INR No results for input(s): LABPROT, INR in the last 72 hours. ABG No results for input(s): PHART, HCO3 in the last 72 hours.  Invalid input(s): PCO2, PO2  Studies/Results: No results found.  Anti-infectives: Anti-infectives (From admission, onward)   Start     Dose/Rate Route Frequency Ordered Stop   12/12/19 1945  vancomycin (VANCOREADY) IVPB 1250 mg/250 mL  Status:  Discontinued     1,250 mg 166.7 mL/hr over 90 Minutes Intravenous Every 8 hours 12/12/19 1930 12/14/19 1115   12/10/19 1200  ceFEPIme (MAXIPIME) 2 g in sodium chloride 0.9 % 100 mL IVPB  Status:  Discontinued     2 g 200 mL/hr over 30 Minutes Intravenous Every 8 hours 12/10/19 0814 12/12/19 1414   11/30/19 1830  ceFAZolin (ANCEF) IVPB 2g/100 mL premix     2 g 200 mL/hr over 30 Minutes Intravenous Every 8 hours 11/30/19 1726 12/01/19 1521   11/30/19 1545  vancomycin (VANCOCIN) powder  Status:  Discontinued       As needed 11/30/19 1546 11/30/19 1649   11/30/19 1430  ceFAZolin (ANCEF) IVPB 2g/100 mL premix  Status:  Discontinued      2 g 200 mL/hr over 30 Minutes Intravenous On call to O.R. 11/30/19 1134 11/30/19 1654   11/29/19 2215  ceFAZolin (ANCEF) IVPB 2g/100 mL premix     2 g 200 mL/hr over 30 Minutes Intravenous  Once 11/29/19 2210 11/29/19 2317      Assessment/Plan: s/p Procedure(s): OPEN TRACHEOSTOMY (N/A) ESOPHAGOGASTRODUODENOSCOPY (EGD) (N/A) PERCUTANEOUS ENDOSCOPIC GASTROSTOMY (PEG) PLACEMENT (N/A) await snf  Continue tube feeds for nutrition support  PHBC SDH, IPH- NSGY c/s (Dr. Ronnald Ramp), on home AED regimen,EEGnegative, repeat head CT5/19 improving withInterhemispheric hypodensity smaller and Leftextra-axial fluid collection stable to smaller,BG hemorrhage better. Neuro exam has improved somewhat. On valproate, propranolol. NS signed off 5/19 Scattered abrasions- wound care VDRF- trach 5/7, cont TC. Changedto #4 cuffless 5/19 L comminuted tib/fib fx-s/p IMN,TDWB, PT/OT CV- tachy, propranololincreased5/15 Status of reported Substance abuse/Alcohol Abuse- Not current requiring any medications or treatment. Not currently on CIWA.SW c/s for SBIRTonceable Hx of Bipolar disorder- No current medications or symptoms FEN - s/p PEG 5/7, TF, NPO ABL anemia -stable 5/8 VTE - SCDs,LMWH ID - None currently Dispo -Continue TBI team therapies. Medically stable for discharge toSNF  LOS: 32 days    Autumn Messing III 12/31/2019

## 2019-12-31 NOTE — Progress Notes (Signed)
Pt trach was almost out. Pushed trach back in and verified placement with CO2 detector color change and pt sats 100% and not in any distress. 27 RR, 97%, 119/79, and HR 111. Cleaned pts inner cannula and around trach with clean gauze and set up a new aerosol bottle. RT to cont to monitor.

## 2019-12-31 NOTE — Progress Notes (Addendum)
Upon my rounds I noticed trach slightly pulled out patient in no acute distress, O2 saturation at 98%,  called RT to see patient, although he has a stong cough he is needing suction however waiting for RT to assess pt first.

## 2019-12-31 NOTE — Progress Notes (Addendum)
Noticed bleeding at trach site, RRT already change dressing, RN suctioned pt once, will continue to monitor trach site and perform any additional care if needed.

## 2020-01-01 ENCOUNTER — Encounter (HOSPITAL_COMMUNITY): Payer: Self-pay

## 2020-01-01 LAB — GLUCOSE, CAPILLARY
Glucose-Capillary: 122 mg/dL — ABNORMAL HIGH (ref 70–99)
Glucose-Capillary: 96 mg/dL (ref 70–99)

## 2020-01-01 MED ORDER — VALPROIC ACID 250 MG/5ML PO SOLN: 750.0000 mg | Freq: Every evening | ORAL | Status: DC

## 2020-01-01 MED ORDER — LEVETIRACETAM 100 MG/ML PO SOLN: 1000.0000 mg | Freq: Two times a day (BID) | ORAL | 12 refills | Status: AC

## 2020-01-01 MED ORDER — VALPROIC ACID 250 MG/5ML PO SOLN: 500.0000 mg | Freq: Every day | ORAL | Status: DC

## 2020-01-01 MED ORDER — JEVITY 1.5 CAL/FIBER PO LIQD: 1000.0000 mL | ORAL | Status: DC

## 2020-01-01 MED ORDER — PRO-STAT SUGAR FREE PO LIQD: 30.0000 mL | Freq: Two times a day (BID) | ORAL | 0 refills | Status: AC

## 2020-01-01 MED ORDER — POLYETHYLENE GLYCOL 3350 17 G PO PACK: 17.0000 g | PACK | Freq: Every day | ORAL | 0 refills | Status: AC

## 2020-01-01 MED ORDER — ACETAMINOPHEN 160 MG/5ML PO SOLN: 650.0000 mg | Freq: Four times a day (QID) | ORAL | 0 refills | Status: DC | PRN

## 2020-01-01 NOTE — Progress Notes (Signed)
Pt discharged to [facility]. Report called to [facility]. Pt's PIV removed, condom cath and rectal tube left intact, and PEG tube clamped for transfer. All belongings were packed and AVS printed and sent in folder with pt.

## 2020-01-01 NOTE — TOC Transition Note (Signed)
Transition of Care [facility]) - CM/SW Discharge Note   Patient Details  Name: Clinton [name] MRN: [id number] Date of Birth: 03-22-1996  Transition of Care [facility]) CM/SW Contact:  [name], RN Phone Number: 01/01/2020, 12:14 PM   Clinical Narrative:  Pt medically stable for discharge today and has been accepted for admission to [facility].  Faxed DC Summary and transfer packet to [facility] with facility admissions department.  Bedside nurse to call report to [phone]; pt going to room 411-B.  Facility requests that two #4 inner cannulas be sent with pt; these have been ordered and packed with pt belongings. PTAR notified for 1:00pm transport with oxygen.        Final next level of care: Skilled Nursing Facility Barriers to Discharge: Barriers Resolved   Patient Goals and CMS Choice   CMS Medicare.gov Compare Post Acute Care list provided to:: Patient Represenative (must comment)(sister [name])    Discharge Placement PASRR number recieved: 12/29/19            Patient chooses bed at: [facility] Patient to be transferred to facility by: [place] Name of family member notified: Linward Natal Patient and family notified of of transfer: 01/01/20  Discharge Plan and Services   Discharge Planning Services: CM Consult Post Acute Care Choice: [facility]                               Social Determinants of Health ([place]) Interventions     Readmission Risk Interventions Readmission Risk Prevention Plan 01/01/2020  Transportation Screening Complete  PCP or Specialist Appt within 5-7 Days Not Complete  Not Complete comments Pt discharging to SNF  Home Care Screening Not Complete  Home Care Screening Not Completed Comments Pt discharging to SNF  Medication Review (RN CM) Complete   [name], RN, BSN  Trauma/Neuro ICU Case Manager [phone]
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
## 2020-01-06 ENCOUNTER — Emergency Department (HOSPITAL_COMMUNITY): Payer: Medicaid Other

## 2020-01-06 ENCOUNTER — Encounter (HOSPITAL_COMMUNITY): Payer: Self-pay | Admitting: *Deleted

## 2020-01-06 ENCOUNTER — Emergency Department (HOSPITAL_COMMUNITY)
Admission: EM | Admit: 2020-01-06 | Discharge: 2020-01-07 | Disposition: A | Discharge status: Skilled Nursing Facility | Payer: Medicaid Other | Attending: Emergency Medicine | Admitting: Emergency Medicine

## 2020-01-06 ENCOUNTER — Other Ambulatory Visit: Payer: Self-pay

## 2020-01-06 DIAGNOSIS — F17210 Nicotine dependence, cigarettes, uncomplicated: Secondary | ICD-10-CM | POA: Diagnosis not present

## 2020-01-06 DIAGNOSIS — S066X9D Traumatic subarachnoid hemorrhage with loss of consciousness of unspecified duration, subsequent encounter: Secondary | ICD-10-CM | POA: Insufficient documentation

## 2020-01-06 DIAGNOSIS — Z79899 Other long term (current) drug therapy: Secondary | ICD-10-CM | POA: Insufficient documentation

## 2020-01-06 DIAGNOSIS — J9509 Other tracheostomy complication: Secondary | ICD-10-CM | POA: Insufficient documentation

## 2020-01-06 DIAGNOSIS — R000 Tachycardia, unspecified: Secondary | ICD-10-CM | POA: Diagnosis not present

## 2020-01-06 DIAGNOSIS — T85598A Other mechanical complication of other gastrointestinal prosthetic devices, implants and grafts, initial encounter: Secondary | ICD-10-CM | POA: Diagnosis not present

## 2020-01-06 DIAGNOSIS — R569 Unspecified convulsions: Secondary | ICD-10-CM | POA: Diagnosis not present

## 2020-01-06 DIAGNOSIS — K9423 Gastrostomy malfunction: Secondary | ICD-10-CM

## 2020-01-06 DIAGNOSIS — Y69 Unspecified misadventure during surgical and medical care: Secondary | ICD-10-CM | POA: Insufficient documentation

## 2020-01-06 LAB — BLOOD GAS, VENOUS
Acid-Base Excess: 6.2 mmol/L — ABNORMAL HIGH (ref 0.0–2.0)
Bicarbonate: 28.7 mmol/L — ABNORMAL HIGH (ref 20.0–28.0)
FIO2: 21
O2 Saturation: 82.3 %
Patient temperature: 37.6
pCO2, Ven: 45 mmHg (ref 44.0–60.0)
pH, Ven: 7.443 — ABNORMAL HIGH (ref 7.250–7.430)
pO2, Ven: 51.4 mmHg — ABNORMAL HIGH (ref 32.0–45.0)

## 2020-01-06 LAB — CBC WITH DIFFERENTIAL/PLATELET
Abs Immature Granulocytes: 0.01 10*3/uL (ref 0.00–0.07)
Basophils Absolute: 0 10*3/uL (ref 0.0–0.1)
Basophils Relative: 0 %
Eosinophils Absolute: 0.3 10*3/uL (ref 0.0–0.5)
Eosinophils Relative: 3 %
HCT: 37.9 % — ABNORMAL LOW (ref 39.0–52.0)
Hemoglobin: 11.9 g/dL — ABNORMAL LOW (ref 13.0–17.0)
Immature Granulocytes: 0 %
Lymphocytes Relative: 19 %
Lymphs Abs: 1.8 10*3/uL (ref 0.7–4.0)
MCH: 28.9 pg (ref 26.0–34.0)
MCHC: 31.4 g/dL (ref 30.0–36.0)
MCV: 92 fL (ref 80.0–100.0)
Monocytes Absolute: 0.7 10*3/uL (ref 0.1–1.0)
Monocytes Relative: 8 %
Neutro Abs: 6.5 10*3/uL (ref 1.7–7.7)
Neutrophils Relative %: 70 %
Platelets: 351 10*3/uL (ref 150–400)
RBC: 4.12 MIL/uL — ABNORMAL LOW (ref 4.22–5.81)
RDW: 14.2 % (ref 11.5–15.5)
WBC: 9.3 10*3/uL (ref 4.0–10.5)
nRBC: 0 % (ref 0.0–0.2)

## 2020-01-06 LAB — D-DIMER, QUANTITATIVE: D-Dimer, Quant: 1.54 ug/mL-FEU — ABNORMAL HIGH (ref 0.00–0.50)

## 2020-01-06 LAB — BASIC METABOLIC PANEL
Anion gap: 10 (ref 5–15)
BUN: 19 mg/dL (ref 6–20)
CO2: 28 mmol/L (ref 22–32)
Calcium: 10 mg/dL (ref 8.9–10.3)
Chloride: 105 mmol/L (ref 98–111)
Creatinine, Ser: 0.67 mg/dL (ref 0.61–1.24)
GFR calc Af Amer: >60 mL/min (ref >60)
GFR calc non Af Amer: >60 mL/min (ref >60)
Glucose, Bld: 102 mg/dL — ABNORMAL HIGH (ref 70–99)
Potassium: 3.9 mmol/L (ref 3.5–5.1)
Sodium: 143 mmol/L (ref 135–145)

## 2020-01-06 LAB — VALPROIC ACID LEVEL: Valproic Acid Lvl: <10 ug/mL — ABNORMAL LOW (ref 50.0–100.0)

## 2020-01-06 MED ORDER — DIATRIZOATE MEGLUMINE & SODIUM 66-10 % PO SOLN
ORAL | Status: AC
  Filled 2020-01-06: qty 30

## 2020-01-06 MED ORDER — IOHEXOL 350 MG/ML SOLN
100.0000 mL | Freq: Once | INTRAVENOUS | Status: AC | PRN
  Administered 2020-01-06: 100 mL via INTRAVENOUS

## 2020-01-06 MED ORDER — SODIUM CHLORIDE 0.9 % IV BOLUS
1000.0000 mL | Freq: Once | INTRAVENOUS | Status: AC
  Administered 2020-01-06: 1000 mL via INTRAVENOUS

## 2020-01-06 MED ORDER — PROPRANOLOL HCL 10 MG PO TABS
20.0000 mg | ORAL_TABLET | Freq: Once | ORAL | Status: AC
  Administered 2020-01-06: 20 mg via NASOGASTRIC
  Filled 2020-01-06: qty 2

## 2020-01-06 NOTE — ED Notes (Signed)
Attempted to give medication via peg tube, no stomach contents out, will flush without difficulty but will not draw, PA notified, await x ray confirmation.  Range of motion complete on R arm with mit, no significant redness noted, circulation intact.

## 2020-01-06 NOTE — Discharge Instructions (Addendum)
Clinton [name]'s breathing appears to be stable without the tracheostomy in place.  His primary MD Dr. Mal Amabile needs to be advised of this loss of the trach tube however.  Also, it is important for him to have a vital signs including this respiratory rate and his oxygen saturation level checked every hour over the next 2 days to ensure these levels continue to be stable.  Contact his primary MD or have him return here for any shortness of breath or persistent oxygen saturation levels less than 92%.
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
## 2020-01-06 NOTE — ED Notes (Signed)
Pt in bed, pt has booties on and appears to have a contracture on the L, pt presents to the ed with a mit in place, pt pulling on O2 sat cable and cardiac monitor leads, pt has closed trach hole with some dried and fresh blood, cleaned trach hole, pt placed on cardiac monitor pt appears to be in sinus tach.

## 2020-01-06 NOTE — ED Triage Notes (Addendum)
Pt brought in by [facility] for c/o pulling out his trach, bloody discharge noted from trach site

## 2020-01-06 NOTE — ED Notes (Signed)
Pt in bed, x ray complete, pt had a bm, changed pt's brief and cleaned pt.  Pt continues to pull on tubes, brief and leads.

## 2020-03-13 ENCOUNTER — Encounter (HOSPITAL_COMMUNITY): Payer: Self-pay | Admitting: Emergency Medicine

## 2020-03-13 ENCOUNTER — Emergency Department (HOSPITAL_COMMUNITY): Payer: Medicaid Other

## 2020-03-13 ENCOUNTER — Other Ambulatory Visit (HOSPITAL_COMMUNITY): Payer: Medicaid Other

## 2020-03-13 ENCOUNTER — Observation Stay (HOSPITAL_COMMUNITY): Payer: Medicaid Other

## 2020-03-13 ENCOUNTER — Observation Stay (HOSPITAL_COMMUNITY)
Admission: EM | Admit: 2020-03-13 | Discharge: 2020-03-14 | Disposition: A | Discharge status: Home / Self Care | Payer: Medicaid Other | Attending: Family Medicine | Admitting: Family Medicine

## 2020-03-13 ENCOUNTER — Other Ambulatory Visit: Payer: Self-pay

## 2020-03-13 DIAGNOSIS — T85528A Displacement of other gastrointestinal prosthetic devices, implants and grafts, initial encounter: Secondary | ICD-10-CM | POA: Diagnosis not present

## 2020-03-13 DIAGNOSIS — S065XAA Traumatic subdural hemorrhage with loss of consciousness status unknown, initial encounter: Secondary | ICD-10-CM | POA: Diagnosis present

## 2020-03-13 DIAGNOSIS — I4891 Unspecified atrial fibrillation: Secondary | ICD-10-CM | POA: Diagnosis not present

## 2020-03-13 DIAGNOSIS — Z87820 Personal history of traumatic brain injury: Secondary | ICD-10-CM | POA: Insufficient documentation

## 2020-03-13 DIAGNOSIS — Z20822 Contact with and (suspected) exposure to covid-19: Secondary | ICD-10-CM | POA: Insufficient documentation

## 2020-03-13 DIAGNOSIS — I169 Hypertensive crisis, unspecified: Secondary | ICD-10-CM | POA: Insufficient documentation

## 2020-03-13 DIAGNOSIS — R9389 Abnormal findings on diagnostic imaging of other specified body structures: Secondary | ICD-10-CM | POA: Diagnosis not present

## 2020-03-13 DIAGNOSIS — R079 Chest pain, unspecified: Secondary | ICD-10-CM | POA: Diagnosis not present

## 2020-03-13 DIAGNOSIS — R000 Tachycardia, unspecified: Secondary | ICD-10-CM | POA: Diagnosis present

## 2020-03-13 DIAGNOSIS — R569 Unspecified convulsions: Secondary | ICD-10-CM | POA: Diagnosis not present

## 2020-03-13 DIAGNOSIS — Y738 Miscellaneous gastroenterology and urology devices associated with adverse incidents, not elsewhere classified: Secondary | ICD-10-CM | POA: Diagnosis not present

## 2020-03-13 DIAGNOSIS — R112 Nausea with vomiting, unspecified: Principal | ICD-10-CM | POA: Insufficient documentation

## 2020-03-13 DIAGNOSIS — I619 Nontraumatic intracerebral hemorrhage, unspecified: Secondary | ICD-10-CM | POA: Diagnosis present

## 2020-03-13 DIAGNOSIS — R1013 Epigastric pain: Secondary | ICD-10-CM

## 2020-03-13 DIAGNOSIS — S069XAA Unspecified intracranial injury with loss of consciousness status unknown, initial encounter: Secondary | ICD-10-CM | POA: Diagnosis present

## 2020-03-13 LAB — SARS CORONAVIRUS 2 BY RT PCR (HOSPITAL ORDER, PERFORMED IN ~~LOC~~ HOSPITAL LAB): SARS Coronavirus 2: NEGATIVE

## 2020-03-13 LAB — COMPREHENSIVE METABOLIC PANEL
ALT: 12 U/L (ref 0–44)
AST: 14 U/L — ABNORMAL LOW (ref 15–41)
Albumin: 4.5 g/dL (ref 3.5–5.0)
Alkaline Phosphatase: 58 U/L (ref 38–126)
Anion gap: 19 — ABNORMAL HIGH (ref 5–15)
BUN: 20 mg/dL (ref 6–20)
CO2: 29 mmol/L (ref 22–32)
Calcium: 10.6 mg/dL — ABNORMAL HIGH (ref 8.9–10.3)
Chloride: 92 mmol/L — ABNORMAL LOW (ref 98–111)
Creatinine, Ser: 0.65 mg/dL (ref 0.61–1.24)
GFR calc Af Amer: >60 mL/min (ref >60)
GFR calc non Af Amer: >60 mL/min (ref >60)
Glucose, Bld: 98 mg/dL (ref 70–99)
Potassium: 3.8 mmol/L (ref 3.5–5.1)
Sodium: 140 mmol/L (ref 135–145)
Total Bilirubin: 0.2 mg/dL — ABNORMAL LOW (ref 0.3–1.2)
Total Protein: 9.2 g/dL — ABNORMAL HIGH (ref 6.5–8.1)

## 2020-03-13 LAB — TSH: TSH: 1.218 u[IU]/mL (ref 0.350–4.500)

## 2020-03-13 LAB — CBC WITH DIFFERENTIAL/PLATELET
Abs Immature Granulocytes: 0.04 10*3/uL (ref 0.00–0.07)
Basophils Absolute: 0 10*3/uL (ref 0.0–0.1)
Basophils Relative: 0 %
Eosinophils Absolute: 0.2 10*3/uL (ref 0.0–0.5)
Eosinophils Relative: 2 %
HCT: 46.7 % (ref 39.0–52.0)
Hemoglobin: 15.1 g/dL (ref 13.0–17.0)
Immature Granulocytes: 0 %
Lymphocytes Relative: 15 %
Lymphs Abs: 1.6 10*3/uL (ref 0.7–4.0)
MCH: 27.1 pg (ref 26.0–34.0)
MCHC: 32.3 g/dL (ref 30.0–36.0)
MCV: 83.7 fL (ref 80.0–100.0)
Monocytes Absolute: 1 10*3/uL (ref 0.1–1.0)
Monocytes Relative: 10 %
Neutro Abs: 7.6 10*3/uL (ref 1.7–7.7)
Neutrophils Relative %: 73 %
Platelets: 350 10*3/uL (ref 150–400)
RBC: 5.58 MIL/uL (ref 4.22–5.81)
RDW: 14.6 % (ref 11.5–15.5)
WBC: 10.4 10*3/uL (ref 4.0–10.5)
nRBC: 0 % (ref 0.0–0.2)

## 2020-03-13 LAB — URINALYSIS, COMPLETE (UACMP) WITH MICROSCOPIC
Bilirubin Urine: NEGATIVE
Glucose, UA: NEGATIVE mg/dL
Hgb urine dipstick: NEGATIVE
Ketones, ur: 5 mg/dL — AB
Leukocytes,Ua: NEGATIVE
Nitrite: NEGATIVE
Protein, ur: NEGATIVE mg/dL
Specific Gravity, Urine: >1.046 — ABNORMAL HIGH (ref 1.005–1.030)
pH: 8 (ref 5.0–8.0)

## 2020-03-13 LAB — LACTIC ACID, PLASMA
Lactic Acid, Venous: 1.2 mmol/L (ref 0.5–1.9)
Lactic Acid, Venous: 1.1 mmol/L (ref 0.5–1.9)

## 2020-03-13 LAB — RAPID URINE DRUG SCREEN, HOSP PERFORMED
Amphetamines: NOT DETECTED
Barbiturates: NOT DETECTED
Benzodiazepines: POSITIVE — AB
Cocaine: NOT DETECTED
Opiates: POSITIVE — AB
Tetrahydrocannabinol: NOT DETECTED

## 2020-03-13 LAB — TROPONIN I (HIGH SENSITIVITY)
Troponin I (High Sensitivity): 3 ng/L (ref <18)
Troponin I (High Sensitivity): 2 ng/L (ref <18)

## 2020-03-13 LAB — LIPASE, BLOOD: Lipase: 46 U/L (ref 11–51)

## 2020-03-13 LAB — MAGNESIUM: Magnesium: 2 mg/dL (ref 1.7–2.4)

## 2020-03-13 LAB — VALPROIC ACID LEVEL: Valproic Acid Lvl: 24 ug/mL — ABNORMAL LOW (ref 50.0–100.0)

## 2020-03-13 MED ORDER — PROPRANOLOL HCL 10 MG PO TABS: 20.0000 mg | ORAL_TABLET | Freq: Four times a day (QID) | ORAL | Status: DC

## 2020-03-13 MED ORDER — POLYETHYLENE GLYCOL 3350 17 G PO PACK: 17.0000 g | PACK | Freq: Every day | ORAL | Status: DC | PRN

## 2020-03-13 MED ORDER — ALBUTEROL SULFATE (2.5 MG/3ML) 0.083% IN NEBU: 2.5000 mg | INHALATION_SOLUTION | RESPIRATORY_TRACT | Status: DC | PRN

## 2020-03-13 MED ORDER — FAMOTIDINE IN NACL 20-0.9 MG/50ML-% IV SOLN
20.0000 mg | Freq: Once | INTRAVENOUS | Status: AC
  Administered 2020-03-13: 20 mg via INTRAVENOUS
  Filled 2020-03-13: qty 50

## 2020-03-13 MED ORDER — ONDANSETRON HCL 4 MG PO TABS: 4.0000 mg | ORAL_TABLET | Freq: Four times a day (QID) | ORAL | Status: DC | PRN

## 2020-03-13 MED ORDER — LEVETIRACETAM 100 MG/ML PO SOLN
1000.0000 mg | Freq: Two times a day (BID) | ORAL | Status: DC
  Administered 2020-03-13 – 2020-03-14 (×2): 1000 mg
  Filled 2020-03-13 (×4): qty 10

## 2020-03-13 MED ORDER — PANTOPRAZOLE SODIUM 40 MG IV SOLR
40.0000 mg | Freq: Two times a day (BID) | INTRAVENOUS | Status: DC
  Administered 2020-03-13 – 2020-03-14 (×2): 40 mg via INTRAVENOUS
  Filled 2020-03-13 (×2): qty 40

## 2020-03-13 MED ORDER — ACETAMINOPHEN 650 MG RE SUPP: 650.0000 mg | Freq: Four times a day (QID) | RECTAL | Status: DC | PRN

## 2020-03-13 MED ORDER — LORAZEPAM 2 MG/ML IJ SOLN: 1.0000 mg | Freq: Four times a day (QID) | INTRAMUSCULAR | Status: DC | PRN

## 2020-03-13 MED ORDER — VITAMIN D 25 MCG (1000 UNIT) PO TABS
2000.0000 [IU] | ORAL_TABLET | Freq: Every morning | ORAL | Status: DC
  Administered 2020-03-14: 2000 [IU]
  Filled 2020-03-13: qty 2

## 2020-03-13 MED ORDER — LORAZEPAM 2 MG/ML IJ SOLN: 1.0000 mg | Freq: Once | INTRAMUSCULAR | Status: DC

## 2020-03-13 MED ORDER — ACETAMINOPHEN 160 MG/5ML PO SOLN: 650.0000 mg | Freq: Four times a day (QID) | ORAL | Status: DC | PRN

## 2020-03-13 MED ORDER — PROPRANOLOL HCL 20 MG PO TABS
20.0000 mg | ORAL_TABLET | Freq: Four times a day (QID) | ORAL | Status: DC
  Administered 2020-03-13: 20 mg
  Filled 2020-03-13: qty 1

## 2020-03-13 MED ORDER — DILTIAZEM HCL-DEXTROSE 125-5 MG/125ML-% IV SOLN (PREMIX)
5.0000 mg/h | INTRAVENOUS | Status: DC
  Administered 2020-03-13: 5 mg/h via INTRAVENOUS
  Administered 2020-03-13: 15 mg/h via INTRAVENOUS
  Filled 2020-03-13 (×2): qty 125

## 2020-03-13 MED ORDER — SODIUM CHLORIDE 0.9 % IV BOLUS
1000.0000 mL | Freq: Once | INTRAVENOUS | Status: AC
  Administered 2020-03-13: 1000 mL via INTRAVENOUS

## 2020-03-13 MED ORDER — CHLORHEXIDINE GLUCONATE CLOTH 2 % EX PADS
6.0000 | MEDICATED_PAD | Freq: Every day | CUTANEOUS | Status: DC
  Administered 2020-03-14: 6 via TOPICAL

## 2020-03-13 MED ORDER — ACETAMINOPHEN 325 MG PO TABS: 650.0000 mg | ORAL_TABLET | Freq: Four times a day (QID) | ORAL | Status: DC | PRN

## 2020-03-13 MED ORDER — ENSURE ENLIVE PO LIQD
237.0000 mL | Freq: Four times a day (QID) | ORAL | Status: DC
  Administered 2020-03-13 – 2020-03-14 (×2): 237 mL via ORAL
  Filled 2020-03-13 (×4): qty 237

## 2020-03-13 MED ORDER — PROSOURCE TF PO LIQD
30.0000 mL | Freq: Two times a day (BID) | ORAL | Status: DC
  Administered 2020-03-13 – 2020-03-14 (×2): 30 mL
  Filled 2020-03-13 (×4): qty 30
  Filled 2020-03-13: qty 45

## 2020-03-13 MED ORDER — TRAZODONE HCL 50 MG PO TABS
50.0000 mg | ORAL_TABLET | Freq: Every evening | ORAL | Status: DC | PRN
  Administered 2020-03-13: 50 mg via ORAL
  Filled 2020-03-13: qty 1

## 2020-03-13 MED ORDER — VALPROIC ACID 250 MG/5ML PO SOLN
500.0000 mg | Freq: Every day | ORAL | Status: DC
  Administered 2020-03-14: 500 mg
  Filled 2020-03-13 (×2): qty 10

## 2020-03-13 MED ORDER — VALPROIC ACID 250 MG/5ML PO SOLN
750.0000 mg | Freq: Every evening | ORAL | Status: DC
  Filled 2020-03-13 (×2): qty 15

## 2020-03-13 MED ORDER — HEPARIN SODIUM (PORCINE) 5000 UNIT/ML IJ SOLN
5000.0000 [IU] | Freq: Three times a day (TID) | INTRAMUSCULAR | Status: DC
  Administered 2020-03-13 – 2020-03-14 (×2): 5000 [IU] via SUBCUTANEOUS
  Filled 2020-03-13 (×2): qty 1

## 2020-03-13 MED ORDER — HYDROCODONE-ACETAMINOPHEN 5-325 MG PO TABS: 1.0000 | ORAL_TABLET | Freq: Four times a day (QID) | ORAL | Status: DC | PRN

## 2020-03-13 MED ORDER — DILTIAZEM LOAD VIA INFUSION
20.0000 mg | Freq: Once | INTRAVENOUS | Status: AC
  Administered 2020-03-13: 20 mg via INTRAVENOUS
  Filled 2020-03-13: qty 20

## 2020-03-13 MED ORDER — SODIUM CHLORIDE 0.9% FLUSH: 3.0000 mL | INTRAVENOUS | Status: DC | PRN

## 2020-03-13 MED ORDER — GABAPENTIN 300 MG PO CAPS
600.0000 mg | ORAL_CAPSULE | Freq: Three times a day (TID) | ORAL | Status: DC
  Administered 2020-03-13 – 2020-03-14 (×2): 600 mg via ORAL
  Filled 2020-03-13 (×2): qty 2

## 2020-03-13 MED ORDER — JEVITY 1.5 CAL/FIBER PO LIQD: 1000.0000 mL | ORAL | Status: DC

## 2020-03-13 MED ORDER — SODIUM CHLORIDE 0.9 % IV SOLN: INTRAVENOUS | Status: DC

## 2020-03-13 MED ORDER — SODIUM CHLORIDE 0.9 % IV SOLN: 250.0000 mL | INTRAVENOUS | Status: DC | PRN

## 2020-03-13 MED ORDER — ONDANSETRON HCL 4 MG/2ML IJ SOLN
4.0000 mg | Freq: Once | INTRAMUSCULAR | Status: AC
  Administered 2020-03-13: 4 mg via INTRAVENOUS
  Filled 2020-03-13: qty 2

## 2020-03-13 MED ORDER — ONDANSETRON HCL 4 MG/2ML IJ SOLN: 4.0000 mg | Freq: Four times a day (QID) | INTRAMUSCULAR | Status: DC | PRN

## 2020-03-13 MED ORDER — TEMAZEPAM 7.5 MG PO CAPS
15.0000 mg | ORAL_CAPSULE | Freq: Every day | ORAL | Status: DC
  Administered 2020-03-13: 15 mg via ORAL
  Filled 2020-03-13: qty 1

## 2020-03-13 MED ORDER — LOPERAMIDE HCL 2 MG PO CAPS: 2.0000 mg | ORAL_CAPSULE | Freq: Four times a day (QID) | ORAL | Status: DC | PRN

## 2020-03-13 MED ORDER — IOHEXOL 350 MG/ML SOLN
100.0000 mL | Freq: Once | INTRAVENOUS | Status: AC | PRN
  Administered 2020-03-13: 100 mL via INTRAVENOUS

## 2020-03-13 MED ORDER — POLYETHYLENE GLYCOL 3350 17 G PO PACK
17.0000 g | PACK | Freq: Every day | ORAL | Status: DC
  Administered 2020-03-14: 17 g via ORAL
  Filled 2020-03-13: qty 1

## 2020-03-13 MED ORDER — SODIUM CHLORIDE 0.9% FLUSH
3.0000 mL | Freq: Two times a day (BID) | INTRAVENOUS | Status: DC
  Administered 2020-03-13 – 2020-03-14 (×2): 3 mL via INTRAVENOUS

## 2020-03-13 NOTE — ED Notes (Signed)
Dr Maurene Capes informed of no urine output, 2nd liter of fluid hung.  Bladder scan is 148 ml.

## 2020-03-13 NOTE — ED Notes (Signed)
Hospitalist to talk to pts sister.

## 2020-03-13 NOTE — ED Notes (Signed)
Pt voided in bed at 1309 during transport. Pt cleaned and sheets were changed. Male external catheter placed.

## 2020-03-13 NOTE — ED Notes (Signed)
When connected to EKG monitor Pt HR was 190's and very irregular.  Dr Melina Copa notified.  Pt stable.  Did c/o chest pain during assessment.
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
## 2020-03-13 NOTE — ED Notes (Signed)
Pt has EG tube.

## 2020-03-13 NOTE — ED Triage Notes (Signed)
Vomiting since yesterday, last episode 2 hours ago.  VSS for EMS.
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
## 2020-03-14 ENCOUNTER — Observation Stay (HOSPITAL_COMMUNITY): Payer: Medicaid Other | Admitting: Anesthesiology

## 2020-03-14 ENCOUNTER — Observation Stay (HOSPITAL_BASED_OUTPATIENT_CLINIC_OR_DEPARTMENT_OTHER): Payer: Medicaid Other

## 2020-03-14 ENCOUNTER — Encounter (HOSPITAL_COMMUNITY)
Admission: EM | Disposition: A | Discharge status: Home / Self Care | Payer: Self-pay | Source: Home / Self Care | Attending: Emergency Medicine

## 2020-03-14 ENCOUNTER — Encounter (HOSPITAL_COMMUNITY): Payer: Self-pay | Admitting: Family Medicine

## 2020-03-14 DIAGNOSIS — R079 Chest pain, unspecified: Secondary | ICD-10-CM | POA: Diagnosis not present

## 2020-03-14 DIAGNOSIS — K269 Duodenal ulcer, unspecified as acute or chronic, without hemorrhage or perforation: Secondary | ICD-10-CM | POA: Diagnosis not present

## 2020-03-14 DIAGNOSIS — K9423 Gastrostomy malfunction: Secondary | ICD-10-CM | POA: Diagnosis not present

## 2020-03-14 DIAGNOSIS — R112 Nausea with vomiting, unspecified: Secondary | ICD-10-CM | POA: Diagnosis not present

## 2020-03-14 DIAGNOSIS — R9389 Abnormal findings on diagnostic imaging of other specified body structures: Secondary | ICD-10-CM | POA: Diagnosis not present

## 2020-03-14 DIAGNOSIS — R109 Unspecified abdominal pain: Secondary | ICD-10-CM | POA: Diagnosis not present

## 2020-03-14 DIAGNOSIS — R9431 Abnormal electrocardiogram [ECG] [EKG]: Secondary | ICD-10-CM | POA: Diagnosis not present

## 2020-03-14 DIAGNOSIS — I4891 Unspecified atrial fibrillation: Secondary | ICD-10-CM | POA: Diagnosis not present

## 2020-03-14 HISTORY — PX: ESOPHAGOGASTRODUODENOSCOPY (EGD) WITH PROPOFOL: SHX5813

## 2020-03-14 HISTORY — PX: PEG PLACEMENT: SHX5437

## 2020-03-14 LAB — ECHOCARDIOGRAM COMPLETE
AR max vel: 2.3 cm2
AV Area VTI: 2.49 cm2
AV Area mean vel: 2.46 cm2
AV Mean grad: 2.4 mmHg
AV Peak grad: 4.6 mmHg
Ao pk vel: 1.08 m/s
Area-P 1/2: 5.02 cm2
Height: 73 in
S' Lateral: 3.01 cm
Weight: 2892.44 oz

## 2020-03-14 LAB — CBC
HCT: 40.7 % (ref 39.0–52.0)
Hemoglobin: 13 g/dL (ref 13.0–17.0)
MCH: 27.1 pg (ref 26.0–34.0)
MCHC: 31.9 g/dL (ref 30.0–36.0)
MCV: 85 fL (ref 80.0–100.0)
Platelets: 310 10*3/uL (ref 150–400)
RBC: 4.79 MIL/uL (ref 4.22–5.81)
RDW: 14.6 % (ref 11.5–15.5)
WBC: 9 10*3/uL (ref 4.0–10.5)
nRBC: 0 % (ref 0.0–0.2)

## 2020-03-14 LAB — BASIC METABOLIC PANEL
Anion gap: 14 (ref 5–15)
BUN: 9 mg/dL (ref 6–20)
CO2: 23 mmol/L (ref 22–32)
Calcium: 9.5 mg/dL (ref 8.9–10.3)
Chloride: 103 mmol/L (ref 98–111)
Creatinine, Ser: 0.53 mg/dL — ABNORMAL LOW (ref 0.61–1.24)
GFR calc Af Amer: >60 mL/min (ref >60)
GFR calc non Af Amer: >60 mL/min (ref >60)
Glucose, Bld: 87 mg/dL (ref 70–99)
Potassium: 3.3 mmol/L — ABNORMAL LOW (ref 3.5–5.1)
Sodium: 140 mmol/L (ref 135–145)

## 2020-03-14 LAB — MRSA PCR SCREENING: MRSA by PCR: NEGATIVE

## 2020-03-14 SURGERY — ESOPHAGOGASTRODUODENOSCOPY (EGD) WITH PROPOFOL
Anesthesia: General

## 2020-03-14 MED ORDER — PROPRANOLOL HCL 20 MG PO TABS
20.0000 mg | ORAL_TABLET | Freq: Three times a day (TID) | ORAL | Status: DC
  Administered 2020-03-14: 20 mg
  Filled 2020-03-14: qty 1

## 2020-03-14 MED ORDER — LIDOCAINE HCL (CARDIAC) PF 50 MG/5ML IV SOSY
PREFILLED_SYRINGE | INTRAVENOUS | Status: DC | PRN
Start: 2020-03-14 — End: 2020-03-14
  Administered 2020-03-14: 80 mg via INTRAVENOUS

## 2020-03-14 MED ORDER — HYDROCODONE-ACETAMINOPHEN 5-325 MG PO TABS: 1.0000 | ORAL_TABLET | Freq: Four times a day (QID) | ORAL | 0 refills | Status: DC | PRN

## 2020-03-14 MED ORDER — STERILE WATER FOR IRRIGATION IR SOLN
Status: DC | PRN
  Administered 2020-03-14: 1.5 mL

## 2020-03-14 MED ORDER — PANTOPRAZOLE SODIUM 40 MG PO TBEC
40.0000 mg | DELAYED_RELEASE_TABLET | Freq: Every day | ORAL | 2 refills | Status: AC
Start: 2020-03-14 — End: 2021-06-17

## 2020-03-14 MED ORDER — ONDANSETRON HCL 4 MG PO TABS: 4.0000 mg | ORAL_TABLET | Freq: Four times a day (QID) | ORAL | 0 refills | Status: DC | PRN

## 2020-03-14 MED ORDER — LACTATED RINGERS IV SOLN: INTRAVENOUS | Status: DC | PRN

## 2020-03-14 MED ORDER — PROPRANOLOL HCL 20 MG PO TABS: 20.0000 mg | ORAL_TABLET | Freq: Three times a day (TID) | ORAL | 4 refills | Status: DC

## 2020-03-14 MED ORDER — TEMAZEPAM 15 MG PO CAPS: 15.0000 mg | ORAL_CAPSULE | Freq: Every day | ORAL | 0 refills | Status: DC

## 2020-03-14 MED ORDER — SODIUM CHLORIDE FLUSH 0.9 % IV SOLN
INTRAVENOUS | Status: AC
  Filled 2020-03-14: qty 10

## 2020-03-14 MED ORDER — PROPOFOL 500 MG/50ML IV EMUL
INTRAVENOUS | Status: DC | PRN
  Administered 2020-03-14: 80 mg via INTRAVENOUS
  Administered 2020-03-14: 150 ug/kg/min via INTRAVENOUS

## 2020-03-14 MED ORDER — LIDOCAINE 2% (20 MG/ML) 5 ML SYRINGE
INTRAMUSCULAR | Status: AC
  Filled 2020-03-14: qty 5
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
## 2020-03-14 NOTE — Discharge Instructions (Signed)
1)Avoid ibuprofen/Advil/Aleve/Motrin/Goody Powders/Naproxen/BC powders/Meloxicam/Diclofenac/Indomethacin and other Nonsteroidal anti-inflammatory medications as these will make you more likely to bleed and can cause stomach ulcers, can also cause Kidney problems.   2)Take Protonix 40 mg daily for stomach erosions/ulcer  3)Increase Propranolol to 20 mg 3 times daily  4)Follow-up with atrial fibrillation ( A. Fib ) clinic advised

## 2020-03-14 NOTE — Progress Notes (Signed)
Patient discharged back to [facility]. Sister aware and at bedside when EMS picked patient up. Tried to call report to [phone] but nobody answered. Discharge paperwork and prescriptions given to EMS to give to [facility].

## 2020-03-14 NOTE — Progress Notes (Signed)
20FR Peg replacement.  Bumper @ 4cm.  Gauze dressing placed @ insertion site.

## 2020-03-14 NOTE — Op Note (Signed)
[facility] Patient Name: Clinton [name] Procedure Date: 03/14/2020 2:02 PM MRN: [id number] Date of Birth: 1996-02-27 Attending MD: Norvel Richards , MD CSN: [id number] Age: 24 Admit Type: Outpatient Procedure:                Upper GI endoscopy Indications:              Nausea with vomiting, Migrated PEG tube producing                            proximal small bowel obstruction Providers:                Norvel Richards, MD, Otis Peak B. Sharon Seller, RN,                            Aram Candela Referring MD:              Medicines:                Propofol per Anesthesia Complications:            No immediate complications. Estimated Blood Loss:     Estimated blood loss: none. Procedure:                After obtaining informed consent, the endoscope was                            passed under direct vision. Throughout the                            procedure, the patient's blood pressure, pulse, and                            oxygen saturations were monitored continuously. The                            GIF-H190 ([id number]) scope was introduced through the                            mouth, and advanced to the third part of duodenum.                            The upper GI endoscopy was accomplished without                            difficulty. The patient tolerated the procedure                            well. Scope In: 2:43:49 PM Scope Out: 2:55:39 PM Total Procedure Duration: 0 hours 11 minutes 50 seconds  Findings:      The examined esophagus was normal.      Examination of the gastric cavity revealed "tenting" of the pylorus and       antrum inward due to traction by the PEG tube. Please see photos. The       balloon was deflated externally and the PEG tube was easily removed.       This immediately relaxed traction on the gastric cavity.  Aside from this       finding, the gastric mucosa appeared entirely normal. Pylorus was       patent. Examination of the first second  third portion of the duodenum       revealed a single large semilunar duodenal ulcer ( please see multiple       photos). This appeared to be benign and pressure related phenomenon from       the balloon. Otherwise, the duodenum appeared normal. I elected to go       ahead and place a new Microvasive angulated 20 French balloon bumper PEG       tube (with an external bumper). This was done under endoscopic and       external control. The balloon was inflated with 6 cc of sterile water.       The external bumper was satisfactorily anchored at 4 cm mark. Please see       photos. Impression:               - Normal esophagus.                           -Findings consistent with migrated PEG tube balloon                            producing partial obstruction via the mechanism                            outlined above.                           Large duodenal ulcer secondary to ischemia via                            mechanism outlined above                           Status post removal of old PEG tube and placement                            of a new PEG tube as described above. Moderate Sedation:      Moderate (conscious) sedation was personally administered by an       anesthesia professional. The following parameters were monitored: oxygen       saturation, heart rate, blood pressure, respiratory rate, EKG, adequacy       of pulmonary ventilation, and response to care. Recommendation:           - Return patient to hospital ward for possible                            discharge same day. Continue PPI for at least a                            month. New PEG tube can be used immediately as                            needed. Suggest advancing diet either per oral or  per PEG tube per attending. Should do well                            clinically. Procedure Code(s):        --- Professional ---                           [phone], Esophagogastroduodenoscopy, flexible,                             transoral; diagnostic, including collection of                            specimen(s) by brushing or washing, when performed                            (separate procedure) Diagnosis Code(s):        --- Professional ---                           R11.2, Nausea with vomiting, unspecified CPT copyright 2019 [employer]. All rights reserved. The codes documented in this report are preliminary and upon coder review may  be revised to meet current compliance requirements. Clinton [name]. Corine Solorio, MD Norvel Richards, MD 03/14/2020 3:42:40 PM This report has been signed electronically. Number of Addenda: 0

## 2020-03-14 NOTE — TOC Transition Note (Signed)
Transition of Care [facility]) - CM/SW Discharge Note   Patient Details  Name: Clinton [name] MRN: [id number] Date of Birth: [birth year]  Transition of Care [facility]) CM/SW Contact:  Shade Flood, LCSW Phone Number: 03/14/2020, 4:29 PM   Clinical Narrative:     Pt with dc orders. Pt admitted from [facility] observation status. Updated Melissa at [facility] of pt's dc. They can accept pt back today. DC clinical sent electronically. RN to call report and EMS when pt ready.  Spoke with pt's sister at her request and answered questions about how to get pt transferred to a different SNF. Clinical referral information sent to [facility] at her request with the understanding that pt will return to [facility] and the Education officer, museum there will continue to assist in trying to get pt transferred to another facility.  There are no other TOC needs for dc.        Patient Goals and CMS Choice        Discharge Placement                       Discharge Plan and Services                                     Social Determinants of Health (SDOH) Interventions     Readmission Risk Interventions Readmission Risk Prevention Plan 01/01/2020  Transportation Screening Complete  PCP or Specialist Appt within 5-7 Days Not Complete  Not Complete comments Pt discharging to SNF  Home Care Screening Not Complete  Home Care Screening Not Completed Comments Pt discharging to SNF  Medication Review (RN CM) Complete  Some recent data might be hidden

## 2020-03-14 NOTE — Discharge Summary (Signed)
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 Discharge time is about 33 minutes  Roxan Hockey M.D on 03/14/2020 at 4:12 PM  Go to [email or web address] -  for contact info  [facility] - Office  [phone]
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
## 2020-03-14 NOTE — Anesthesia Postprocedure Evaluation (Signed)
Anesthesia Post Note  Patient: Kennard W Crysler  Procedure(s) Performed: ESOPHAGOGASTRODUODENOSCOPY (EGD) WITH PROPOFOL (N/A )  Patient location during evaluation: PACU Anesthesia Type: General Level of consciousness: awake, oriented, awake and alert and patient cooperative Pain management: pain level controlled Vital Signs Assessment: post-procedure vital signs reviewed and stable Respiratory status: spontaneous breathing, respiratory function stable, nonlabored ventilation and patient connected to nasal cannula oxygen Cardiovascular status: blood pressure returned to baseline and stable Postop Assessment: no headache and no backache Anesthetic complications: no   No complications documented.   Last Vitals:  Vitals:   03/14/20 1000 03/14/20 1100  BP: 140/60 (!) 146/90  Pulse: 93 99  Resp: 18 15  Temp:    SpO2: 100% 98%    Last Pain:  Vitals:   03/14/20 1422  TempSrc: Oral  PainSc:                  Tacy Learn
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
## 2020-03-14 NOTE — Progress Notes (Signed)
*  PRELIMINARY RESULTS* Echocardiogram 2D Echocardiogram has been performed.  Leavy Cella 03/14/2020, 10:39 AM

## 2020-03-14 NOTE — Transfer of Care (Signed)
Immediate Anesthesia Transfer of Care Note  Patient: Clinton [name]  Procedure(s) Performed: ESOPHAGOGASTRODUODENOSCOPY (EGD) WITH PROPOFOL (N/A )  Patient Location: PACU  Anesthesia Type:General  Level of Consciousness: awake, alert , drowsy and responds to stimulation  Airway & Oxygen Therapy: Patient Spontanous Breathing and Patient connected to nasal cannula oxygen  Post-op Assessment: Report given to RN and Post -op Vital signs reviewed and stable  Post vital signs: Reviewed and stable  Last Vitals:  Vitals Value Taken Time  BP 85/44 03/14/20 1500  Temp    Pulse 87 03/14/20 1502  Resp 18 03/14/20 1502  SpO2 99 % 03/14/20 1502  Vitals shown include unvalidated device data.  Last Pain:  Vitals:   03/14/20 1422  TempSrc: Oral  PainSc:          Complications: No complications documented.

## 2020-03-19 LAB — CULTURE, BLOOD (ROUTINE X 2)
Culture: NO GROWTH
Culture: NO GROWTH
Special Requests: ADEQUATE
Special Requests: ADEQUATE

## 2020-03-20 ENCOUNTER — Encounter (HOSPITAL_COMMUNITY): Payer: Self-pay | Admitting: Internal Medicine

## 2020-04-17 ENCOUNTER — Ambulatory Visit: Payer: Medicaid Other | Admitting: Cardiology

## 2020-04-22 NOTE — Progress Notes (Deleted)
Cardiology Office Note  Date: 04/22/2020   ID: [name], [name] 16-Dec-1995, MRN [id number]  PCP:  [facility] [place]  Cardiologist:  No primary care provider on file. Electrophysiologist:  None   Chief Complaint: hospital follow up atrial fibrillation, tachycardia, chest pain  History of Present Illness: Clinton [name] is a 24 y.o. male with a history of atrial fibrillation, tachycardia, chest pain, TBI, convulsion/seizures, ICH, Abnormal CT  Recent presentation with fatigue , persistent emesis, and atrial fibrillation with RVR with rate 180-190s. Cardizem gtt started. Converted back to NSR. Had CTA chest with possible enteritis versus gastric ulcer. No PE.   Past Medical History:  Diagnosis Date  . ADHD (attention deficit hyperactivity disorder)   . Anxiety   . Bipolar disorder ([place])   . 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 Headache(784.0)   . Memory loss, short term   . Seizures ([place])     Past Surgical History:  Procedure Laterality Date  . CRANIOPLASTY Right 01/19/2014   Procedure: Right Cranioplasty with replacement of bone flap from abdomen;  Surgeon: Erline Levine, MD;  Location: [place] [facility];  Service: Neurosurgery;  Laterality: Right;  Right Cranioplasty with replacement of bone flap from abdomen  . CRANIOTOMY  09/22/2013   Procedure: Decompressive Craniectomy with ICP monitor placement and bone flap placement into abdomen;  Surgeon: Erline Levine, MD;  Location: [facility];  Service: Neurosurgery;;  Decompressive Craniectomy with ICP monitor placement and bone flap placement into abdomen  . ESOPHAGOGASTRODUODENOSCOPY N/A 10/02/2013   Procedure: ESOPHAGOGASTRODUODENOSCOPY (EGD);  Surgeon: Gwenyth Ober, MD;  Location: [facility];  Service: General;  Laterality: N/A;  . ESOPHAGOGASTRODUODENOSCOPY N/A 12/14/2019   Procedure: ESOPHAGOGASTRODUODENOSCOPY (EGD);  Surgeon: Georganna Skeans, MD;  Location: [place];  Service: General;   Laterality: N/A;  . ESOPHAGOGASTRODUODENOSCOPY (EGD) WITH PROPOFOL N/A 03/14/2020   Procedure: ESOPHAGOGASTRODUODENOSCOPY (EGD) WITH PROPOFOL;  Surgeon: Daneil Dolin, MD;  [facility];  Service: Endoscopy;  Laterality: N/A;  patient needs to be done after 2pm. received heparin [place] at 645 am, ate 30% dysphagia 1 diet at 8am  . ORIF CLAVICULAR FRACTURE Right 07/09/2015   Procedure: OPEN REDUCTION INTERNAL FIXATION (ORIF) CLAVICULAR FRACTURE;  Surgeon: Renette Butters, MD;  Location: [facility];  Service: Orthopedics;  Laterality: Right;  . PEG PLACEMENT N/A 10/02/2013   Procedure: PERCUTANEOUS ENDOSCOPIC GASTROSTOMY (PEG) PLACEMENT;  Surgeon: Gwenyth Ober, MD;  Location: [facility];  Service: General;  Laterality: N/A;  . PEG PLACEMENT N/A 12/14/2019   Procedure: PERCUTANEOUS ENDOSCOPIC GASTROSTOMY (PEG) PLACEMENT;  Surgeon: Georganna Skeans, MD;  Location: [facility];  Service: General;  Laterality: N/A;  . PEG PLACEMENT N/A 03/14/2020   Procedure: PERCUTANEOUS ENDOSCOPIC GASTROSTOMY (PEG) REPLACEMENT;  Surgeon: Daneil Dolin, MD;  [facility];  Service: Endoscopy;  Laterality: N/A;  . PERCUTANEOUS TRACHEOSTOMY N/A 10/02/2013   Procedure: PERCUTANEOUS TRACHEOSTOMY - BEDSIDE;  Surgeon: Gwenyth Ober, MD;  Location: [facility];  Service: General;  Laterality: N/A;  . TIBIA IM NAIL INSERTION Left 11/30/2019   Procedure: INTRAMEDULLARY LEFT TIBIAL NAIL;  Surgeon: Shona Needles, MD;  Location: [facility];  Service: Orthopedics;  Laterality: Left;  . TRACHEOSTOMY TUBE PLACEMENT N/A 12/14/2019   Procedure: OPEN TRACHEOSTOMY;  Surgeon: Georganna Skeans, MD;  Location: [facility];  Service: General;  Laterality: N/A;    Current Outpatient Medications  Medication Sig Dispense Refill  . acetaminophen (TYLENOL) 160 MG/5ML solution Place 20.3 mLs (650  mg total) into feeding tube every 6 (six) hours as needed for mild pain. 120 mL 0  . Amino Acids-Protein Hydrolys (FEEDING SUPPLEMENT, PRO-STAT SUGAR FREE 64,)  LIQD Place 30 mLs into feeding tube 2 (two) times daily. 887 mL 0  . Cholecalciferol (VITAMIN D3) 50 MCG (2000 UT) TABS Place 2,000 Units into feeding tube every morning.    . gabapentin (NEURONTIN) 600 MG tablet Take 600 mg by mouth in the morning, at noon, in the evening, and at bedtime.    [place] Kitchen HYDROcodone-acetaminophen (NORCO/VICODIN) 5-325 MG tablet Take 1 tablet by mouth every 6 (six) hours as needed for moderate pain or severe pain. 10 tablet 0  . levETIRAcetam (KEPPRA) 100 MG/ML solution Place 10 mLs (1,000 mg total) into feeding tube 2 (two) times daily. 473 mL 12  . loperamide (IMODIUM) 2 MG capsule Take 1 capsule (2 mg total) by mouth 4 (four) times daily as needed for diarrhea or loose stools. (Patient not taking: Reported on 01/06/2020) 12 capsule 0  . Nutritional Supplements (FEEDING SUPPLEMENT, JEVITY 1.5 CAL/FIBER,) LIQD Place 1,000 mLs into feeding tube continuous. (Patient not taking: Reported on 03/13/2020)    . ondansetron (ZOFRAN) 4 MG tablet Take 1 tablet (4 mg total) by mouth every 6 (six) hours as needed for nausea or vomiting. 20 tablet 0  . pantoprazole (PROTONIX) 40 MG tablet Take 1 tablet (40 mg total) by mouth daily. 30 tablet 2  . polyethylene glycol (MIRALAX / GLYCOLAX) 17 g packet Take 17 g by mouth daily. (Patient taking differently: Place 17 g into feeding tube daily. ) 14 each 0  . propranolol (INDERAL) 20 MG tablet Place 1 tablet (20 mg total) into feeding tube 3 (three) times daily. 90 tablet 4  . temazepam (RESTORIL) 15 MG capsule Take 1 capsule (15 mg total) by mouth at bedtime. 10 capsule 0  . valproic acid (DEPAKENE) 250 MG/5ML solution Place 10 mLs (500 mg total) into feeding tube daily. 600 mL   . valproic acid (DEPAKENE) 250 MG/5ML solution Place 15 mLs (750 mg total) into feeding tube every evening. 600 mL    No current facility-administered medications for this visit.   Allergies:  Morphine and related, Seroquel [quetiapine], Seroquel [quetiapine fumarate],  Latex, and Morphine and related   Social History: The patient  reports that he has been smoking cigarettes. He has a 0.45 pack-year smoking history. He has never used smokeless tobacco. He reports current alcohol use of about 9.0 standard drinks of alcohol per week. He reports current drug use. Drug: Marijuana.   Family History: The patient's family history includes Heart attack in his maternal grandfather and mother; Stroke in his maternal grandmother.   ROS:  Please see the history of present illness. Otherwise, complete review of systems is positive for {NONE DEFAULTED:18576::"none"}.  All other systems are reviewed and negative.   Physical Exam: VS:  There were no vitals taken for this visit., BMI There is no height or weight on file to calculate BMI.  Wt Readings from Last 3 Encounters:  03/13/20 180 lb 12.4 oz (82 kg)  01/06/20 180 lb 12.4 oz (82 kg)  01/01/20 181 lb 3.5 oz (82.2 kg)    General: Patient appears comfortable at rest. HEENT: Conjunctiva and lids normal, oropharynx clear with moist mucosa. Neck: Supple, no elevated JVP or carotid bruits, no thyromegaly. Lungs: Clear to auscultation, nonlabored breathing at rest. Cardiac: Regular rate and rhythm, no S3 or significant systolic murmur, no pericardial rub. Abdomen: Soft, nontender, no hepatomegaly,  bowel sounds present, no guarding or rebound. Extremities: No pitting edema, distal pulses 2+. Skin: Warm and dry. Musculoskeletal: No kyphosis. Neuropsychiatric: Alert and oriented x3, affect grossly appropriate.  ECG:  {EKG/Telemetry Strips [phone]}  Recent Labwork: 03/13/2020: ALT 12; AST 14; Magnesium 2.0; TSH 1.218 03/14/2020: BUN 9; Creatinine, Ser 0.53; Hemoglobin 13.0; Platelets 310; Potassium 3.3; Sodium 140     Component Value Date/Time   TRIG 43 12/05/2019 0450    Other Studies Reviewed Today:   Assessment and Plan:  No diagnosis found.   Medication Adjustments/Labs and Tests Ordered: Current  medicines are reviewed at length with the patient today.  Concerns regarding medicines are outlined above.   Disposition: Follow-up with ***  Signed, Levell July, NP 04/22/2020 11:00 PM    [facility] [place], [place], [place] [address] Phone: [phone]; Fax: [phone]

## 2020-04-23 ENCOUNTER — Ambulatory Visit: Payer: Medicaid Other | Admitting: Family Medicine
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
## 2020-05-02 ENCOUNTER — Encounter: Payer: Self-pay | Admitting: Family Medicine

## 2020-05-02 ENCOUNTER — Ambulatory Visit (INDEPENDENT_AMBULATORY_CARE_PROVIDER_SITE_OTHER): Payer: Medicaid Other | Admitting: Family Medicine

## 2020-05-02 VITALS — BP 115/78 | HR 96

## 2020-05-02 DIAGNOSIS — I4891 Unspecified atrial fibrillation: Secondary | ICD-10-CM | POA: Diagnosis not present

## 2020-05-02 NOTE — Patient Instructions (Signed)
Medication Instructions:  Continue all current medications.  Labwork: none  Testing/Procedures: none  Follow-Up: As needed.    Any Other Special Instructions Will Be Listed Below (If Applicable).  If you need a refill on your cardiac medications before your next appointment, please call your pharmacy.  

## 2020-06-12 ENCOUNTER — Telehealth: Payer: Self-pay | Admitting: Internal Medicine

## 2020-06-12 NOTE — Telephone Encounter (Signed)
[facility] [phone], carol called wanting to schedule this patient for an appointment. Patient had endo done in august as inpatient by dr. Gala Romney.  They said he also has a peg tube    The caller did not know if he was having any GI issues.  Was he supposed to have a follow up?

## 2020-06-12 NOTE — Telephone Encounter (Signed)
Clinton [name] called back and said that he is no longer using his peg tube and they need it removed.  Does that need to be done at the location it was placed?

## 2020-06-14 NOTE — Telephone Encounter (Signed)
I spoke with Dr.Rourk, we can see him here for his GI issues but we do not manage PEGs anymore. Please see other phone note if he needs to be seen for his PEG.

## 2020-06-14 NOTE — Telephone Encounter (Signed)
Yes, it was placed by Dr.Wyatt at [facility].  I think he is in IR.

## 2020-06-18 NOTE — Telephone Encounter (Signed)
Notified Nurse at [facility]

## 2020-07-01 ENCOUNTER — Telehealth (HOSPITAL_COMMUNITY): Payer: Self-pay | Admitting: Radiology

## 2020-07-01 NOTE — Telephone Encounter (Signed)
Tried to call [facility], no answer, no VM. Per Dr. Corrie Mckusick, PEG will need to be removed by either Dr. Hulen Skains (general surgery) or Dr. Gala Romney (GI) as IR did not place it. JM

## 2020-07-01 NOTE — Care Plan (Signed)
Received an order from [facility] for PEG removal. Per Dr. Corrie Mckusick in IR the PEG will need to be removed by either Dr. Hulen Skains who placed it or GI who replaced it in August. JM

## 2020-08-15 ENCOUNTER — Encounter: Payer: Self-pay | Admitting: General Surgery

## 2020-08-15 ENCOUNTER — Ambulatory Visit (INDEPENDENT_AMBULATORY_CARE_PROVIDER_SITE_OTHER): Payer: Medicaid Other | Admitting: General Surgery

## 2020-08-15 ENCOUNTER — Other Ambulatory Visit: Payer: Self-pay

## 2020-08-15 VITALS — BP 108/65 | HR 89 | Temp 98.4°F | Resp 16

## 2020-08-15 DIAGNOSIS — Z431 Encounter for attention to gastrostomy: Secondary | ICD-10-CM

## 2020-08-15 NOTE — Patient Instructions (Signed)
Keep dressing in place for 48-72 hours and replace as needed. The gastrostomy tube site should close up in the next few days.  Diet as tolerated .
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
## 2021-02-20 ENCOUNTER — Other Ambulatory Visit: Payer: Self-pay

## 2021-02-20 ENCOUNTER — Ambulatory Visit (INDEPENDENT_AMBULATORY_CARE_PROVIDER_SITE_OTHER): Payer: Medicaid Other | Admitting: Urology

## 2021-02-20 ENCOUNTER — Encounter: Payer: Self-pay | Admitting: Urology

## 2021-02-20 VITALS — Ht 68.0 in | Wt 152.0 lb

## 2021-02-20 DIAGNOSIS — R9349 Abnormal radiologic findings on diagnostic imaging of other urinary organs: Secondary | ICD-10-CM

## 2021-02-20 DIAGNOSIS — Z87440 Personal history of urinary (tract) infections: Secondary | ICD-10-CM | POA: Diagnosis not present

## 2021-02-20 DIAGNOSIS — N35919 Unspecified urethral stricture, male, unspecified site: Secondary | ICD-10-CM | POA: Diagnosis not present

## 2021-02-20 DIAGNOSIS — R339 Retention of urine, unspecified: Secondary | ICD-10-CM | POA: Diagnosis not present

## 2021-02-20 LAB — BLADDER SCAN AMB NON-IMAGING: Scan Result: 250

## 2021-02-20 NOTE — Progress Notes (Signed)
PVR 250

## 2021-02-20 NOTE — Progress Notes (Signed)
Urological Symptom Review  Patient is experiencing the following symptoms: Urinary tract infection   Review of Systems  Gastrointestinal (upper)  : Indigestion/heartburn  Gastrointestinal (lower) : Negative for lower GI symptoms  Constitutional : Negative for symptoms  Skin: Negative for skin symptoms  Eyes: Negative for eye symptoms  Ear/Nose/Throat : Negative for Ear/Nose/Throat symptoms  Hematologic/Lymphatic: Negative for Hematologic/Lymphatic symptoms  Cardiovascular : Chest pain  Respiratory : Negative for respiratory symptoms  Endocrine: Negative for endocrine symptoms  Musculoskeletal: Negative for musculoskeletal symptoms  Neurological: Headaches  Psychologic: Depression Anxiety
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
## 2021-03-13 ENCOUNTER — Ambulatory Visit (HOSPITAL_COMMUNITY)
Admission: RE | Admit: 2021-03-13 | Discharge: 2021-03-13 | Disposition: A | Discharge status: Home / Self Care | Payer: Medicaid Other | Source: Ambulatory Visit | Attending: Urology | Admitting: Urology

## 2021-03-13 ENCOUNTER — Other Ambulatory Visit: Payer: Self-pay

## 2021-03-13 DIAGNOSIS — R9349 Abnormal radiologic findings on diagnostic imaging of other urinary organs: Secondary | ICD-10-CM | POA: Diagnosis not present

## 2021-03-13 MED ORDER — IOHEXOL 350 MG/ML SOLN
75.0000 mL | Freq: Once | INTRAVENOUS | Status: AC | PRN
  Administered 2021-03-13: 75 mL via INTRAVENOUS

## 2021-03-14 NOTE — Progress Notes (Signed)
Sent via mail 

## 2021-04-03 ENCOUNTER — Other Ambulatory Visit: Payer: Self-pay

## 2021-04-03 ENCOUNTER — Ambulatory Visit: Payer: Medicaid Other | Admitting: Urology

## 2021-04-03 DIAGNOSIS — R339 Retention of urine, unspecified: Secondary | ICD-10-CM

## 2021-05-29 ENCOUNTER — Other Ambulatory Visit: Payer: Self-pay

## 2021-05-29 ENCOUNTER — Encounter: Payer: Self-pay | Admitting: Urology

## 2021-05-29 ENCOUNTER — Ambulatory Visit (INDEPENDENT_AMBULATORY_CARE_PROVIDER_SITE_OTHER): Payer: Medicaid Other | Admitting: Urology

## 2021-05-29 VITALS — BP 102/71 | HR 82 | Temp 97.4°F

## 2021-05-29 DIAGNOSIS — N210 Calculus in bladder: Secondary | ICD-10-CM

## 2021-05-29 DIAGNOSIS — R339 Retention of urine, unspecified: Secondary | ICD-10-CM

## 2021-05-29 DIAGNOSIS — Z87440 Personal history of urinary (tract) infections: Secondary | ICD-10-CM | POA: Diagnosis not present

## 2021-05-29 DIAGNOSIS — T839XXA Unspecified complication of genitourinary prosthetic device, implant and graft, initial encounter: Secondary | ICD-10-CM
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
## 2021-05-29 NOTE — Progress Notes (Signed)

## 2021-05-30 ENCOUNTER — Telehealth: Payer: Self-pay

## 2021-06-10 NOTE — Patient Instructions (Signed)
[place]  06/10/2021     @PREFPERIOPPHARMACY @   Your procedure is scheduled on  06/16/2021.   Report to [facility] at  0800 A.M.   Call this number if you have problems the morning of surgery:  [phone]   Remember:  Do not eat or drink after midnight.      Take these medicines the morning of surgery with A SIP OF WATER          gabapentin, hydrocodone (if needed), keppra, zofran (if needed), protonix, propranolol, depakote, zoloft.     Do not wear jewelry, make-up or nail polish.  Do not wear lotions, powders, or perfumes, or deodorant.  Do not shave 48 hours prior to surgery.  Men may shave face and neck.  Do not bring valuables to the hospital.  [facility] is not responsible for any belongings or valuables.  Contacts, dentures or bridgework may not be worn into surgery.  Leave your suitcase in the car.  After surgery it may be brought to your room.  For patients admitted to the hospital, discharge time will be determined by your treatment team.  Patients discharged the day of surgery will not be allowed to drive home and must have someone with them for 24 hours.   Special instructions:        DO NOT smoke tobacco or vape for 24 hours before your procedure.  Please read over the following fact sheets that you were given. Coughing and Deep Breathing, Surgical Site Infection Prevention, Anesthesia Post-op Instructions, and Care and Recovery After Surgery      Urethral Dilation Urethral dilation is a procedure to stretch open (dilate) the urethra. The urethra is the tube that drains urine from the bladder out of the body. In women, the urethra opens above the vaginal opening. In men, the urethra opens at the tip of the penis. Urethral dilation is usually done to treat narrowing of the urethra (urethral stricture), which can make it difficult to pass urine. Urethral dilation widens the urethra so that you can pass urine normally. Urethral  dilation is done through the urethral opening. There are no incisions made during the procedure. Tell a health care provider about: Any allergies you have. All medicines you are taking, including vitamins, herbs, eye drops, creams, and over-the-counter medicines. Any problems you or family members have had with anesthetic medicines. Any blood disorders you have. Any surgeries you have had. Any medical conditions you have. Whether you are pregnant or may be pregnant. What are the risks? Generally, this is a safe procedure. However, problems may occur, including: Bleeding. Infection. A return of urethral stricture, which requires repeating the dilation procedure. Damage to the urethra, which may require reconstructive surgery. Allergic reactions to medicines. What happens before the procedure? Medicines Ask your health care provider about: Changing or stopping your regular medicines. This is especially important if you are taking diabetes medicines or blood thinners. Taking medicines such as aspirin and ibuprofen. These medicines can thin your blood. Do not take these medicines unless your health care provider tells you to take them. Taking over-the-counter medicines, vitamins, herbs, and supplements. General instructions Follow instructions from your health care provider about eating or drinking restrictions. Plan to have someone take you home from the hospital or clinic. If you will be going home right after the procedure, plan to have someone with you for 24 hours. Ask your health care provider what steps will  be taken to help prevent infection. These may include: Washing skin with a germ-killing soap. Taking antibiotic medicine. What happens during the procedure? An IV may be inserted into one of your veins. You will be given one or more of the following medicines: A local anesthetic to numb your urethral opening. This will be applied as a gel that will also lubricate the urethral  opening. A sedative to help you relax. A thin tube with a light and camera on the end (cystoscope) will be inserted into your urethra. Your urethra will be rinsed (irrigated) with a germ-free (sterile) water solution. Narrow parts of your urethra will be stretched open using a dilator tool. Your surgeon will start with a very thin dilator, then use wider dilators as needed. A thin tube with an inflatable balloon on the tip may be inserted into your urethra. The balloon may be inflated to help stretch your urethra open. Your urethra will be irrigated. The procedure may vary among health care providers and hospitals. What can I expect after the procedure? After the procedure, it is common to have: Burning pain when urinating. Blood in your urine. A need to urinate frequently. You will be asked to urinate before you leave the hospital or clinic. Your urine flow should improve within a few days. Follow these instructions at home: Medicines Take over-the-counter and prescription medicines only as told by your health care provider. If you were prescribed an antibiotic medicine, take it as told by your health care provider. Do not stop taking the antibiotic even if you start to feel better. Ask your health care provider if the medicine prescribed to you: Requires you to avoid driving or using heavy machinery. Can cause constipation. You may need to take these actions to prevent or treat constipation: Take over-the-counter or prescription medicines. Eat foods that are high in fiber, such as beans, whole grains, and fresh fruits and vegetables. Limit foods that are high in fat and processed sugars, such as fried or sweet foods. General instructions Do not drive for 24 hours if you were given a sedative during your procedure. If you were sent home with a small, lubricated tube (catheter) to help keep your urethra open, follow your health care provider's instructions about how and when to use  it. Drink enough fluid to keep your urine pale yellow. Return to your normal activities as told by your health care provider. Ask your health care provider what activities are safe for you. Keep all follow-up visits as told by your health care provider. This is important. Contact a health care provider if: Your urine is cloudy and smells bad. You develop new bleeding when you urinate. You pass blood clots when you urinate. You have pain that does not get better with medicine. You have a fever. You have swelling, bruising, or discoloration of your genital area. This includes the penis, scrotum, and inner thighs for men, and the outer genital organs (vulva) and inner thighs for women. Get help right away if: You develop new bleeding that does not stop. You cannot pass urine. Summary Urethral dilation is a procedure to stretch open (dilate) the urethra. Urethral dilation is usually done to treat narrowing of the urethra (urethral stricture), which can make it difficult to pass urine. Ask your health care provider about changing or stopping your regular medicines before the procedure. After the procedure, it is common to have burning pain when urinating, blood in your urine, and a need to urinate frequently. This information is  not intended to replace advice given to you by your health care provider. Make sure you discuss any questions you have with your health care provider. Document Revised: 09/08/2018 Document Reviewed: 09/08/2018 Elsevier Patient Education  2022 [place]. Cystoscopy Cystoscopy is a procedure that is used to help diagnose and sometimes treat conditions that affect the lower urinary tract. The lower urinary tract includes the bladder and the urethra. The urethra is the tube that drains urine from the bladder. Cystoscopy is done using a thin, tube-shaped instrument with a light and camera at the end (cystoscope). The cystoscope may be hard or flexible, depending on the goal  of the procedure. The cystoscope is inserted through the urethra, into the bladder. Cystoscopy may be recommended if you have: Urinary tract infections that keep coming back. Blood in the urine (hematuria). An inability to control when you urinate (urinary incontinence) or an overactive bladder. Unusual cells found in a urine sample. A blockage in the urethra, such as a urinary stone. Painful urination. An abnormality in the bladder found during an intravenous pyelogram (IVP) or CT scan. Cystoscopy may also be done to remove a sample of tissue to be examined under a microscope (biopsy). Tell a health care provider about: Any allergies you have. All medicines you are taking, including vitamins, herbs, eye drops, creams, and over-the-counter medicines. Any problems you or family members have had with anesthetic medicines. Any blood disorders you have. Any surgeries you have had. Any medical conditions you have. Whether you are pregnant or may be pregnant. What are the risks? Generally, this is a safe procedure. However, problems may occur, including: Infection. Bleeding. Allergic reactions to medicines. Damage to other structures or organs. What happens before the procedure? Medicines Ask your health care provider about: Changing or stopping your regular medicines. This is especially important if you are taking diabetes medicines or blood thinners. Taking medicines such as aspirin and ibuprofen. These medicines can thin your blood. Do not take these medicines unless your health care provider tells you to take them. Taking over-the-counter medicines, vitamins, herbs, and supplements. Tests You may have an exam or testing, such as: X-rays of the bladder, urethra, or kidneys. CT scan of the abdomen or pelvis. Urine tests to check for signs of infection. General instructions Follow instructions from your health care provider about eating or drinking restrictions. Ask your health care  provider what steps will be taken to help prevent infection. These steps may include: Washing skin with a germ-killing soap. Taking antibiotic medicine. Plan to have a responsible adult take you home from the hospital or clinic. What happens during the procedure?  You will be given one or more of the following: A medicine to help you relax (sedative). A medicine to numb the area (local anesthetic). The area around the opening of your urethra will be cleaned. The cystoscope will be passed through your urethra into your bladder. Germ-free (sterile) fluid will flow through the cystoscope to fill your bladder. The fluid will stretch your bladder so that your health care provider can clearly examine your bladder walls. Your doctor will look at the urethra and bladder. Your doctor may take a biopsy or remove stones. The cystoscope will be removed, and your bladder will be emptied. The procedure may vary among health care providers and hospitals. What can I expect after the procedure? After the procedure, it is common to have: Some soreness or pain in your abdomen and urethra. Urinary symptoms. These include: Mild pain or burning when  you urinate. Pain should stop within a few minutes after you urinate. This may last for up to 1 week. A small amount of blood in your urine for several days. Feeling like you need to urinate but producing only a small amount of urine. Follow these instructions at home: Medicines Take over-the-counter and prescription medicines only as told by your health care provider. If you were prescribed an antibiotic medicine, take it as told by your health care provider. Do not stop taking the antibiotic even if you start to feel better. General instructions Return to your normal activities as told by your health care provider. Ask your health care provider what activities are safe for you. If you were given a sedative during the procedure, it can affect you for several  hours. Do not drive or operate machinery until your health care provider says that it is safe. Watch for any blood in your urine. If the amount of blood in your urine increases, call your health care provider. Follow instructions from your health care provider about eating or drinking restrictions. If a tissue sample was removed for testing (biopsy) during your procedure, it is up to you to get your test results. Ask your health care provider, or the department that is doing the test, when your results will be ready. Drink enough fluid to keep your urine pale yellow. Keep all follow-up visits. This is important. Contact a health care provider if: You have pain that gets worse or does not get better with medicine, especially pain when you urinate. You have trouble urinating. You have more blood in your urine. Get help right away if: You have blood clots in your urine. You have abdominal pain. You have a fever or chills. You are unable to urinate. Summary Cystoscopy is a procedure that is used to help diagnose and sometimes treat conditions that affect the lower urinary tract. Cystoscopy is done using a thin, tube-shaped instrument with a light and camera at the end. After the procedure, it is common to have some soreness or pain in your abdomen and urethra. Watch for any blood in your urine. If the amount of blood in your urine increases, call your health care provider. If you were prescribed an antibiotic medicine, take it as told by your health care provider. Do not stop taking the antibiotic even if you start to feel better. This information is not intended to replace advice given to you by your health care provider. Make sure you discuss any questions you have with your health care provider. Document Revised: 03/08/2020 Document Reviewed: 03/08/2020 Elsevier Patient Education  [place] Anesthesia, Adult, Care After This sheet gives you information about how to care for  yourself after your procedure. Your health care provider may also give you more specific instructions. If you have problems or questions, contact your health care provider. What can I expect after the procedure? After the procedure, the following side effects are common: Pain or discomfort at the IV site. Nausea. Vomiting. Sore throat. Trouble concentrating. Feeling cold or chills. Feeling weak or tired. Sleepiness and fatigue. Soreness and body aches. These side effects can affect parts of the body that were not involved in surgery. Follow these instructions at home: For the time period you were told by your health care provider:  Rest. Do not participate in activities where you could fall or become injured. Do not drive or use machinery. Do not drink alcohol. Do not take sleeping pills or medicines that cause  drowsiness. Do not make important decisions or sign legal documents. Do not take care of children on your own. Eating and drinking Follow any instructions from your health care provider about eating or drinking restrictions. When you feel hungry, start by eating small amounts of foods that are soft and easy to digest (bland), such as toast. Gradually return to your regular diet. Drink enough fluid to keep your urine pale yellow. If you vomit, rehydrate by drinking water, juice, or clear broth. General instructions If you have sleep apnea, surgery and certain medicines can increase your risk for breathing problems. Follow instructions from your health care provider about wearing your sleep device: Anytime you are sleeping, including during daytime naps. While taking prescription pain medicines, sleeping medicines, or medicines that make you drowsy. Have a responsible adult stay with you for the time you are told. It is important to have someone help care for you until you are awake and alert. Return to your normal activities as told by your health care provider. Ask your health  care provider what activities are safe for you. Take over-the-counter and prescription medicines only as told by your health care provider. If you smoke, do not smoke without supervision. Keep all follow-up visits as told by your health care provider. This is important. Contact a health care provider if: You have nausea or vomiting that does not get better with medicine. You cannot eat or drink without vomiting. You have pain that does not get better with medicine. You are unable to pass urine. You develop a skin rash. You have a fever. You have redness around your IV site that gets worse. Get help right away if: You have difficulty breathing. You have chest pain. You have blood in your urine or stool, or you vomit blood. Summary After the procedure, it is common to have a sore throat or nausea. It is also common to feel tired. Have a responsible adult stay with you for the time you are told. It is important to have someone help care for you until you are awake and alert. When you feel hungry, start by eating small amounts of foods that are soft and easy to digest (bland), such as toast. Gradually return to your regular diet. Drink enough fluid to keep your urine pale yellow. Return to your normal activities as told by your health care provider. Ask your health care provider what activities are safe for you. This information is not intended to replace advice given to you by your health care provider. Make sure you discuss any questions you have with your health care provider. Document Revised: 04/11/2020 Document Reviewed: 11/09/2019 Elsevier Patient Education  2022 [place]. How to Use Chlorhexidine for Bathing Chlorhexidine gluconate (CHG) is a germ-killing (antiseptic) solution that is used to clean the skin. It can get rid of the bacteria that normally live on the skin and can keep them away for about 24 hours. To clean your skin with CHG, you may be given: A CHG solution to use  in the shower or as part of a sponge bath. A prepackaged cloth that contains CHG. Cleaning your skin with CHG may help lower the risk for infection: While you are staying in the intensive care unit of the hospital. If you have a vascular access, such as a central line, to provide short-term or long-term access to your veins. If you have a catheter to drain urine from your bladder. If you are on a ventilator. A ventilator is a machine  that helps you breathe by moving air in and out of your lungs. After surgery. What are the risks? Risks of using CHG include: A skin reaction. Hearing loss, if CHG gets in your ears and you have a perforated eardrum. Eye injury, if CHG gets in your eyes and is not rinsed out. The CHG product catching fire. Make sure that you avoid smoking and flames after applying CHG to your skin. Do not use CHG: If you have a chlorhexidine allergy or have previously reacted to chlorhexidine. On babies younger than 53 months of age. How to use CHG solution Use CHG only as told by your health care provider, and follow the instructions on the label. Use the full amount of CHG as directed. Usually, this is one bottle. During a shower Follow these steps when using CHG solution during a shower (unless your health care provider gives you different instructions): Start the shower. Use your normal soap and shampoo to wash your face and hair. Turn off the shower or move out of the shower stream. Pour the CHG onto a clean washcloth. Do not use any type of brush or rough-edged sponge. Starting at your neck, lather your body down to your toes. Make sure you follow these instructions: If you will be having surgery, pay special attention to the part of your body where you will be having surgery. Scrub this area for at least 1 minute. Do not use CHG on your head or face. If the solution gets into your ears or eyes, rinse them well with water. Avoid your genital area. Avoid any areas of  skin that have broken skin, cuts, or scrapes. Scrub your back and under your arms. Make sure to wash skin folds. Let the lather sit on your skin for 1-2 minutes or as long as told by your health care provider. Thoroughly rinse your entire body in the shower. Make sure that all body creases and crevices are rinsed well. Dry off with a clean towel. Do not put any substances on your body afterward--such as powder, lotion, or perfume--unless you are told to do so by your health care provider. Only use lotions that are recommended by the manufacturer. Put on clean clothes or pajamas. If it is the night before your surgery, sleep in clean sheets.  During a sponge bath Follow these steps when using CHG solution during a sponge bath (unless your health care provider gives you different instructions): Use your normal soap and shampoo to wash your face and hair. Pour the CHG onto a clean washcloth. Starting at your neck, lather your body down to your toes. Make sure you follow these instructions: If you will be having surgery, pay special attention to the part of your body where you will be having surgery. Scrub this area for at least 1 minute. Do not use CHG on your head or face. If the solution gets into your ears or eyes, rinse them well with water. Avoid your genital area. Avoid any areas of skin that have broken skin, cuts, or scrapes. Scrub your back and under your arms. Make sure to wash skin folds. Let the lather sit on your skin for 1-2 minutes or as long as told by your health care provider. Using a different clean, wet washcloth, thoroughly rinse your entire body. Make sure that all body creases and crevices are rinsed well. Dry off with a clean towel. Do not put any substances on your body afterward--such as powder, lotion, or perfume--unless you are told  to do so by your health care provider. Only use lotions that are recommended by the manufacturer. Put on clean clothes or pajamas. If it is  the night before your surgery, sleep in clean sheets. How to use CHG prepackaged cloths Only use CHG cloths as told by your health care provider, and follow the instructions on the label. Use the CHG cloth on clean, dry skin. Do not use the CHG cloth on your head or face unless your health care provider tells you to. When washing with the CHG cloth: Avoid your genital area. Avoid any areas of skin that have broken skin, cuts, or scrapes. Before surgery Follow these steps when using a CHG cloth to clean before surgery (unless your health care provider gives you different instructions): Using the CHG cloth, vigorously scrub the part of your body where you will be having surgery. Scrub using a back-and-forth motion for 3 minutes. The area on your body should be completely wet with CHG when you are done scrubbing. Do not rinse. Discard the cloth and let the area air-dry. Do not put any substances on the area afterward, such as powder, lotion, or perfume. Put on clean clothes or pajamas. If it is the night before your surgery, sleep in clean sheets.  For general bathing Follow these steps when using CHG cloths for general bathing (unless your health care provider gives you different instructions). Use a separate CHG cloth for each area of your body. Make sure you wash between any folds of skin and between your fingers and toes. Wash your body in the following order, switching to a new cloth after each step: The front of your neck, shoulders, and chest. Both of your arms, under your arms, and your hands. Your stomach and groin area, avoiding the genitals. Your right leg and foot. Your left leg and foot. The back of your neck, your back, and your buttocks. Do not rinse. Discard the cloth and let the area air-dry. Do not put any substances on your body afterward--such as powder, lotion, or perfume--unless you are told to do so by your health care provider. Only use lotions that are recommended by the  manufacturer. Put on clean clothes or pajamas. Contact a health care provider if: Your skin gets irritated after scrubbing. You have questions about using your solution or cloth. You swallow any chlorhexidine. Call your local poison control center (1-731-375-8765 in the [place] Get help right away if: Your eyes itch badly, or they become very red or swollen. Your skin itches badly and is red or swollen. Your hearing changes. You have trouble seeing. You have swelling or tingling in your mouth or throat. You have trouble breathing. These symptoms may represent a serious problem that is an emergency. Do not wait to see if the symptoms will go away. Get medical help right away. Call your local emergency services (911 in the [place] Do not drive yourself to the hospital. Summary Chlorhexidine gluconate (CHG) is a germ-killing (antiseptic) solution that is used to clean the skin. Cleaning your skin with CHG may help to lower your risk for infection. You may be given CHG to use for bathing. It may be in a bottle or in a prepackaged cloth to use on your skin. Carefully follow your health care provider's instructions and the instructions on the product label. Do not use CHG if you have a chlorhexidine allergy. Contact your health care provider if your skin gets irritated after scrubbing. This information is not intended to replace  advice given to you by your health care provider. Make sure you discuss any questions you have with your health care provider. Document Revised: 10/07/2020 Document Reviewed: 10/07/2020 Elsevier Patient Education  2022 [employer].

## 2021-06-11 ENCOUNTER — Other Ambulatory Visit: Payer: Self-pay

## 2021-06-11 ENCOUNTER — Encounter (HOSPITAL_COMMUNITY)
Admission: RE | Admit: 2021-06-11 | Discharge: 2021-06-11 | Disposition: A | Discharge status: Home / Self Care | Payer: Medicaid Other | Source: Ambulatory Visit | Attending: Urology | Admitting: Urology

## 2021-06-12 ENCOUNTER — Telehealth: Payer: Self-pay

## 2021-06-12 NOTE — Telephone Encounter (Signed)
Returned call to facility. No answer. Phone rang and no one picked up.    Hoyle Sauer, for now can you pull patient into depot?

## 2021-06-12 NOTE — Telephone Encounter (Signed)
Spoke with Teacher, early years/pre at [facility].  Patient is sick and surgery date postponed to 06/30/2021

## 2021-06-12 NOTE — Telephone Encounter (Signed)
Belinda called from [facility]  regarding his surgery pt needs to r/s surgery that he has for Monday , she would like a call back at [phone] and ask for Christene Slates.

## 2021-06-13 NOTE — Pre-Procedure Instructions (Signed)
Left message for Gibson Ramp about inability to reach POA.

## 2021-06-13 NOTE — Pre-Procedure Instructions (Signed)
We have made multiple attempts and left multiple messages for Clinton [name], sister and POA to find out if she is coming with patient for surgery to sign consent. We have also let her know if she is not coming, we can do a verbal consent over the phone, but have not heard back form her.

## 2021-06-25 ENCOUNTER — Encounter (HOSPITAL_COMMUNITY)
Admission: RE | Admit: 2021-06-25 | Discharge: 2021-06-25 | Disposition: A | Discharge status: Home / Self Care | Payer: Medicaid Other | Source: Ambulatory Visit | Attending: Urology | Admitting: Urology

## 2021-06-25 ENCOUNTER — Other Ambulatory Visit: Payer: Self-pay

## 2021-06-25 ENCOUNTER — Encounter (HOSPITAL_COMMUNITY): Payer: Self-pay

## 2021-06-25 DIAGNOSIS — E119 Type 2 diabetes mellitus without complications: Secondary | ICD-10-CM

## 2021-06-25 HISTORY — DX: Essential (primary) hypertension: I10

## 2021-06-25 HISTORY — DX: Type 2 diabetes mellitus without complications: E11.9

## 2021-06-26 NOTE — Patient Instructions (Signed)
Your procedure is scheduled on: 11/212022  Report to [facility] at    9:00 AM.  Call this number if you have problems the morning of surgery: [phone]   Remember:   Do not Eat or Drink after midnight         No Smoking the morning of surgery  :  Take these medicines the morning of surgery with A SIP OF WATER: Zoloft, Cogentin, propranolol, protonix, zypreka, ativan if needed   Do not wear jewelry, make-up or nail polish.  Do not wear lotions, powders, or perfumes. You may wear deodorant.  Do not shave 48 hours prior to surgery. Men may shave face and neck.  Do not bring valuables to the hospital.  Contacts, dentures or bridgework may not be worn into surgery.  Leave suitcase in the car. After surgery it may be brought to your room.  For patients admitted to the hospital, checkout time is 11:00 AM the day of discharge.   Patients discharged the day of surgery will not be allowed to drive home.    Special Instructions: Shower using CHG night before surgery and shower the day of surgery use CHG.  Use special wash - you have one bottle of CHG for all showers.  You should use approximately 1/2 of the bottle for each shower.  How to Use Chlorhexidine for Bathing Chlorhexidine gluconate (CHG) is a germ-killing (antiseptic) solution that is used to clean the skin. It can get rid of the bacteria that normally live on the skin and can keep them away for about 24 hours. To clean your skin with CHG, you may be given: A CHG solution to use in the shower or as part of a sponge bath. A prepackaged cloth that contains CHG. Cleaning your skin with CHG may help lower the risk for infection: While you are staying in the intensive care unit of the hospital. If you have a vascular access, such as a central line, to provide short-term or long-term access to your veins. If you have a catheter to drain urine from your bladder. If you are on a ventilator. A ventilator is a machine that  helps you breathe by moving air in and out of your lungs. After surgery. What are the risks? Risks of using CHG include: A skin reaction. Hearing loss, if CHG gets in your ears and you have a perforated eardrum. Eye injury, if CHG gets in your eyes and is not rinsed out. The CHG product catching fire. Make sure that you avoid smoking and flames after applying CHG to your skin. Do not use CHG: If you have a chlorhexidine allergy or have previously reacted to chlorhexidine. On babies younger than 55 months of age. How to use CHG solution Use CHG only as told by your health care provider, and follow the instructions on the label. Use the full amount of CHG as directed. Usually, this is one bottle. During a shower Follow these steps when using CHG solution during a shower (unless your health care provider gives you different instructions): Start the shower. Use your normal soap and shampoo to wash your face and hair. Turn off the shower or move out of the shower stream. Pour the CHG onto a clean washcloth. Do not use any type of brush or rough-edged sponge. Starting at your neck, lather your body down to your toes. Make sure you follow these instructions: If you will be having surgery, pay special attention to the part of your body  where you will be having surgery. Scrub this area for at least 1 minute. Do not use CHG on your head or face. If the solution gets into your ears or eyes, rinse them well with water. Avoid your genital area. Avoid any areas of skin that have broken skin, cuts, or scrapes. Scrub your back and under your arms. Make sure to wash skin folds. Let the lather sit on your skin for 1-2 minutes or as long as told by your health care provider. Thoroughly rinse your entire body in the shower. Make sure that all body creases and crevices are rinsed well. Dry off with a clean towel. Do not put any substances on your body afterward--such as powder, lotion, or perfume--unless you  are told to do so by your health care provider. Only use lotions that are recommended by the manufacturer. Put on clean clothes or pajamas. If it is the night before your surgery, sleep in clean sheets.  During a sponge bath Follow these steps when using CHG solution during a sponge bath (unless your health care provider gives you different instructions): Use your normal soap and shampoo to wash your face and hair. Pour the CHG onto a clean washcloth. Starting at your neck, lather your body down to your toes. Make sure you follow these instructions: If you will be having surgery, pay special attention to the part of your body where you will be having surgery. Scrub this area for at least 1 minute. Do not use CHG on your head or face. If the solution gets into your ears or eyes, rinse them well with water. Avoid your genital area. Avoid any areas of skin that have broken skin, cuts, or scrapes. Scrub your back and under your arms. Make sure to wash skin folds. Let the lather sit on your skin for 1-2 minutes or as long as told by your health care provider. Using a different clean, wet washcloth, thoroughly rinse your entire body. Make sure that all body creases and crevices are rinsed well. Dry off with a clean towel. Do not put any substances on your body afterward--such as powder, lotion, or perfume--unless you are told to do so by your health care provider. Only use lotions that are recommended by the manufacturer. Put on clean clothes or pajamas. If it is the night before your surgery, sleep in clean sheets. How to use CHG prepackaged cloths Only use CHG cloths as told by your health care provider, and follow the instructions on the label. Use the CHG cloth on clean, dry skin. Do not use the CHG cloth on your head or face unless your health care provider tells you to. When washing with the CHG cloth: Avoid your genital area. Avoid any areas of skin that have broken skin, cuts, or  scrapes. Before surgery Follow these steps when using a CHG cloth to clean before surgery (unless your health care provider gives you different instructions): Using the CHG cloth, vigorously scrub the part of your body where you will be having surgery. Scrub using a back-and-forth motion for 3 minutes. The area on your body should be completely wet with CHG when you are done scrubbing. Do not rinse. Discard the cloth and let the area air-dry. Do not put any substances on the area afterward, such as powder, lotion, or perfume. Put on clean clothes or pajamas. If it is the night before your surgery, sleep in clean sheets.  For general bathing Follow these steps when using CHG cloths for  general bathing (unless your health care provider gives you different instructions). Use a separate CHG cloth for each area of your body. Make sure you wash between any folds of skin and between your fingers and toes. Wash your body in the following order, switching to a new cloth after each step: The front of your neck, shoulders, and chest. Both of your arms, under your arms, and your hands. Your stomach and groin area, avoiding the genitals. Your right leg and foot. Your left leg and foot. The back of your neck, your back, and your buttocks. Do not rinse. Discard the cloth and let the area air-dry. Do not put any substances on your body afterward--such as powder, lotion, or perfume--unless you are told to do so by your health care provider. Only use lotions that are recommended by the manufacturer. Put on clean clothes or pajamas. Contact a health care provider if: Your skin gets irritated after scrubbing. You have questions about using your solution or cloth. You swallow any chlorhexidine. Call your local poison control center (1-(907)638-4786 in the [place] Get help right away if: Your eyes itch badly, or they become very red or swollen. Your skin itches badly and is red or swollen. Your hearing  changes. You have trouble seeing. You have swelling or tingling in your mouth or throat. You have trouble breathing. These symptoms may represent a serious problem that is an emergency. Do not wait to see if the symptoms will go away. Get medical help right away. Call your local emergency services (911 in the [place] Do not drive yourself to the hospital. Summary Chlorhexidine gluconate (CHG) is a germ-killing (antiseptic) solution that is used to clean the skin. Cleaning your skin with CHG may help to lower your risk for infection. You may be given CHG to use for bathing. It may be in a bottle or in a prepackaged cloth to use on your skin. Carefully follow your health care provider's instructions and the instructions on the product label. Do not use CHG if you have a chlorhexidine allergy. Contact your health care provider if your skin gets irritated after scrubbing. This information is not intended to replace advice given to you by your health care provider. Make sure you discuss any questions you have with your health care provider. Document Revised: 10/07/2020 Document Reviewed: 10/07/2020 Elsevier Patient Education  2022 [place]. Cystoscopy Cystoscopy is a procedure that is used to help diagnose and sometimes treat conditions that affect the lower urinary tract. The lower urinary tract includes the bladder and the urethra. The urethra is the tube that drains urine from the bladder. Cystoscopy is done using a thin, tube-shaped instrument with a light and camera at the end (cystoscope). The cystoscope may be hard or flexible, depending on the goal of the procedure. The cystoscope is inserted through the urethra, into the bladder. Cystoscopy may be recommended if you have: Urinary tract infections that keep coming back. Blood in the urine (hematuria). An inability to control when you urinate (urinary incontinence) or an overactive bladder. Unusual cells found in a urine sample. A blockage  in the urethra, such as a urinary stone. Painful urination. An abnormality in the bladder found during an intravenous pyelogram (IVP) or CT scan. Cystoscopy may also be done to remove a sample of tissue to be examined under a microscope (biopsy). Tell a health care provider about: Any allergies you have. All medicines you are taking, including vitamins, herbs, eye drops, creams, and over-the-counter medicines. Any problems you  or family members have had with anesthetic medicines. Any blood disorders you have. Any surgeries you have had. Any medical conditions you have. Whether you are pregnant or may be pregnant. What are the risks? Generally, this is a safe procedure. However, problems may occur, including: Infection. Bleeding. Allergic reactions to medicines. Damage to other structures or organs. What happens before the procedure? Medicines Ask your health care provider about: Changing or stopping your regular medicines. This is especially important if you are taking diabetes medicines or blood thinners. Taking medicines such as aspirin and ibuprofen. These medicines can thin your blood. Do not take these medicines unless your health care provider tells you to take them. Taking over-the-counter medicines, vitamins, herbs, and supplements. Tests You may have an exam or testing, such as: X-rays of the bladder, urethra, or kidneys. CT scan of the abdomen or pelvis. Urine tests to check for signs of infection. General instructions Follow instructions from your health care provider about eating or drinking restrictions. Ask your health care provider what steps will be taken to help prevent infection. These steps may include: Washing skin with a germ-killing soap. Taking antibiotic medicine. Plan to have a responsible adult take you home from the hospital or clinic. What happens during the procedure?  You will be given one or more of the following: A medicine to help you relax  (sedative). A medicine to numb the area (local anesthetic). The area around the opening of your urethra will be cleaned. The cystoscope will be passed through your urethra into your bladder. Germ-free (sterile) fluid will flow through the cystoscope to fill your bladder. The fluid will stretch your bladder so that your health care provider can clearly examine your bladder walls. Your doctor will look at the urethra and bladder. Your doctor may take a biopsy or remove stones. The cystoscope will be removed, and your bladder will be emptied. The procedure may vary among health care providers and hospitals. What can I expect after the procedure? After the procedure, it is common to have: Some soreness or pain in your abdomen and urethra. Urinary symptoms. These include: Mild pain or burning when you urinate. Pain should stop within a few minutes after you urinate. This may last for up to 1 week. A small amount of blood in your urine for several days. Feeling like you need to urinate but producing only a small amount of urine. Follow these instructions at home: Medicines Take over-the-counter and prescription medicines only as told by your health care provider. If you were prescribed an antibiotic medicine, take it as told by your health care provider. Do not stop taking the antibiotic even if you start to feel better. General instructions Return to your normal activities as told by your health care provider. Ask your health care provider what activities are safe for you. If you were given a sedative during the procedure, it can affect you for several hours. Do not drive or operate machinery until your health care provider says that it is safe. Watch for any blood in your urine. If the amount of blood in your urine increases, call your health care provider. Follow instructions from your health care provider about eating or drinking restrictions. If a tissue sample was removed for testing (biopsy)  during your procedure, it is up to you to get your test results. Ask your health care provider, or the department that is doing the test, when your results will be ready. Drink enough fluid to keep your urine pale  yellow. Keep all follow-up visits. This is important. Contact a health care provider if: You have pain that gets worse or does not get better with medicine, especially pain when you urinate. You have trouble urinating. You have more blood in your urine. Get help right away if: You have blood clots in your urine. You have abdominal pain. You have a fever or chills. You are unable to urinate. Summary Cystoscopy is a procedure that is used to help diagnose and sometimes treat conditions that affect the lower urinary tract. Cystoscopy is done using a thin, tube-shaped instrument with a light and camera at the end. After the procedure, it is common to have some soreness or pain in your abdomen and urethra. Watch for any blood in your urine. If the amount of blood in your urine increases, call your health care provider. If you were prescribed an antibiotic medicine, take it as told by your health care provider. Do not stop taking the antibiotic even if you start to feel better. This information is not intended to replace advice given to you by your health care provider. Make sure you discuss any questions you have with your health care provider. Document Revised: 04/09/2021 Document Reviewed: 03/08/2020 Elsevier Patient Education  [place] Anesthesia, Adult, Care After This sheet gives you information about how to care for yourself after your procedure. Your health care provider may also give you more specific instructions. If you have problems or questions, contact your health care provider. What can I expect after the procedure? After the procedure, the following side effects are common: Pain or discomfort at the IV site. Nausea. Vomiting. Sore throat. Trouble  concentrating. Feeling cold or chills. Feeling weak or tired. Sleepiness and fatigue. Soreness and body aches. These side effects can affect parts of the body that were not involved in surgery. Follow these instructions at home: For the time period you were told by your health care provider:  Rest. Do not participate in activities where you could fall or become injured. Do not drive or use machinery. Do not drink alcohol. Do not take sleeping pills or medicines that cause drowsiness. Do not make important decisions or sign legal documents. Do not take care of children on your own. Eating and drinking Follow any instructions from your health care provider about eating or drinking restrictions. When you feel hungry, start by eating small amounts of foods that are soft and easy to digest (bland), such as toast. Gradually return to your regular diet. Drink enough fluid to keep your urine pale yellow. If you vomit, rehydrate by drinking water, juice, or clear broth. General instructions If you have sleep apnea, surgery and certain medicines can increase your risk for breathing problems. Follow instructions from your health care provider about wearing your sleep device: Anytime you are sleeping, including during daytime naps. While taking prescription pain medicines, sleeping medicines, or medicines that make you drowsy. Have a responsible adult stay with you for the time you are told. It is important to have someone help care for you until you are awake and alert. Return to your normal activities as told by your health care provider. Ask your health care provider what activities are safe for you. Take over-the-counter and prescription medicines only as told by your health care provider. If you smoke, do not smoke without supervision. Keep all follow-up visits as told by your health care provider. This is important. Contact a health care provider if: You have nausea or vomiting that  does not  get better with medicine. You cannot eat or drink without vomiting. You have pain that does not get better with medicine. You are unable to pass urine. You develop a skin rash. You have a fever. You have redness around your IV site that gets worse. Get help right away if: You have difficulty breathing. You have chest pain. You have blood in your urine or stool, or you vomit blood. Summary After the procedure, it is common to have a sore throat or nausea. It is also common to feel tired. Have a responsible adult stay with you for the time you are told. It is important to have someone help care for you until you are awake and alert. When you feel hungry, start by eating small amounts of foods that are soft and easy to digest (bland), such as toast. Gradually return to your regular diet. Drink enough fluid to keep your urine pale yellow. Return to your normal activities as told by your health care provider. Ask your health care provider what activities are safe for you. This information is not intended to replace advice given to you by your health care provider. Make sure you discuss any questions you have with your health care provider. Document Revised: 04/11/2020 Document Reviewed: 11/09/2019 Elsevier Patient Education  2022 [employer].

## 2021-06-27 ENCOUNTER — Other Ambulatory Visit: Payer: Self-pay

## 2021-06-27 ENCOUNTER — Encounter (HOSPITAL_COMMUNITY)
Admission: RE | Admit: 2021-06-27 | Discharge: 2021-06-27 | Disposition: A | Discharge status: Home / Self Care | Payer: Medicaid Other | Source: Ambulatory Visit | Attending: Urology | Admitting: Urology

## 2021-06-27 NOTE — Progress Notes (Signed)
Spoke with Braxton Feathers at [place] after midnight, to arrive at 8:30 am 06/30/2021

## 2021-06-30 ENCOUNTER — Ambulatory Visit (HOSPITAL_COMMUNITY): Payer: Medicaid Other | Admitting: Anesthesiology

## 2021-06-30 ENCOUNTER — Ambulatory Visit (HOSPITAL_COMMUNITY)
Admission: RE | Admit: 2021-06-30 | Discharge: 2021-06-30 | Disposition: A | Discharge status: Skilled Nursing Facility | Payer: Medicaid Other | Source: Ambulatory Visit | Attending: Urology | Admitting: Urology

## 2021-06-30 ENCOUNTER — Encounter (HOSPITAL_COMMUNITY)
Admission: RE | Disposition: A | Discharge status: Skilled Nursing Facility | Payer: Self-pay | Source: Ambulatory Visit | Attending: Urology

## 2021-06-30 DIAGNOSIS — F419 Anxiety disorder, unspecified: Secondary | ICD-10-CM | POA: Diagnosis not present

## 2021-06-30 DIAGNOSIS — N35919 Unspecified urethral stricture, male, unspecified site: Secondary | ICD-10-CM

## 2021-06-30 DIAGNOSIS — N210 Calculus in bladder: Secondary | ICD-10-CM

## 2021-06-30 DIAGNOSIS — F1290 Cannabis use, unspecified, uncomplicated: Secondary | ICD-10-CM | POA: Insufficient documentation

## 2021-06-30 DIAGNOSIS — E119 Type 2 diabetes mellitus without complications: Secondary | ICD-10-CM | POA: Insufficient documentation

## 2021-06-30 DIAGNOSIS — Z87891 Personal history of nicotine dependence: Secondary | ICD-10-CM | POA: Insufficient documentation

## 2021-06-30 DIAGNOSIS — F909 Attention-deficit hyperactivity disorder, unspecified type: Secondary | ICD-10-CM | POA: Insufficient documentation

## 2021-06-30 DIAGNOSIS — R569 Unspecified convulsions: Secondary | ICD-10-CM | POA: Insufficient documentation

## 2021-06-30 DIAGNOSIS — D649 Anemia, unspecified: Secondary | ICD-10-CM | POA: Insufficient documentation

## 2021-06-30 DIAGNOSIS — R519 Headache, unspecified: Secondary | ICD-10-CM | POA: Insufficient documentation

## 2021-06-30 DIAGNOSIS — Z87820 Personal history of traumatic brain injury: Secondary | ICD-10-CM | POA: Diagnosis not present

## 2021-06-30 DIAGNOSIS — F319 Bipolar disorder, unspecified: Secondary | ICD-10-CM | POA: Diagnosis not present

## 2021-06-30 DIAGNOSIS — Z7984 Long term (current) use of oral hypoglycemic drugs: Secondary | ICD-10-CM | POA: Insufficient documentation

## 2021-06-30 DIAGNOSIS — N319 Neuromuscular dysfunction of bladder, unspecified: Secondary | ICD-10-CM | POA: Diagnosis not present

## 2021-06-30 DIAGNOSIS — I10 Essential (primary) hypertension: Secondary | ICD-10-CM | POA: Insufficient documentation

## 2021-06-30 DIAGNOSIS — R39198 Other difficulties with micturition: Secondary | ICD-10-CM | POA: Diagnosis present

## 2021-06-30 HISTORY — PX: CYSTOSCOPY WITH LITHOLAPAXY: SHX1425

## 2021-06-30 HISTORY — PX: CYSTOSCOPY WITH URETHRAL DILATATION: SHX5125

## 2021-06-30 LAB — BASIC METABOLIC PANEL
Anion gap: 5 (ref 5–15)
BUN: 15 mg/dL (ref 6–20)
CO2: 29 mmol/L (ref 22–32)
Calcium: 9.3 mg/dL (ref 8.9–10.3)
Chloride: 105 mmol/L (ref 98–111)
Creatinine, Ser: 0.68 mg/dL (ref 0.61–1.24)
GFR, Estimated: >60 mL/min (ref >60)
Glucose, Bld: 86 mg/dL (ref 70–99)
Potassium: 4 mmol/L (ref 3.5–5.1)
Sodium: 139 mmol/L (ref 135–145)

## 2021-06-30 SURGERY — CYSTOSCOPY, WITH URETHRAL DILATION
Anesthesia: General | Site: Ureter

## 2021-06-30 MED ORDER — FENTANYL CITRATE PF 50 MCG/ML IJ SOSY
25.0000 ug | PREFILLED_SYRINGE | INTRAMUSCULAR | Status: DC | PRN
  Administered 2021-06-30: 50 ug via INTRAVENOUS
  Filled 2021-06-30: qty 1

## 2021-06-30 MED ORDER — ONDANSETRON HCL 4 MG/2ML IJ SOLN
INTRAMUSCULAR | Status: AC
  Filled 2021-06-30: qty 6

## 2021-06-30 MED ORDER — ROCURONIUM BROMIDE 100 MG/10ML IV SOLN
INTRAVENOUS | Status: DC | PRN
  Administered 2021-06-30: 40 mg via INTRAVENOUS

## 2021-06-30 MED ORDER — SUGAMMADEX SODIUM 200 MG/2ML IV SOLN
INTRAVENOUS | Status: DC | PRN
  Administered 2021-06-30: 200 mg via INTRAVENOUS

## 2021-06-30 MED ORDER — WATER FOR IRRIGATION, STERILE IR SOLN
Status: DC | PRN
  Administered 2021-06-30: 500 mL

## 2021-06-30 MED ORDER — CEFAZOLIN SODIUM-DEXTROSE 2-4 GM/100ML-% IV SOLN
2.0000 g | INTRAVENOUS | Status: AC
  Administered 2021-06-30: 2 g via INTRAVENOUS
  Filled 2021-06-30: qty 100

## 2021-06-30 MED ORDER — MIDAZOLAM HCL 5 MG/5ML IJ SOLN
INTRAMUSCULAR | Status: DC | PRN
  Administered 2021-06-30: 2 mg via INTRAVENOUS

## 2021-06-30 MED ORDER — ONDANSETRON HCL 4 MG/2ML IJ SOLN: 4.0000 mg | Freq: Once | INTRAMUSCULAR | Status: DC | PRN

## 2021-06-30 MED ORDER — LIDOCAINE HCL (PF) 2 % IJ SOLN
INTRAMUSCULAR | Status: AC
  Filled 2021-06-30: qty 5

## 2021-06-30 MED ORDER — SODIUM CHLORIDE 0.9 % IR SOLN
Status: DC | PRN
  Administered 2021-06-30 (×2): 3000 mL

## 2021-06-30 MED ORDER — HYDROMORPHONE HCL 1 MG/ML IJ SOLN
0.5000 mg | INTRAMUSCULAR | Status: DC | PRN
  Administered 2021-06-30: 0.5 mg via INTRAVENOUS
  Filled 2021-06-30: qty 0.5

## 2021-06-30 MED ORDER — ORAL CARE MOUTH RINSE: 15.0000 mL | Freq: Once | OROMUCOSAL | Status: DC

## 2021-06-30 MED ORDER — MIDAZOLAM HCL 2 MG/2ML IJ SOLN
INTRAMUSCULAR | Status: AC
  Filled 2021-06-30: qty 2

## 2021-06-30 MED ORDER — CHLORHEXIDINE GLUCONATE 0.12 % MT SOLN: 15.0000 mL | Freq: Once | OROMUCOSAL | Status: DC

## 2021-06-30 MED ORDER — FENTANYL CITRATE (PF) 100 MCG/2ML IJ SOLN
INTRAMUSCULAR | Status: DC | PRN
  Administered 2021-06-30: 100 ug via INTRAVENOUS
  Administered 2021-06-30: 50 ug via INTRAVENOUS

## 2021-06-30 MED ORDER — PROPOFOL 10 MG/ML IV BOLUS
INTRAVENOUS | Status: AC
  Filled 2021-06-30: qty 20

## 2021-06-30 MED ORDER — LACTATED RINGERS IV SOLN: INTRAVENOUS | Status: DC

## 2021-06-30 MED ORDER — LIDOCAINE HCL (CARDIAC) PF 100 MG/5ML IV SOSY
PREFILLED_SYRINGE | INTRAVENOUS | Status: DC | PRN
  Administered 2021-06-30: 60 mg via INTRAVENOUS

## 2021-06-30 MED ORDER — TRAMADOL HCL 50 MG PO TABS: 50.0000 mg | ORAL_TABLET | Freq: Four times a day (QID) | ORAL | 0 refills | Status: AC | PRN

## 2021-06-30 MED ORDER — ONDANSETRON HCL 4 MG/2ML IJ SOLN
INTRAMUSCULAR | Status: DC | PRN
  Administered 2021-06-30: 4 mg via INTRAVENOUS

## 2021-06-30 MED ORDER — ROCURONIUM BROMIDE 10 MG/ML (PF) SYRINGE
PREFILLED_SYRINGE | INTRAVENOUS | Status: AC
  Filled 2021-06-30: qty 10

## 2021-06-30 MED ORDER — PROPOFOL 10 MG/ML IV BOLUS
INTRAVENOUS | Status: DC | PRN
  Administered 2021-06-30: 120 mg via INTRAVENOUS

## 2021-06-30 MED ORDER — FENTANYL CITRATE (PF) 250 MCG/5ML IJ SOLN
INTRAMUSCULAR | Status: AC
  Filled 2021-06-30: qty 5

## 2021-06-30 SURGICAL SUPPLY — 20 items
BAG DRAIN URO TABLE W/ADPT NS (BAG) ×3 IMPLANT
BAG DRN 8 ADPR NS SKTRN CSTL (BAG) ×2
BAG DRN RND TRDRP ANRFLXCHMBR (UROLOGICAL SUPPLIES) ×2
BAG HAMPER (MISCELLANEOUS) ×3 IMPLANT
BAG URINE DRAIN 2000ML AR STRL (UROLOGICAL SUPPLIES) ×3 IMPLANT
CATH FOLEY 2W 5CC 18FR LF (CATHETERS) ×2 IMPLANT
CATH INTERMIT  6FR 70CM (CATHETERS) ×2 IMPLANT
CLOTH BEACON ORANGE TIMEOUT ST (SAFETY) ×3 IMPLANT
GLOVE SURG POLYISO LF SZ8 (GLOVE) ×3 IMPLANT
GLOVE SURG UNDER POLY LF SZ7 (GLOVE) ×8 IMPLANT
GOWN STRL REUS W/TWL LRG LVL3 (GOWN DISPOSABLE) ×5 IMPLANT
GOWN STRL REUS W/TWL XL LVL3 (GOWN DISPOSABLE) ×3 IMPLANT
GUIDEWIRE STR DUAL SENSOR (WIRE) ×3 IMPLANT
IV NS IRRIG 3000ML ARTHROMATIC (IV SOLUTION) ×5 IMPLANT
KIT TURNOVER CYSTO (KITS) ×3 IMPLANT
MANIFOLD NEPTUNE II (INSTRUMENTS) ×3 IMPLANT
PACK CYSTO (CUSTOM PROCEDURE TRAY) ×3 IMPLANT
PAD ARMBOARD 7.5X6 YLW CONV (MISCELLANEOUS) ×3 IMPLANT
TOWEL NATURAL 4PK STERILE (DISPOSABLE) ×3 IMPLANT
WATER STERILE IRR 500ML POUR (IV SOLUTION) ×3 IMPLANT

## 2021-06-30 NOTE — Anesthesia Preprocedure Evaluation (Signed)
Anesthesia Evaluation  Patient identified by MRN, date of birth, ID band Patient awake    Reviewed: Allergy & Precautions, NPO status , Patient's Chart, lab work & pertinent test results, reviewed documented beta blocker date and time   History of Anesthesia Complications (+) DIFFICULT AIRWAY and history of anesthetic complications  Airway Mallampati: III  TM Distance: >3 FB Neck ROM: Full   Comment: Tracheostomy scar Dental  (+) Dental Advisory Given, Missing, Poor Dentition   Pulmonary pneumonia, former smoker,  Tracheostomy   Pulmonary exam normal breath sounds clear to auscultation       Cardiovascular Exercise Tolerance: Good hypertension, Pt. on home beta blockers and Pt. on medications Normal cardiovascular exam Rhythm:Regular Rate:Normal     Neuro/Psych  Headaches, Seizures -, Well Controlled,  PSYCHIATRIC DISORDERS Anxiety Bipolar Disorder CVA (craniotomy, TBI)    GI/Hepatic negative GI ROS, (+)     substance abuse  alcohol use and marijuana use,   Endo/Other  diabetes, Well Controlled, Type 2, Oral Hypoglycemic Agents  Renal/GU negative Renal ROS     Musculoskeletal   Abdominal   Peds  (+) ADHD Hematology  (+) anemia ,   Anesthesia Other Findings   Reproductive/Obstetrics                           Anesthesia Physical Anesthesia Plan  ASA: 4  Anesthesia Plan: General   Post-op Pain Management:    Induction:   PONV Risk Score and Plan: 4 or greater and Ondansetron and Dexamethasone  Airway Management Planned: Oral ETT  Additional Equipment:   Intra-op Plan:   Post-operative Plan: Extubation in OR  Informed Consent: I have reviewed the patients History and Physical, chart, labs and discussed the procedure including the risks, benefits and alternatives for the proposed anesthesia with the patient or authorized representative who has indicated his/her understanding and  acceptance.     Dental advisory given  Plan Discussed with: CRNA and Surgeon  Anesthesia Plan Comments:         Anesthesia Quick Evaluation

## 2021-06-30 NOTE — Progress Notes (Signed)
Ashley-sister POA, at bedside. Consent signed.

## 2021-06-30 NOTE — Anesthesia Postprocedure Evaluation (Signed)
Anesthesia Post Note  Patient: Clinton [name]  Procedure(s) Performed: CYSTOSCOPY WITH URETHRAL DILATATION ([facility]) CYSTOSCOPY WITH LITHOLAPAXY ([facility])  Patient location during evaluation: PACU Anesthesia Type: General Level of consciousness: awake and alert Pain management: pain level controlled Vital Signs Assessment: post-procedure vital signs reviewed and stable Respiratory status: spontaneous breathing, nonlabored ventilation and respiratory function stable Cardiovascular status: blood pressure returned to baseline and stable Postop Assessment: no apparent nausea or vomiting Anesthetic complications: no   No notable events documented.   Last Vitals:  Vitals:   06/30/21 1245 06/30/21 1300  BP: 117/80 111/75  Pulse: 78 63  Resp: (!) 26 11  Temp: 36.4 C   SpO2: 98% 96%    Last Pain:  Vitals:   06/30/21 1243  PainSc: 4                  Dandrae Kustra C Camdynn Maranto

## 2021-06-30 NOTE — Anesthesia Procedure Notes (Signed)
Procedure Name: Intubation Date/Time: 06/30/2021 10:46 AM Performed by: Jonna Munro, CRNA Pre-anesthesia Checklist: Patient identified, Emergency Drugs available, Suction available, Patient being monitored and Timeout performed Patient Re-evaluated:Patient Re-evaluated prior to induction Oxygen Delivery Method: Circle system utilized Preoxygenation: Pre-oxygenation with 100% oxygen Induction Type: IV induction Ventilation: Mask ventilation without difficulty Laryngoscope Size: Glidescope and 4 Grade View: Grade I Tube type: Oral Tube size: 7.0 mm Number of attempts: 1 Airway Equipment and Method: Stylet and Video-laryngoscopy Placement Confirmation: ETT inserted through vocal cords under direct vision, positive ETCO2 and breath sounds checked- equal and bilateral Secured at: 22 cm Tube secured with: Tape Dental Injury: Teeth and Oropharynx as per pre-operative assessment
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
## 2021-06-30 NOTE — Op Note (Signed)
Preoperative diagnosis:  bladder calculi  Postoperative diagnosis: penile urethral strictures, bladder calculi  Procedure: 1 cystoscopy 2. Urethral dilation 3. cystolithalopaxy for a stone less than 2.5cm  Attending: Nicolette Bang  Anesthesia: General  Estimated blood loss: Minimal  Drains: 18 french foley  Specimens: 1. Bladder calculus  Antibiotics: ancef  Findings:  Pan urethral stricture disease. Urethral dilated from 12 french to 22 french. Ureteral orifices in normal anatomic location.  Numerous 2-58mm bladder calculi  Indications: Patient is a 25 year old male with a history of neurogenic bladder and bladder calculus.  After discussing treatment options, they decided proceed with cystolithalopaxy.  Procedure in detail: The patient was brought to the operating room and a brief timeout was done to ensure correct patient, correct procedure, correct site.  General anesthesia was administered patient was placed in dorsal lithotomy position.  Their genitalia was then prepped and draped in usual sterile fashion.  A rigid 82 French cystoscope was passed in the urethra and we noted numerous urethral strictures. A sensor wire was then passed into the bladder. We then used sequential dilators to dilate the urethra from 12 french to 22 french. We then passed the cystoscope into the bladder.  Bladder was inspected and we noted no masses or lesions.  the ureteral orifices were in the normal orthotopic locations. We noted numerous small bladder calculi which were irrigated from the bladder. and the fragments were then irrigated from the bladder. The stone fragments were the sent for analysis. An 39 french silicone catheter was placed, the bladder was then drained and this concluded the procedure which was well tolerated by patient.  Complications: None  Condition: Stable, extubated, transferred to PACU  Plan: Patient is to be discharge home. He is to followup in 1 week for a voiding trial.

## 2021-06-30 NOTE — Progress Notes (Signed)
Dr Charna Elizabeth d/c'd EKG. No EKG needed.

## 2021-06-30 NOTE — Transfer of Care (Signed)
Immediate Anesthesia Transfer of Care Note  Patient: Clinton [name]  Procedure(s) Performed: CYSTOSCOPY WITH URETHRAL DILATATION ([facility]) CYSTOSCOPY WITH LITHOLAPAXY ([facility])  Patient Location: PACU  Anesthesia Type:General  Level of Consciousness: sedated  Airway & Oxygen Therapy: Patient Spontanous Breathing  Post-op Assessment: Report given to RN  Post vital signs: Reviewed and stable  Last Vitals:  Vitals Value Taken Time  BP 98/56 06/30/21 1145  Temp    Pulse 76 06/30/21 1148  Resp 14 06/30/21 1148  SpO2 100 % 06/30/21 1148  Vitals shown include unvalidated device data.  Last Pain:  Vitals:   06/30/21 1000  PainSc: 0-No pain         Complications: No notable events documented.

## 2021-07-01 ENCOUNTER — Encounter (HOSPITAL_COMMUNITY): Payer: Self-pay | Admitting: Urology

## 2021-07-07 LAB — CALCULI, WITH PHOTOGRAPH (CLINICAL LAB)
Calcium Oxalate Monohydrate: 10 %
Hydroxyapatite: 90 %
Weight Calculi: 240 mg

## 2021-07-10 ENCOUNTER — Encounter: Payer: Self-pay | Admitting: Urology

## 2021-07-10 ENCOUNTER — Ambulatory Visit (INDEPENDENT_AMBULATORY_CARE_PROVIDER_SITE_OTHER): Payer: Medicaid Other | Admitting: Urology

## 2021-07-10 ENCOUNTER — Other Ambulatory Visit: Payer: Self-pay

## 2021-07-10 VITALS — BP 131/78 | HR 86

## 2021-07-10 DIAGNOSIS — N210 Calculus in bladder: Secondary | ICD-10-CM

## 2021-07-10 DIAGNOSIS — N35016 Post-traumatic urethral stricture, male, overlapping sites: Secondary | ICD-10-CM | POA: Diagnosis not present

## 2021-07-10 DIAGNOSIS — R339 Retention of urine, unspecified: Secondary | ICD-10-CM

## 2021-07-10 NOTE — Progress Notes (Signed)
  Post op visit
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
## 2021-10-09 ENCOUNTER — Other Ambulatory Visit: Payer: Self-pay

## 2021-10-09 ENCOUNTER — Encounter: Payer: Self-pay | Admitting: Urology

## 2021-10-09 ENCOUNTER — Ambulatory Visit (INDEPENDENT_AMBULATORY_CARE_PROVIDER_SITE_OTHER): Payer: Medicaid Other | Admitting: Urology

## 2021-10-09 VITALS — BP 111/75 | HR 89

## 2021-10-09 DIAGNOSIS — R339 Retention of urine, unspecified: Secondary | ICD-10-CM | POA: Diagnosis not present

## 2021-10-09 DIAGNOSIS — Z87440 Personal history of urinary (tract) infections: Secondary | ICD-10-CM
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
## 2021-11-18 ENCOUNTER — Ambulatory Visit (INDEPENDENT_AMBULATORY_CARE_PROVIDER_SITE_OTHER): Payer: Medicaid Other | Admitting: Physician Assistant

## 2021-11-18 VITALS — BP 87/64 | HR 78

## 2021-11-18 DIAGNOSIS — R300 Dysuria: Secondary | ICD-10-CM | POA: Diagnosis not present

## 2021-11-18 DIAGNOSIS — Z978 Presence of other specified devices: Secondary | ICD-10-CM | POA: Diagnosis not present
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
## 2022-01-28 ENCOUNTER — Telehealth: Payer: Self-pay

## 2022-01-28 NOTE — Telephone Encounter (Signed)
Facility nurse called stating patient does not want catheter and is pulling the catheter trying to pull it out. Nurse ask if its ok to do a voiding trial at facility.  Please advise.

## 2022-04-01 ENCOUNTER — Telehealth: Payer: Self-pay

## 2022-04-01 NOTE — Telephone Encounter (Signed)
See below. Please advise.  

## 2022-04-01 NOTE — Telephone Encounter (Signed)
Doreene Nest [phone] nurse from [facility] wanting a clinical member to call her back. Yesterday patients catheter was leaking. The facility flushed catheter and it continued leaking. The facility called and the on call gave order to remove catheter. When they pulled cath a lot of blood with several clots came out. Kayla advised patient has voided today. They advised if they could do a voiding trial and if the catheter needed to be placed back in they would do so.  Patent frequently complains of penile pain and the facility thought a suprapubic cath might be a good option. The patients sister is his legal guardian and it would need to be discussed with her.

## 2022-04-03 NOTE — Telephone Encounter (Signed)
Unable to reach facility by phone after multiple attempts.  Letter faxed informing nurse of response from [facility].  Apt scheduled with PA on 08/30, apt reminder included in fax to facility and apt date confirmed with facility.

## 2022-04-03 NOTE — Telephone Encounter (Signed)
See below

## 2022-04-07 ENCOUNTER — Ambulatory Visit: Payer: Medicaid Other | Admitting: Physician Assistant

## 2022-04-08 ENCOUNTER — Ambulatory Visit (INDEPENDENT_AMBULATORY_CARE_PROVIDER_SITE_OTHER): Payer: Medicaid Other | Admitting: Physician Assistant

## 2022-04-08 VITALS — BP 105/58 | HR 73

## 2022-04-08 DIAGNOSIS — Z87440 Personal history of urinary (tract) infections: Secondary | ICD-10-CM

## 2022-04-08 DIAGNOSIS — R339 Retention of urine, unspecified: Secondary | ICD-10-CM

## 2022-04-08 DIAGNOSIS — N35016 Post-traumatic urethral stricture, male, overlapping sites: Secondary | ICD-10-CM | POA: Diagnosis not present

## 2022-04-08 LAB — BLADDER SCAN AMB NON-IMAGING: Scan Result: 74

## 2022-04-08 NOTE — Progress Notes (Signed)
post void residual 74
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
## 2022-04-08 NOTE — Telephone Encounter (Signed)
Patient seen in office today. 

## 2022-04-23 ENCOUNTER — Other Ambulatory Visit: Payer: Medicaid Other | Admitting: Urology

## 2022-04-30 ENCOUNTER — Ambulatory Visit (INDEPENDENT_AMBULATORY_CARE_PROVIDER_SITE_OTHER): Payer: Medicaid Other | Admitting: Urology

## 2022-04-30 VITALS — BP 97/68 | HR 77

## 2022-04-30 DIAGNOSIS — N35016 Post-traumatic urethral stricture, male, overlapping sites: Secondary | ICD-10-CM | POA: Diagnosis not present

## 2022-04-30 DIAGNOSIS — N319 Neuromuscular dysfunction of bladder, unspecified: Secondary | ICD-10-CM | POA: Diagnosis not present

## 2022-04-30 DIAGNOSIS — R339 Retention of urine, unspecified: Secondary | ICD-10-CM

## 2022-04-30 DIAGNOSIS — Z87440 Personal history of urinary (tract) infections: Secondary | ICD-10-CM | POA: Diagnosis not present

## 2022-04-30 DIAGNOSIS — N3941 Urge incontinence: Secondary | ICD-10-CM

## 2022-04-30 DIAGNOSIS — N210 Calculus in bladder: Secondary | ICD-10-CM

## 2022-04-30 LAB — BLADDER SCAN AMB NON-IMAGING: Scan Result: 114
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
## 2022-04-30 NOTE — Progress Notes (Signed)
post void residual= 114

## 2022-07-30 ENCOUNTER — Ambulatory Visit: Payer: Medicaid Other | Admitting: Urology

## 2022-08-06 ENCOUNTER — Ambulatory Visit: Payer: Medicaid Other | Admitting: Urology

## 2022-09-03 ENCOUNTER — Ambulatory Visit: Payer: Medicaid Other | Admitting: Urology

## 2022-09-10 ENCOUNTER — Encounter: Payer: Self-pay | Admitting: Urology

## 2022-09-10 ENCOUNTER — Ambulatory Visit (INDEPENDENT_AMBULATORY_CARE_PROVIDER_SITE_OTHER): Payer: Medicaid Other | Admitting: Urology

## 2022-09-10 VITALS — BP 94/67 | HR 79

## 2022-09-10 DIAGNOSIS — N35016 Post-traumatic urethral stricture, male, overlapping sites: Secondary | ICD-10-CM

## 2022-09-10 DIAGNOSIS — Z87440 Personal history of urinary (tract) infections: Secondary | ICD-10-CM | POA: Diagnosis not present

## 2022-09-10 DIAGNOSIS — R339 Retention of urine, unspecified: Secondary | ICD-10-CM

## 2022-09-10 DIAGNOSIS — N3941 Urge incontinence: Secondary | ICD-10-CM

## 2022-09-10 DIAGNOSIS — Z87442 Personal history of urinary calculi: Secondary | ICD-10-CM

## 2022-09-10 LAB — BLADDER SCAN AMB NON-IMAGING: Scan Result: 155

## 2022-09-10 NOTE — Progress Notes (Signed)
Pt here today for bladder scan. Bladder was scanned and 155 was visualized.   Performed by Marisue Brooklyn, CMA
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
## 2022-09-10 NOTE — Addendum Note (Signed)
Addended by: Darcella Gasman R on: 09/10/2022 10:27 AM   Modules accepted: Orders

## 2023-03-11 ENCOUNTER — Ambulatory Visit (HOSPITAL_COMMUNITY): Admission: RE | Admit: 2023-03-11 | Payer: Medicaid Other | Source: Ambulatory Visit

## 2023-03-11 ENCOUNTER — Encounter: Payer: Self-pay | Admitting: Urology

## 2023-03-11 ENCOUNTER — Ambulatory Visit (INDEPENDENT_AMBULATORY_CARE_PROVIDER_SITE_OTHER): Payer: Medicaid Other | Admitting: Urology

## 2023-03-11 VITALS — BP 103/66 | HR 91 | Ht 68.0 in | Wt 152.0 lb

## 2023-03-11 DIAGNOSIS — N3941 Urge incontinence: Secondary | ICD-10-CM | POA: Diagnosis not present

## 2023-03-11 DIAGNOSIS — N210 Calculus in bladder: Secondary | ICD-10-CM | POA: Diagnosis not present

## 2023-03-11 DIAGNOSIS — Z87442 Personal history of urinary calculi: Secondary | ICD-10-CM | POA: Diagnosis not present

## 2023-03-11 DIAGNOSIS — Z87440 Personal history of urinary (tract) infections: Secondary | ICD-10-CM

## 2023-03-11 DIAGNOSIS — R339 Retention of urine, unspecified: Secondary | ICD-10-CM | POA: Diagnosis not present

## 2023-03-11 LAB — BLADDER SCAN AMB NON-IMAGING: Scan Result: 212
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
## 2023-03-11 NOTE — Progress Notes (Signed)
post void residual=212

## 2023-06-08 ENCOUNTER — Ambulatory Visit (HOSPITAL_COMMUNITY)
Admission: RE | Admit: 2023-06-08 | Discharge: 2023-06-08 | Disposition: A | Discharge status: Home / Self Care | Payer: Medicaid Other | Source: Ambulatory Visit | Attending: Urology | Admitting: Urology

## 2023-06-08 DIAGNOSIS — Z87442 Personal history of urinary calculi: Secondary | ICD-10-CM | POA: Diagnosis present

## 2023-06-08 DIAGNOSIS — N210 Calculus in bladder: Secondary | ICD-10-CM | POA: Insufficient documentation

## 2023-06-18 ENCOUNTER — Telehealth: Payer: Self-pay

## 2023-06-18 NOTE — Telephone Encounter (Signed)
-----   Message from Bjorn Pippin sent at 06/17/2023 11:49 AM EST ----- His renal US looks fine to me but the report is still pending. We could give a prelimary update to the patient and family.  No obstruction or stones that I can see. ----- Message ----- From: SYSTEM Sent: 06/13/2023  12:22 AM EST To: Bjorn Pippin, MD

## 2023-06-18 NOTE — Telephone Encounter (Signed)
Unable to reach facility by phone, I faxed a letter to facility with MD preliminary review.  They are aware we will reach out with office read once available.

## 2023-09-09 ENCOUNTER — Encounter: Payer: Self-pay | Admitting: Urology

## 2023-09-09 ENCOUNTER — Ambulatory Visit: Payer: Medicaid Other | Admitting: Urology

## 2023-09-09 VITALS — BP 119/60 | HR 89

## 2023-09-09 DIAGNOSIS — Z87448 Personal history of other diseases of urinary system: Secondary | ICD-10-CM | POA: Diagnosis not present

## 2023-09-09 DIAGNOSIS — N319 Neuromuscular dysfunction of bladder, unspecified: Secondary | ICD-10-CM

## 2023-09-09 DIAGNOSIS — R3915 Urgency of urination: Secondary | ICD-10-CM | POA: Diagnosis not present

## 2023-09-09 DIAGNOSIS — R339 Retention of urine, unspecified: Secondary | ICD-10-CM | POA: Diagnosis not present

## 2023-09-09 DIAGNOSIS — N3941 Urge incontinence: Secondary | ICD-10-CM

## 2023-09-09 DIAGNOSIS — Z87440 Personal history of urinary (tract) infections: Secondary | ICD-10-CM

## 2023-09-09 DIAGNOSIS — N35016 Post-traumatic urethral stricture, male, overlapping sites: Secondary | ICD-10-CM

## 2023-09-09 DIAGNOSIS — Z87442 Personal history of urinary calculi: Secondary | ICD-10-CM

## 2023-09-09 NOTE — Progress Notes (Signed)
PVR >268
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
## 2024-08-29 ENCOUNTER — Ambulatory Visit (HOSPITAL_COMMUNITY)

## 2024-09-08 ENCOUNTER — Ambulatory Visit: Payer: Medicaid Other | Admitting: Urology
